# Patient Record
Sex: Male | Born: 1955 | Race: White | Hispanic: No | Marital: Married | Smoking: Never smoker
Health system: Southern US, Community
[De-identification: ages and names within clinical notes are randomized; demographics above are authoritative.]

---

## 2021-10-15 ENCOUNTER — Emergency Department (HOSPITAL_COMMUNITY): Payer: Medicare Other

## 2021-10-15 ENCOUNTER — Inpatient Hospital Stay (HOSPITAL_COMMUNITY)
Admission: EM | Admit: 2021-10-15 | Discharge: 2021-10-27 | DRG: 025 | Disposition: A | Discharge status: Rehabilitation Facility | Payer: Medicare Other | Attending: Internal Medicine | Admitting: Internal Medicine

## 2021-10-15 DIAGNOSIS — Z7951 Long term (current) use of inhaled steroids: Secondary | ICD-10-CM

## 2021-10-15 DIAGNOSIS — I480 Paroxysmal atrial fibrillation: Secondary | ICD-10-CM

## 2021-10-15 DIAGNOSIS — Z9861 Coronary angioplasty status: Secondary | ICD-10-CM

## 2021-10-15 DIAGNOSIS — Z6839 Body mass index (BMI) 39.0-39.9, adult: Secondary | ICD-10-CM

## 2021-10-15 DIAGNOSIS — G9389 Other specified disorders of brain: Secondary | ICD-10-CM | POA: Diagnosis not present

## 2021-10-15 DIAGNOSIS — R748 Abnormal levels of other serum enzymes: Secondary | ICD-10-CM | POA: Diagnosis present

## 2021-10-15 DIAGNOSIS — E119 Type 2 diabetes mellitus without complications: Secondary | ICD-10-CM

## 2021-10-15 DIAGNOSIS — R350 Frequency of micturition: Secondary | ICD-10-CM | POA: Diagnosis present

## 2021-10-15 DIAGNOSIS — G8929 Other chronic pain: Secondary | ICD-10-CM | POA: Diagnosis present

## 2021-10-15 DIAGNOSIS — Z7901 Long term (current) use of anticoagulants: Secondary | ICD-10-CM

## 2021-10-15 DIAGNOSIS — G40802 Other epilepsy, not intractable, without status epilepticus: Secondary | ICD-10-CM | POA: Diagnosis not present

## 2021-10-15 DIAGNOSIS — R410 Disorientation, unspecified: Secondary | ICD-10-CM

## 2021-10-15 DIAGNOSIS — E669 Obesity, unspecified: Secondary | ICD-10-CM | POA: Diagnosis present

## 2021-10-15 DIAGNOSIS — R4701 Aphasia: Secondary | ICD-10-CM | POA: Diagnosis not present

## 2021-10-15 DIAGNOSIS — I872 Venous insufficiency (chronic) (peripheral): Secondary | ICD-10-CM | POA: Diagnosis present

## 2021-10-15 DIAGNOSIS — C712 Malignant neoplasm of temporal lobe: Principal | ICD-10-CM | POA: Diagnosis present

## 2021-10-15 DIAGNOSIS — Z79899 Other long term (current) drug therapy: Secondary | ICD-10-CM

## 2021-10-15 DIAGNOSIS — I10 Essential (primary) hypertension: Secondary | ICD-10-CM

## 2021-10-15 DIAGNOSIS — G9341 Metabolic encephalopathy: Secondary | ICD-10-CM | POA: Diagnosis present

## 2021-10-15 DIAGNOSIS — Z91041 Radiographic dye allergy status: Secondary | ICD-10-CM

## 2021-10-15 DIAGNOSIS — Z7984 Long term (current) use of oral hypoglycemic drugs: Secondary | ICD-10-CM

## 2021-10-15 DIAGNOSIS — T380X5A Adverse effect of glucocorticoids and synthetic analogues, initial encounter: Secondary | ICD-10-CM | POA: Diagnosis present

## 2021-10-15 DIAGNOSIS — Z7902 Long term (current) use of antithrombotics/antiplatelets: Secondary | ICD-10-CM

## 2021-10-15 DIAGNOSIS — Z91013 Allergy to seafood: Secondary | ICD-10-CM

## 2021-10-15 DIAGNOSIS — Z91199 Patient's noncompliance with other medical treatment and regimen due to unspecified reason: Secondary | ICD-10-CM

## 2021-10-15 DIAGNOSIS — E876 Hypokalemia: Secondary | ICD-10-CM

## 2021-10-15 DIAGNOSIS — E1165 Type 2 diabetes mellitus with hyperglycemia: Secondary | ICD-10-CM | POA: Diagnosis present

## 2021-10-15 DIAGNOSIS — Z9981 Dependence on supplemental oxygen: Secondary | ICD-10-CM

## 2021-10-15 DIAGNOSIS — I2510 Atherosclerotic heart disease of native coronary artery without angina pectoris: Secondary | ICD-10-CM

## 2021-10-15 DIAGNOSIS — J449 Chronic obstructive pulmonary disease, unspecified: Secondary | ICD-10-CM

## 2021-10-15 DIAGNOSIS — J9611 Chronic respiratory failure with hypoxia: Secondary | ICD-10-CM | POA: Diagnosis present

## 2021-10-15 DIAGNOSIS — U071 COVID-19: Secondary | ICD-10-CM

## 2021-10-15 DIAGNOSIS — G40909 Epilepsy, unspecified, not intractable, without status epilepticus: Secondary | ICD-10-CM | POA: Diagnosis present

## 2021-10-15 DIAGNOSIS — Z79891 Long term (current) use of opiate analgesic: Secondary | ICD-10-CM

## 2021-10-15 DIAGNOSIS — R451 Restlessness and agitation: Secondary | ICD-10-CM

## 2021-10-15 DIAGNOSIS — R4182 Altered mental status, unspecified: Secondary | ICD-10-CM

## 2021-10-15 DIAGNOSIS — Z955 Presence of coronary angioplasty implant and graft: Secondary | ICD-10-CM

## 2021-10-15 DIAGNOSIS — R059 Cough, unspecified: Secondary | ICD-10-CM

## 2021-10-15 DIAGNOSIS — Z781 Physical restraint status: Secondary | ICD-10-CM

## 2021-10-15 DIAGNOSIS — E785 Hyperlipidemia, unspecified: Secondary | ICD-10-CM

## 2021-10-15 DIAGNOSIS — G936 Cerebral edema: Secondary | ICD-10-CM | POA: Diagnosis present

## 2021-10-15 DIAGNOSIS — R001 Bradycardia, unspecified: Secondary | ICD-10-CM | POA: Diagnosis present

## 2021-10-15 DIAGNOSIS — R600 Localized edema: Secondary | ICD-10-CM | POA: Diagnosis present

## 2021-10-15 DIAGNOSIS — Z86718 Personal history of other venous thrombosis and embolism: Secondary | ICD-10-CM

## 2021-10-15 DIAGNOSIS — G928 Other toxic encephalopathy: Secondary | ICD-10-CM | POA: Diagnosis present

## 2021-10-15 LAB — RAPID URINE DRUG SCREEN, HOSP PERFORMED
Amphetamines: NOT DETECTED
Barbiturates: NOT DETECTED
Benzodiazepines: NOT DETECTED
Cocaine: NOT DETECTED
Opiates: NOT DETECTED
Tetrahydrocannabinol: NOT DETECTED

## 2021-10-15 LAB — URINALYSIS, ROUTINE W REFLEX MICROSCOPIC
Bilirubin Urine: NEGATIVE
Glucose, UA: >=500 mg/dL — AB
Ketones, ur: NEGATIVE mg/dL
Leukocytes,Ua: NEGATIVE
Nitrite: NEGATIVE
Protein, ur: NEGATIVE mg/dL
Specific Gravity, Urine: 1.01 (ref 1.005–1.030)
pH: 6.5 (ref 5.0–8.0)

## 2021-10-15 LAB — I-STAT CHEM 8, ED
BUN: 17 mg/dL (ref 8–23)
Calcium, Ion: 1.15 mmol/L (ref 1.15–1.40)
Chloride: 99 mmol/L (ref 98–111)
Creatinine, Ser: 0.9 mg/dL (ref 0.61–1.24)
Glucose, Bld: 226 mg/dL — ABNORMAL HIGH (ref 70–99)
HCT: 41 % (ref 39.0–52.0)
Hemoglobin: 13.9 g/dL (ref 13.0–17.0)
Potassium: 4 mmol/L (ref 3.5–5.1)
Sodium: 138 mmol/L (ref 135–145)
TCO2: 31 mmol/L (ref 22–32)

## 2021-10-15 LAB — ETHANOL: Alcohol, Ethyl (B): <10 mg/dL (ref <10)

## 2021-10-15 LAB — URINALYSIS, MICROSCOPIC (REFLEX)
Bacteria, UA: NONE SEEN
Squamous Epithelial / HPF: NONE SEEN (ref 0–5)

## 2021-10-15 LAB — COMPREHENSIVE METABOLIC PANEL
ALT: 42 U/L (ref 0–44)
AST: 44 U/L — ABNORMAL HIGH (ref 15–41)
Albumin: 3.6 g/dL (ref 3.5–5.0)
Alkaline Phosphatase: 48 U/L (ref 38–126)
Anion gap: 10 (ref 5–15)
BUN: 15 mg/dL (ref 8–23)
CO2: 28 mmol/L (ref 22–32)
Calcium: 9.1 mg/dL (ref 8.9–10.3)
Chloride: 99 mmol/L (ref 98–111)
Creatinine, Ser: 0.91 mg/dL (ref 0.61–1.24)
GFR, Estimated: >60 mL/min (ref >60)
Glucose, Bld: 230 mg/dL — ABNORMAL HIGH (ref 70–99)
Potassium: 4 mmol/L (ref 3.5–5.1)
Sodium: 137 mmol/L (ref 135–145)
Total Bilirubin: 0.4 mg/dL (ref 0.3–1.2)
Total Protein: 7.2 g/dL (ref 6.5–8.1)

## 2021-10-15 LAB — APTT: aPTT: 33 seconds (ref 24–36)

## 2021-10-15 LAB — PROTIME-INR
INR: 1.2 (ref 0.8–1.2)
Prothrombin Time: 15 seconds (ref 11.4–15.2)

## 2021-10-15 MED ORDER — LEVETIRACETAM 500 MG PO TABS: 500.0000 mg | ORAL_TABLET | Freq: Two times a day (BID) | ORAL | Status: DC

## 2021-10-15 MED ORDER — SODIUM CHLORIDE 0.9 % IV SOLN
3000.0000 mg | Freq: Once | INTRAVENOUS | Status: AC
  Administered 2021-10-15: 3000 mg via INTRAVENOUS
  Filled 2021-10-15: qty 30

## 2021-10-15 MED ORDER — IOHEXOL 350 MG/ML SOLN
100.0000 mL | Freq: Once | INTRAVENOUS | Status: AC | PRN
  Administered 2021-10-15: 100 mL via INTRAVENOUS

## 2021-10-15 NOTE — ED Provider Notes (Signed)
[facility] EMERGENCY DEPARTMENT Provider Note   CSN: [id number] Arrival date & time: 10/15/21  2136     History  Chief Complaint  Patient presents with   Code Stroke    Eugune Wood. is a 66 y.o. male.  Level 5 caveat due to altered mental status.  Code stroke upon arrival.  Per EMS patient with difficulty with speech about an hour ago.  For some reason family gave the patient trazodone, narcotic, nitro but did not help.  Patient arrives sleepy and unable to participate in exam.  Wife was able to confirm same story upon arrival to the ED.  Patient on 2 L of oxygen.  Wife states that he was complaining of headaches as well.  Blood pressure was elevated with EMS.  The history is provided by the EMS personnel and a caregiver.  Neurologic Problem This is a new problem. The current episode started 1 to 2 hours ago. The problem occurs constantly. The problem has not changed since onset.Nothing aggravates the symptoms. Nothing relieves the symptoms. He has tried nothing for the symptoms. The treatment provided no relief.      Home Medications Prior to Admission medications   Medication Sig Start Date End Date Taking? Authorizing Provider  acetaminophen (TYLENOL) 500 MG tablet Take 500 mg by mouth every 6 (six) hours as needed for headache.   Yes [provider]  albuterol (VENTOLIN HFA) 108 (90 Base) MCG/ACT inhaler Inhale 1-2 puffs into the lungs every 6 (six) hours as needed for wheezing or shortness of breath.   Yes [provider]  apixaban (ELIQUIS) 5 MG TABS tablet Take 5 mg by mouth 2 (two) times daily.   Yes [provider]  atorvastatin (LIPITOR) 40 MG tablet Take 40 mg by mouth daily.   Yes [provider]  budesonide (PULMICORT) 0.5 MG/2ML nebulizer solution Take 0.5 mg by nebulization 2 (two) times daily as needed (wheezing).   Yes [provider]  Cholecalciferol (VITAMIN D3 GUMMIES PO) Take 2 tablets by  mouth every evening.   Yes [provider]  clopidogrel (PLAVIX) 75 MG tablet Take 75 mg by mouth daily.   Yes [provider]  famotidine (PEPCID) 20 MG tablet Take 20 mg by mouth daily.   Yes [provider]  fluticasone (FLONASE) 50 MCG/ACT nasal spray Place 2 sprays into both nostrils in the morning and at bedtime.   Yes [provider]  FLUTICASONE FUROATE-VILANTEROL IN Inhale 1 puff into the lungs daily.   Yes [provider]  Fluticasone-Umeclidin-Vilant (TRELEGY ELLIPTA IN) Inhale 1 puff into the lungs daily.   Yes [provider]  gabapentin (NEURONTIN) 300 MG capsule Take 300 mg by mouth 3 (three) times daily.   Yes [provider]  gemfibrozil (LOPID) 600 MG tablet Take 600 mg by mouth 2 (two) times daily before a meal.   Yes [provider]  glipiZIDE (GLUCOTROL XL) 5 MG 24 hr tablet Take 5 mg by mouth in the morning and at bedtime.   Yes [provider]  ipratropium-albuterol (DUONEB) 0.5-2.5 (3) MG/3ML SOLN Take 3 mLs by nebulization every 6 (six) hours as needed (shortness of breath).   Yes [provider]  isosorbide mononitrate (IMDUR) 30 MG 24 hr tablet Take 30 mg by mouth daily.   Yes [provider]  losartan-hydrochlorothiazide (HYZAAR) 100-12.5 MG tablet Take 1 tablet by mouth daily.   Yes [provider]  metoprolol tartrate (LOPRESSOR) 100 MG tablet  Take 100 mg by mouth 2 (two) times daily.   Yes [provider]  montelukast (SINGULAIR) 10 MG tablet Take 10 mg by mouth every evening.   Yes [provider]  Multiple Vitamins-Minerals (MENS MULTIVITAMIN PO) Take 1 tablet by mouth daily.   Yes [provider]  nitroGLYCERIN (NITROSTAT) 0.4 MG SL tablet Place 0.4 mg under the tongue every 5 (five) minutes as needed for chest pain.   Yes [provider]  oxyCODONE-acetaminophen (PERCOCET) 10-325 MG tablet Take 1 tablet by mouth 4 (four)  times daily as needed for pain.   Yes [provider]  pantoprazole (PROTONIX) 40 MG tablet Take 40 mg by mouth daily.   Yes [provider]  tiZANidine (ZANAFLEX) 4 MG tablet Take 4 mg by mouth every 8 (eight) hours as needed for muscle spasms.   Yes [provider]  torsemide (DEMADEX) 20 MG tablet Take 20 mg by mouth daily.   Yes [provider]  traZODone (DESYREL) 100 MG tablet Take 200 mg by mouth at bedtime as needed for sleep.   Yes [provider]      Allergies    Iodine and Shellfish allergy    Review of Systems   Review of Systems  Physical Exam Updated Vital Signs BP (!) 147/74    Pulse 79    Temp 97.8 F (36.6 C) (Oral)    Resp 17    Wt 123.6 kg    SpO2 97%  Physical Exam Vitals and nursing note reviewed.  Constitutional:      General: He is not in acute distress.    Appearance: He is well-developed. He is ill-appearing.  HENT:     Head: Normocephalic and atraumatic.     Nose: Nose normal.  Eyes:     Extraocular Movements: Extraocular movements intact.     Conjunctiva/sclera: Conjunctivae normal.     Pupils: Pupils are equal, round, and reactive to light.  Cardiovascular:     Rate and Rhythm: Normal rate and regular rhythm.     Pulses: Normal pulses.     Heart sounds: Normal heart sounds. No murmur heard. Pulmonary:     Effort: Pulmonary effort is normal. No respiratory distress.     Breath sounds: Normal breath sounds.  Abdominal:     Palpations: Abdomen is soft.     Tenderness: There is no abdominal tenderness.  Musculoskeletal:        General: No swelling.     Cervical back: Normal range of motion and neck supple.  Skin:    General: Skin is warm and dry.     Capillary Refill: Capillary refill takes less than 2 seconds.  Neurological:     General: No focal deficit present.     Mental Status: He is alert.     Comments: Patient appears to move all extremities to noxious stimuli, easily arousable, unable to  follow commands, pupils are equal and reactive  Psychiatric:        Mood and Affect: Mood normal.    ED Results / Procedures / Treatments   Labs (all labs ordered are listed, but only abnormal results are displayed) Labs Reviewed  DIFFERENTIAL - Abnormal; Notable for the following components:      Result Value   Abs Immature Granulocytes 0.09 (*)    All other components within normal limits  COMPREHENSIVE METABOLIC PANEL - Abnormal; Notable for the following components:   Glucose, Bld 230 (*)    AST 44 (*)  All other components within normal limits  URINALYSIS, ROUTINE W REFLEX MICROSCOPIC - Abnormal; Notable for the following components:   Color, Urine STRAW (*)    Glucose, UA >=500 (*)    Hgb urine dipstick TRACE (*)    All other components within normal limits  I-STAT CHEM 8, ED - Abnormal; Notable for the following components:   Glucose, Bld 226 (*)    All other components within normal limits  RESP PANEL BY RT-PCR (FLU A&B, COVID) ARPGX2  ETHANOL  PROTIME-INR  APTT  CBC  RAPID URINE DRUG SCREEN, HOSP PERFORMED  URINALYSIS, MICROSCOPIC (REFLEX)    EKG EKG Interpretation  Date/Time:  Thursday October 15 2021 22:33:14 EST Ventricular Rate:  84 PR Interval:  208 QRS Duration: 90 QT Interval:  384 QTC Calculation: 454 R Axis:   -64 Text Interpretation: Sinus rhythm Inferior infarct, old Consider anterior infarct Baseline wander in lead(s) V1 Confirmed by Lennice Sites ([id number]) on 10/15/2021 11:41:33 PM  Radiology CT CEREBRAL PERFUSION W CONTRAST  Result Date: 10/15/2021 CLINICAL DATA:  Neuro deficit, acute, stroke suspected. EXAM: CT ANGIOGRAPHY HEAD AND NECK CT PERFUSION BRAIN TECHNIQUE: Multidetector CT imaging of the head and neck was performed using the standard protocol during bolus administration of intravenous contrast. Multiplanar CT image reconstructions and MIPs were obtained to evaluate the vascular anatomy. Carotid stenosis measurements (when applicable)  are obtained utilizing NASCET criteria, using the distal internal carotid diameter as the denominator. Multiphase CT imaging of the brain was performed following IV bolus contrast injection. Subsequent parametric perfusion maps were calculated using RAPID software. RADIATION DOSE REDUCTION: This exam was performed according to the departmental dose-optimization program which includes automated exposure control, adjustment of the mA and/or kV according to patient size and/or use of iterative reconstruction technique. CONTRAST:  Not annotated COMPARISON:  Head CT earlier same day FINDINGS: CTA NECK FINDINGS Aortic arch: Aortic atherosclerosis, mild. Right carotid system: Common carotid artery widely patent to the bifurcation. Carotid bifurcation is normal. Left carotid system: Common carotid artery widely patent to the bifurcation. Carotid bifurcation is normal. Vertebral arteries: Vertebral artery origins are poorly seen. Beyond the origins, the vessels are patent through the cervical region to the foramen magnum. Skeleton: Ordinary cervical spondylosis. Other neck: No mass or lymphadenopathy. Upper chest: Small amount of dependent pleural fluid and dependent pulmonary atelectasis. Review of the MIP images confirms the above findings CTA HEAD FINDINGS Anterior circulation: Both internal carotid arteries widely patent through the skull base and siphon regions. The anterior and middle cerebral vessels are patent. No large vessel occlusion. Posterior circulation: Both vertebral arteries are widely patent to the basilar. No basilar stenosis. No large vessel occlusion. Venous sinuses: Patent and normal. Anatomic variants: None significant. Review of the MIP images confirms the above findings CT Brain Perfusion Findings: ASPECTS: 9 CBF (<30%) Volume: 46mL Perfusion (Tmax>6.0s) volume: 31mL Mismatch Volume: 79mL Infarction Location:None There is actual hyperemia in the left posterior temporal lobe, with a peripherally  enhancing mass lesion visible, measuring about 2.5 cm in size. IMPRESSION: No large vessel occlusion. 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 Electronically Signed   By: Nelson Chimes M.D.   On: 10/15/2021 22:35   CT HEAD CODE STROKE WO CONTRAST  Result Date: 10/15/2021 CLINICAL DATA:  Code stroke. Neuro deficit, acute, stroke suspected. EXAM: CT HEAD WITHOUT CONTRAST TECHNIQUE: Contiguous axial images were obtained from the base of the skull through the vertex without intravenous contrast. RADIATION DOSE REDUCTION: This exam  was performed according to the departmental dose-optimization program which includes automated exposure control, adjustment of the mA and/or kV according to patient size and/or use of iterative reconstruction technique. COMPARISON:  None. FINDINGS: Brain: The brainstem and cerebellum are normal. Right cerebral hemisphere is normal. There is a 3-4 cm region of abnormal low-density and mild swelling in the left mid to posterior temporal lobe. This could represent a early subacute infarction mass lesion is not excluded. There may be some internal petechial blood products but there is no frank hematoma. Elsewhere the brain appears normal. No hydrocephalus or extra-axial collection. Vascular: No abnormal vascular finding. Skull: Negative Sinuses/Orbits: Clear/normal Other: None ASPECTS (Hanson Stroke Program Early CT Score) - Ganglionic level infarction (caudate, lentiform nuclei, internal capsule, insula, M1-M3 cortex): 6 - Supraganglionic infarction (M4-M6 cortex): 3 Total score (0-10 with 10 being normal): 9 IMPRESSION: 1. Question 3-4 cm region of subacute infarction versus possibly a mass lesion in the left posterior temporal lobe. There may be some internal petechial blood products but there is no frank hematoma. 2. ASPECTS is 9 3. Case discussed by telephone with Dr. Lorrin Goodell at 2200 hours. Electronically Signed   By: Nelson Chimes M.D.   On:  10/15/2021 22:06   CT ANGIO HEAD NECK W WO CM (CODE STROKE)  Result Date: 10/15/2021 CLINICAL DATA:  Neuro deficit, acute, stroke suspected. EXAM: CT ANGIOGRAPHY HEAD AND NECK CT PERFUSION BRAIN TECHNIQUE: Multidetector CT imaging of the head and neck was performed using the standard protocol during bolus administration of intravenous contrast. Multiplanar CT image reconstructions and MIPs were obtained to evaluate the vascular anatomy. Carotid stenosis measurements (when applicable) are obtained utilizing NASCET criteria, using the distal internal carotid diameter as the denominator. Multiphase CT imaging of the brain was performed following IV bolus contrast injection. Subsequent parametric perfusion maps were calculated using RAPID software. RADIATION DOSE REDUCTION: This exam was performed according to the departmental dose-optimization program which includes automated exposure control, adjustment of the mA and/or kV according to patient size and/or use of iterative reconstruction technique. CONTRAST:  Not annotated COMPARISON:  Head CT earlier same day FINDINGS: CTA NECK FINDINGS Aortic arch: Aortic atherosclerosis, mild. Right carotid system: Common carotid artery widely patent to the bifurcation. Carotid bifurcation is normal. Left carotid system: Common carotid artery widely patent to the bifurcation. Carotid bifurcation is normal. Vertebral arteries: Vertebral artery origins are poorly seen. Beyond the origins, the vessels are patent through the cervical region to the foramen magnum. Skeleton: Ordinary cervical spondylosis. Other neck: No mass or lymphadenopathy. Upper chest: Small amount of dependent pleural fluid and dependent pulmonary atelectasis. Review of the MIP images confirms the above findings CTA HEAD FINDINGS Anterior circulation: Both internal carotid arteries widely patent through the skull base and siphon regions. The anterior and middle cerebral vessels are patent. No large vessel  occlusion. Posterior circulation: Both vertebral arteries are widely patent to the basilar. No basilar stenosis. No large vessel occlusion. Venous sinuses: Patent and normal. Anatomic variants: None significant. Review of the MIP images confirms the above findings CT Brain Perfusion Findings: ASPECTS: 9 CBF (<30%) Volume: 65mL Perfusion (Tmax>6.0s) volume: 67mL Mismatch Volume: 22mL Infarction Location:None There is actual hyperemia in the left posterior temporal lobe, with a peripherally enhancing mass lesion visible, measuring about 2.5 cm in size. IMPRESSION: No large vessel occlusion. 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 Electronically Signed   By: Nelson Chimes M.D.   On: 10/15/2021 22:35  Procedures .Critical Care Performed by: Lennice Sites, DO Authorized by: Lennice Sites, DO   Critical care provider statement:    Critical care time (minutes):  45   Critical care was necessary to treat or prevent imminent or life-threatening deterioration of the following conditions:  CNS failure or compromise   Critical care was time spent personally by me on the following activities:  Blood draw for specimens, development of treatment plan with patient or surrogate, discussions with consultants, discussions with primary provider, evaluation of patient's response to treatment, examination of patient, obtaining history from patient or surrogate, ordering and performing treatments and interventions, ordering and review of laboratory studies, ordering and review of radiographic studies, pulse oximetry, re-evaluation of patient's condition and review of old charts   Care discussed with: admitting provider      Medications Ordered in ED Medications  levETIRAcetam (KEPPRA) tablet 500 mg (has no administration in time range)  levETIRAcetam (KEPPRA) 3,000 mg in sodium chloride 0.9 % 250 mL IVPB (0 mg Intravenous Stopped 10/15/21 2336)  iohexol (OMNIPAQUE) 350 MG/ML  injection 100 mL (100 mLs Intravenous Contrast Given 10/15/21 2204)    ED Course/ Medical Decision Making/ A&P                           Medical Decision Making Risk Decision regarding hospitalization.   Artie Earmon Sherrow Sr. is a 66 year old male with history of CAD on Plavix, blood clot in his leg on Eliquis, hypertension, high cholesterol, diabetes.  Arrives as a code stroke for aphasia that started about an hour ago.  This is somewhat complicated by that patient took trazodone, narcotic pain medicine and nitroglycerin shortly after experiencing the symptoms.  Patient is somnolent but easily arousable.  He is moves all his extremities.  Pupils are equal and reactive.  Opens eyes spontaneously.  Unable to fully participate in neurological exam.  Neurology at the bedside upon arrival to the ED.  Taken directly for head CT.  Per review of CT, I do not see any head bleed however per radiology report there is questionable 3 to 4 cm region of subacute infarction versus possibly a mass lesion in the left posterior temporal lobe.  No frank hematoma.On CT perfusion study there is hyperemic mass in the left posterior temporal lobe measuring about 2.5 cm per radiology report.  Differential diagnosis now includes possible brain mass versus seizure episode.  Lab work including CBC, BMP, EKG has been ordered.  Neurology not recommending tPA given these findings.  Patient to be loaded with IV Keppra for possible seizure.  Does not appear to be having any seizure activity now but possibly that he is postictal.  We will get MRI to furtherInvestigate and admit to medicine for further care.  My review and interpretation the labs show that there are no significant findings.  Mild elevation in blood sugar but no significant anemia, electrolyte abnormality, kidney injury.  My review and interpretation of EKG shows sinus rhythm.  No ischemic changes.  Improving while on Keppra.  No seizure-like activities.  Becoming more  awake but still has some speech difficulty.  To be admitted to medicine for further care.  This chart was dictated using voice recognition software.  Despite best efforts to proofread,  errors can occur which can change the documentation meaning.         Final Clinical Impression(s) / ED Diagnoses Final diagnoses:  Brain mass  Aphasia  Altered mental status, unspecified altered  mental status type    Rx / DC Orders ED Discharge Orders     None         Lennice Sites, DO 10/16/21 3552

## 2021-10-15 NOTE — Code Documentation (Signed)
Stroke Response Nurse Documentation Code Documentation  Lavell Supple Sr. is a 66 y.o. male arriving to [facility] ED via [facility] on 2/16 with past medical hx of large clot in leg. On clopidogrel 75 mg daily and Eliquis (apixaban) daily. Code stroke was activated by EMS.   Patient from home where he was LKW at [place] and now complaining of aphasia .  Stroke team at the bedside on patient arrival. Labs drawn and patient cleared for CT by Dr. Ernst Bowler. Patient to CT with team. NIHSS 22, see documentation for details and code stroke times. Patient with decreased LOC, disoriented, not following commands, bilateral arm weakness, bilateral leg weakness, Global aphasia , and dysarthria  on exam. The following imaging was completed:  CT, CTA head and neck and CTP. Patient is not a candidate for IV Thrombolytic due to Eliquis. Patient is not a candidate for IR due to No LVO.    Bedside handoff with ED RN Hennie Duos.    Madelynn Done  Rapid Response RN

## 2021-10-15 NOTE — Consult Note (Addendum)
NEUROLOGY CONSULTATION NOTE   Date of service: October 15, 2021 Patient Name: Jeff Breach Sr. MRN:  [id number] DOB:  1956-08-26 Reason for consult: "aphasia and somnolence" Requesting Provider: Lennice Sites, DO _ _ _   _ __   _ __ _ _  __ __   _ __   __ _  History of Present Illness  Jeff Schue Sr. is a 66 y.o. 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  Patient is unable to provide any meaningful history secondary to somnolence.  Patient's wife who I spoke to over phone reports that patient was doing fine at 1900 on 10/15/2021.  Out of nowhere, he appeared to be having trouble with language.  He appeared very frustrated and was unable to speak, getting stuck on several words.  This happened around [place] on 10/15/2021.  He was also feeling very off.  She gave him nitro thinking this is coming from his heart.  Patient asked for some water but had a lot of trouble getting his words out.  While wife went to get some water, he took 2 trazodone and also maybe a Percocet.  Wife called EMS when patient was very somnolent and he was brought in as a stroke code.  On arrival, patient is somnolent, responds to with any question and requires a sternal rub to stay awake.  CT head with a subacute infarct versus possible mass in the left posterior temporal lobe.  CT angio with no large vessel occlusion and on CT perfusion, noted to have a hyperemic mass in the left posterior temporal lobe measuring about 2.5 cm in size.  LKW: 1947 on 10/15/21 mRS: 0 tNKASE: not offered, he is on Eliquis and took it this morning. Thrombectomy: not offered, no LVO. NIHSS components Score: Comment  1a Level of Conscious 0[]  1[]  2[]  3[x]      1b LOC Questions 0[]  1[]  2[x]       1c LOC Commands 0[]  1[]  2[x]       2 Best Gaze 0[x]  1[]  2[]       3 Visual 0[x]  1[]  2[]  3[]      4 Facial Palsy 0[x]  1[]  2[]  3[]      5a  Motor Arm - left 0[]  1[]  2[x]  3[]  4[]  UN[]    5b Motor Arm - Right 0[]  1[]  2[x]  3[]  4[]  UN[]    6a Motor Leg - Left 0[]  1[]  2[]  3[x]  4[]  UN[]    6b Motor Leg - Right 0[]  1[]  2[]  3[x]  4[]  UN[]    7 Limb Ataxia 0[x]  1[]  2[]  3[]  UN[]     8 Sensory 0[x]  1[]  2[]  UN[]      9 Best Language 0[]  1[]  2[]  3[x]      10 Dysarthria 0[]  1[]  2[x]  UN[]      11 Extinct. and Inattention 0[x]  1[]  2[]       TOTAL: 22     ROS   Unable to obtain detailed review of systems secondary to somnolence and encephalopathy.  Past History  No past medical history on file.  No family history on file. Social History   Socioeconomic History   Marital status: Not on file    Spouse name: Not on file   Number of children: Not on file   Years of education: Not on file   Highest education level: Not on file  Occupational History   Not on file  Tobacco Use   Smoking status: Not on file   Smokeless tobacco:  Not on file  Substance and Sexual Activity   Alcohol use: Not on file   Drug use: Not on file   Sexual activity: Not on file  Other Topics Concern   Not on file  Social History Narrative   Not on file   Social Determinants of Health   Financial Resource Strain: Not on file  Food Insecurity: Not on file  Transportation Needs: Not on file  Physical Activity: Not on file  Stress: Not on file  Social Connections: Not on file   Allergies  Allergen Reactions   Iodine Anaphylaxis   Shellfish Allergy Anaphylaxis    Medications  (Not in a hospital admission)    Vitals   Vitals:   10/15/21 2100 10/15/21 2245 10/15/21 2247  BP:  (!) 167/84 (!) 167/84  Pulse:  83 83  Resp:  13 13  Temp:   97.8 F (36.6 C)  TempSrc:   Oral  SpO2:  97% 97%  Weight: 123.6 kg       There is no height or weight on file to calculate BMI.  Physical Exam   General: Laying comfortably in bed; in no acute distress.  HENT: Normal oropharynx and mucosa. Normal external appearance of ears and nose.  Neck: Supple, no pain or  tenderness  CV: No JVD. No peripheral edema.  Pulmonary: Symmetric Chest rise. Normal respiratory effort.  Abdomen: Soft to touch, non-tender.  Ext: No cyanosis, edema, or deformity  Skin: No rash. Normal palpation of skin.   Musculoskeletal: Normal digits and nails by inspection. No clubbing.   Neurologic Examination  Mental status/Cognition: Somnolent, briefly opens eyes for a couple seconds to tactile stimulation or to sternal rub.  Does not respond to orientation questions and very poor attention. Speech/language: responds with yeah and okay to any question. Somnolence precludes detailed speech evaluation. Cranial nerves:   CN II Pupils equal and reactive to light, unable to assess for visual field deficit.   CN III,IV,VI EOM intact to dolls eyes, no gaze preference or deviation, no nystagmus   CN V Corneals intact BL   CN VII no asymmetry, no nasolabial fold flattening   CN VIII Does not turn head to speech   CN IX & X Unable to assess.,   CN XI Head is midline   CN XII midline tongue but does not protrude on command.   Motor/sensory:  Muscle bulk: normal, tone normal  Unable to do detailed strength testing secondary to somnolence.  He is spontaneously moving all arms and legs and localizes in bilateral upper extremity and withdraws to Babinski in bilateral lower extremities.  Reflexes:  Right Left Comments  Pectoralis      Biceps (C5/6) 1 1 Reflexes were limited by body habitus  Brachioradialis (C5/6) 1 1    Triceps (C6/7) 1 1    Patellar (L3/4) 1 1    Achilles (S1)      Hoffman      Plantar     Jaw jerk    Coordination/Complex Motor:  Unable to assess with no obvious ataxia noted. - Gait: Deferred for patient safety.  Labs   CBC:  Recent Labs  Lab 10/15/21 2148  HGB 13.9  HCT 82.9    Basic Metabolic Panel:  Lab Results  Component Value Date   NA 138 10/15/2021   K 4.0 10/15/2021   CO2 28 10/15/2021   GLUCOSE 226 (H) 10/15/2021   BUN 17 10/15/2021    CREATININE 0.90 10/15/2021   CALCIUM 9.1 10/15/2021  GFRNONAA >60 10/15/2021   Lipid Panel: No results found for: LDLCALC HgbA1c: No results found for: HGBA1C Urine Drug Screen: No results found for: LABOPIA, COCAINSCRNUR, LABBENZ, AMPHETMU, THCU, LABBARB  Alcohol Level     Component Value Date/Time   ETH <10 10/15/2021 2138    CT Head without contrast(Personally reviewed): Left posterior temporal lobe subacute infarct versus mass.  CT angio Head and Neck with contrast(Personally reviewed): No LVO  CT perfusion [personally reviewed]: The noted left posterior temporal lobe lesion appears hyperemic.  MRI Brain: pending  rEEG:  pending  Impression   Jeff Selsor Sr. is a 66 y.o. 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 Found to have a hyperemic left posterior temporal lobe lesion concerning for a potential mass. I suspect that he probably had epileptic aphasia vs seizure. He did take trazodone x 2 and percocet but could also be post ictal. No obvious clinical seizure on my evaluation but I will load him up with Keppra and get a MRI brain with and without contrast and routine EEG for further evaluation.  Impression: - Brain mass - Epileptic aphasia.  Recommendations  - MRI Brain with and without contrast - routine EEG - Keppra 3gram IV once(20mg /Kg load), followed by Keppra 500mg  BID - Neurology will continue to follow along.  Plan discussed with Dr. Ronnald Nian. ______________________________________________________________________  This patient is critically ill and at significant risk of neurological worsening, death and care requires constant monitoring of vital signs, hemodynamics,respiratory and cardiac monitoring, neurological assessment, discussion with family, other specialists and medical decision making of high complexity. I spent 35 minutes of  neurocritical care time  in the care of  this patient. This was time spent independent of any time provided by nurse practitioner or PA.  Donnetta Simpers [facility] Pager Number [phone] 10/15/2021  11:14 PM   Thank you for the opportunity to take part in the care of this patient. If you have any further questions, please contact the neurology consultation attending.  Signed,  [place] Pager Number [phone] _ _ _   _ __   _ __ _ _  __ __   _ __   __ _

## 2021-10-16 ENCOUNTER — Observation Stay (HOSPITAL_COMMUNITY): Payer: Medicare Other

## 2021-10-16 ENCOUNTER — Encounter (HOSPITAL_COMMUNITY): Payer: Self-pay | Admitting: Internal Medicine

## 2021-10-16 ENCOUNTER — Inpatient Hospital Stay (HOSPITAL_COMMUNITY): Payer: Medicare Other

## 2021-10-16 ENCOUNTER — Other Ambulatory Visit: Payer: Self-pay

## 2021-10-16 DIAGNOSIS — E785 Hyperlipidemia, unspecified: Secondary | ICD-10-CM | POA: Diagnosis present

## 2021-10-16 DIAGNOSIS — I10 Essential (primary) hypertension: Secondary | ICD-10-CM

## 2021-10-16 DIAGNOSIS — Z781 Physical restraint status: Secondary | ICD-10-CM | POA: Diagnosis not present

## 2021-10-16 DIAGNOSIS — E119 Type 2 diabetes mellitus without complications: Secondary | ICD-10-CM

## 2021-10-16 DIAGNOSIS — G936 Cerebral edema: Secondary | ICD-10-CM | POA: Diagnosis present

## 2021-10-16 DIAGNOSIS — I480 Paroxysmal atrial fibrillation: Secondary | ICD-10-CM | POA: Diagnosis present

## 2021-10-16 DIAGNOSIS — Z48811 Encounter for surgical aftercare following surgery on the nervous system: Secondary | ICD-10-CM | POA: Diagnosis present

## 2021-10-16 DIAGNOSIS — U071 COVID-19: Secondary | ICD-10-CM | POA: Diagnosis present

## 2021-10-16 DIAGNOSIS — R4182 Altered mental status, unspecified: Secondary | ICD-10-CM

## 2021-10-16 DIAGNOSIS — Z66 Do not resuscitate: Secondary | ICD-10-CM | POA: Diagnosis not present

## 2021-10-16 DIAGNOSIS — Z7189 Other specified counseling: Secondary | ICD-10-CM | POA: Diagnosis not present

## 2021-10-16 DIAGNOSIS — R001 Bradycardia, unspecified: Secondary | ICD-10-CM | POA: Diagnosis present

## 2021-10-16 DIAGNOSIS — G441 Vascular headache, not elsewhere classified: Secondary | ICD-10-CM | POA: Diagnosis not present

## 2021-10-16 DIAGNOSIS — Z7902 Long term (current) use of antithrombotics/antiplatelets: Secondary | ICD-10-CM | POA: Diagnosis not present

## 2021-10-16 DIAGNOSIS — R531 Weakness: Secondary | ICD-10-CM | POA: Diagnosis not present

## 2021-10-16 DIAGNOSIS — I2510 Atherosclerotic heart disease of native coronary artery without angina pectoris: Secondary | ICD-10-CM | POA: Diagnosis present

## 2021-10-16 DIAGNOSIS — C719 Malignant neoplasm of brain, unspecified: Secondary | ICD-10-CM | POA: Diagnosis present

## 2021-10-16 DIAGNOSIS — E1169 Type 2 diabetes mellitus with other specified complication: Secondary | ICD-10-CM | POA: Diagnosis not present

## 2021-10-16 DIAGNOSIS — G9341 Metabolic encephalopathy: Secondary | ICD-10-CM | POA: Diagnosis not present

## 2021-10-16 DIAGNOSIS — Z7951 Long term (current) use of inhaled steroids: Secondary | ICD-10-CM | POA: Diagnosis not present

## 2021-10-16 DIAGNOSIS — E1165 Type 2 diabetes mellitus with hyperglycemia: Secondary | ICD-10-CM | POA: Diagnosis present

## 2021-10-16 DIAGNOSIS — J9611 Chronic respiratory failure with hypoxia: Secondary | ICD-10-CM | POA: Diagnosis present

## 2021-10-16 DIAGNOSIS — Z9981 Dependence on supplemental oxygen: Secondary | ICD-10-CM | POA: Diagnosis not present

## 2021-10-16 DIAGNOSIS — I872 Venous insufficiency (chronic) (peripheral): Secondary | ICD-10-CM | POA: Diagnosis present

## 2021-10-16 DIAGNOSIS — G479 Sleep disorder, unspecified: Secondary | ICD-10-CM | POA: Diagnosis not present

## 2021-10-16 DIAGNOSIS — N390 Urinary tract infection, site not specified: Secondary | ICD-10-CM | POA: Diagnosis not present

## 2021-10-16 DIAGNOSIS — G928 Other toxic encephalopathy: Secondary | ICD-10-CM | POA: Diagnosis present

## 2021-10-16 DIAGNOSIS — Z955 Presence of coronary angioplasty implant and graft: Secondary | ICD-10-CM | POA: Diagnosis not present

## 2021-10-16 DIAGNOSIS — C712 Malignant neoplasm of temporal lobe: Secondary | ICD-10-CM | POA: Diagnosis present

## 2021-10-16 DIAGNOSIS — T380X5A Adverse effect of glucocorticoids and synthetic analogues, initial encounter: Secondary | ICD-10-CM | POA: Diagnosis present

## 2021-10-16 DIAGNOSIS — G40909 Epilepsy, unspecified, not intractable, without status epilepticus: Secondary | ICD-10-CM | POA: Diagnosis present

## 2021-10-16 DIAGNOSIS — R410 Disorientation, unspecified: Secondary | ICD-10-CM | POA: Diagnosis not present

## 2021-10-16 DIAGNOSIS — R4782 Fluency disorder in conditions classified elsewhere: Secondary | ICD-10-CM | POA: Diagnosis not present

## 2021-10-16 DIAGNOSIS — Z6839 Body mass index (BMI) 39.0-39.9, adult: Secondary | ICD-10-CM | POA: Diagnosis not present

## 2021-10-16 DIAGNOSIS — Z86718 Personal history of other venous thrombosis and embolism: Secondary | ICD-10-CM

## 2021-10-16 DIAGNOSIS — Z91013 Allergy to seafood: Secondary | ICD-10-CM | POA: Diagnosis not present

## 2021-10-16 DIAGNOSIS — J449 Chronic obstructive pulmonary disease, unspecified: Secondary | ICD-10-CM | POA: Diagnosis present

## 2021-10-16 DIAGNOSIS — G8929 Other chronic pain: Secondary | ICD-10-CM | POA: Diagnosis present

## 2021-10-16 DIAGNOSIS — R7989 Other specified abnormal findings of blood chemistry: Secondary | ICD-10-CM | POA: Diagnosis not present

## 2021-10-16 DIAGNOSIS — E669 Obesity, unspecified: Secondary | ICD-10-CM | POA: Diagnosis present

## 2021-10-16 DIAGNOSIS — Z6836 Body mass index (BMI) 36.0-36.9, adult: Secondary | ICD-10-CM | POA: Diagnosis not present

## 2021-10-16 DIAGNOSIS — Z91199 Patient's noncompliance with other medical treatment and regimen due to unspecified reason: Secondary | ICD-10-CM | POA: Diagnosis not present

## 2021-10-16 DIAGNOSIS — Z888 Allergy status to other drugs, medicaments and biological substances status: Secondary | ICD-10-CM | POA: Diagnosis not present

## 2021-10-16 DIAGNOSIS — D496 Neoplasm of unspecified behavior of brain: Secondary | ICD-10-CM | POA: Diagnosis not present

## 2021-10-16 DIAGNOSIS — F05 Delirium due to known physiological condition: Secondary | ICD-10-CM | POA: Diagnosis not present

## 2021-10-16 DIAGNOSIS — E876 Hypokalemia: Secondary | ICD-10-CM | POA: Diagnosis present

## 2021-10-16 DIAGNOSIS — R4701 Aphasia: Secondary | ICD-10-CM | POA: Diagnosis present

## 2021-10-16 DIAGNOSIS — F1120 Opioid dependence, uncomplicated: Secondary | ICD-10-CM | POA: Diagnosis present

## 2021-10-16 DIAGNOSIS — G9389 Other specified disorders of brain: Secondary | ICD-10-CM | POA: Diagnosis not present

## 2021-10-16 DIAGNOSIS — Z515 Encounter for palliative care: Secondary | ICD-10-CM | POA: Diagnosis not present

## 2021-10-16 DIAGNOSIS — R482 Apraxia: Secondary | ICD-10-CM | POA: Diagnosis present

## 2021-10-16 DIAGNOSIS — Z7901 Long term (current) use of anticoagulants: Secondary | ICD-10-CM | POA: Diagnosis not present

## 2021-10-16 LAB — CBC
HCT: 43.4 % (ref 39.0–52.0)
Hemoglobin: 14 g/dL (ref 13.0–17.0)
MCH: 32.1 pg (ref 26.0–34.0)
MCHC: 32.3 g/dL (ref 30.0–36.0)
MCV: 99.5 fL (ref 80.0–100.0)
Platelets: 166 10*3/uL (ref 150–400)
RBC: 4.36 MIL/uL (ref 4.22–5.81)
RDW: 13.6 % (ref 11.5–15.5)
WBC: 4.7 10*3/uL (ref 4.0–10.5)
nRBC: 0 % (ref 0.0–0.2)

## 2021-10-16 LAB — HEMOGLOBIN A1C
Hgb A1c MFr Bld: 9.5 % — ABNORMAL HIGH (ref 4.8–5.6)
Mean Plasma Glucose: 225.95 mg/dL

## 2021-10-16 LAB — DIFFERENTIAL
Abs Immature Granulocytes: 0.09 10*3/uL — ABNORMAL HIGH (ref 0.00–0.07)
Basophils Absolute: 0 10*3/uL (ref 0.0–0.1)
Basophils Relative: 1 %
Eosinophils Absolute: 0.2 10*3/uL (ref 0.0–0.5)
Eosinophils Relative: 4 %
Immature Granulocytes: 2 %
Lymphocytes Relative: 24 %
Lymphs Abs: 1.1 10*3/uL (ref 0.7–4.0)
Monocytes Absolute: 0.7 10*3/uL (ref 0.1–1.0)
Monocytes Relative: 14 %
Neutro Abs: 2.6 10*3/uL (ref 1.7–7.7)
Neutrophils Relative %: 55 %

## 2021-10-16 LAB — LACTATE DEHYDROGENASE: LDH: 154 U/L (ref 98–192)

## 2021-10-16 LAB — RESP PANEL BY RT-PCR (FLU A&B, COVID) ARPGX2
Influenza A by PCR: NEGATIVE
Influenza B by PCR: NEGATIVE
SARS Coronavirus 2 by RT PCR: POSITIVE — AB

## 2021-10-16 LAB — FIBRINOGEN: Fibrinogen: 419 mg/dL (ref 210–475)

## 2021-10-16 LAB — CBG MONITORING, ED
Glucose-Capillary: 265 mg/dL — ABNORMAL HIGH (ref 70–99)
Glucose-Capillary: 161 mg/dL — ABNORMAL HIGH (ref 70–99)
Glucose-Capillary: 288 mg/dL — ABNORMAL HIGH (ref 70–99)
Glucose-Capillary: 238 mg/dL — ABNORMAL HIGH (ref 70–99)

## 2021-10-16 LAB — GLUCOSE, CAPILLARY: Glucose-Capillary: 231 mg/dL — ABNORMAL HIGH (ref 70–99)

## 2021-10-16 LAB — HIV ANTIBODY (ROUTINE TESTING W REFLEX): HIV Screen 4th Generation wRfx: NONREACTIVE

## 2021-10-16 LAB — TROPONIN I (HIGH SENSITIVITY)
Troponin I (High Sensitivity): 8 ng/L (ref <18)
Troponin I (High Sensitivity): 8 ng/L (ref <18)

## 2021-10-16 LAB — FERRITIN: Ferritin: 42 ng/mL (ref 24–336)

## 2021-10-16 LAB — C-REACTIVE PROTEIN: CRP: 0.8 mg/dL (ref <1.0)

## 2021-10-16 LAB — D-DIMER, QUANTITATIVE: D-Dimer, Quant: <0.27 ug/mL-FEU (ref 0.00–0.50)

## 2021-10-16 LAB — TSH: TSH: 1.798 u[IU]/mL (ref 0.350–4.500)

## 2021-10-16 IMAGING — DX DG CHEST 1V PORT
1 series · 1 of 1 positions shown · non-contrast
Comparison: None.

CLINICAL DATA: [4J] infection.

EXAM:
PORTABLE CHEST 1 VIEW

[chest ap]
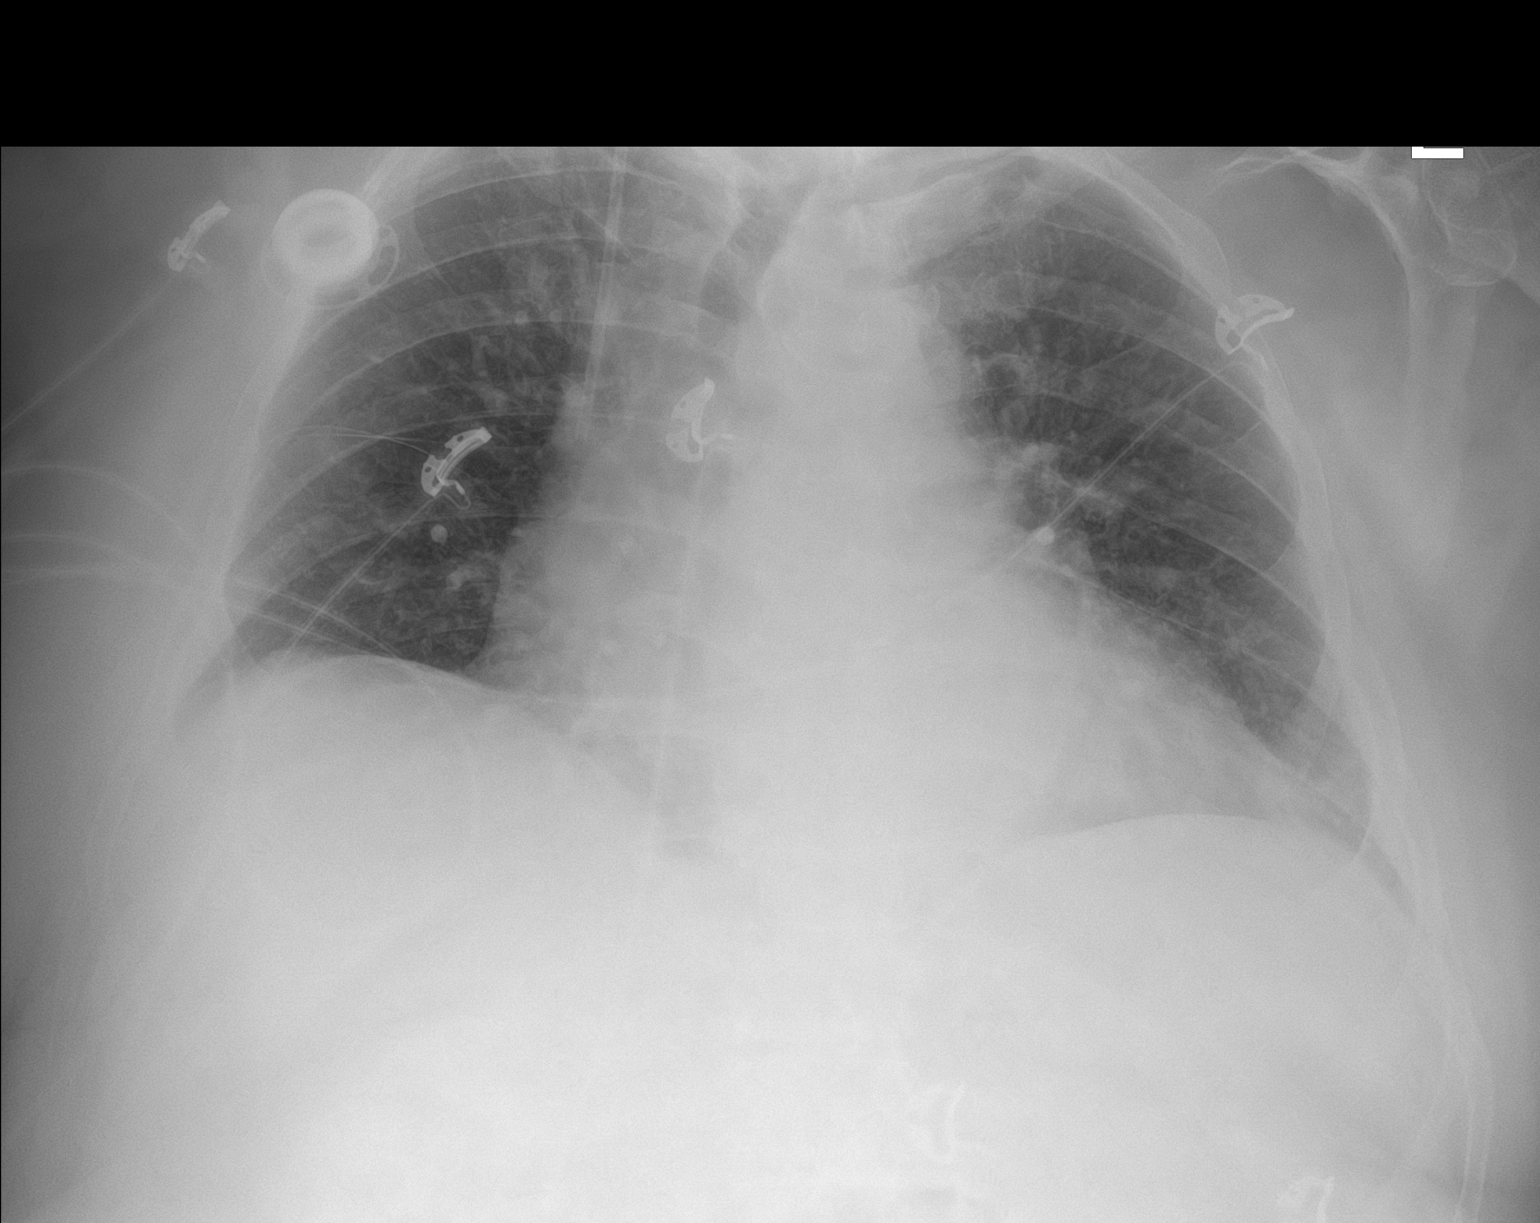

[1 of 1 positions shown; findings below may reference images not displayed]

FINDINGS: The heart is enlarged and the mediastinal contour is within normal
limits. Lung volumes are low. No consolidation, effusion, or
pneumothorax. A right chest port terminates over the superior vena
cava. No acute osseous abnormality.
IMPRESSION: 1. Low lung volumes with no acute process.
2. Cardiomegaly.

## 2021-10-16 IMAGING — MR MR HEAD W/O CM
4 series · 48 of 48 positions shown · non-contrast
Comparison: None.

CLINICAL DATA: Altered mental status

EXAM:
MRI HEAD WITHOUT CONTRAST
TECHNIQUE: Multiplanar, multiecho pulse sequences of the brain and surrounding
structures were obtained without intravenous contrast.

[Series 5: DWI · axial · 3.0mm · 0.96mm/px · z∈[-112,+58]mm · 19 of 120 slices shown (1 of 4)]
[im 1/120]
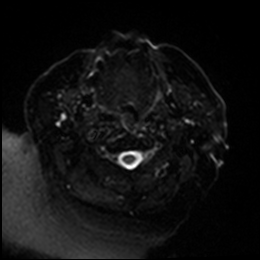
[im 7/120]
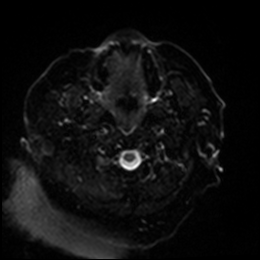
[im 14/120]
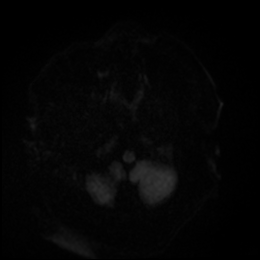
[im 20/120]
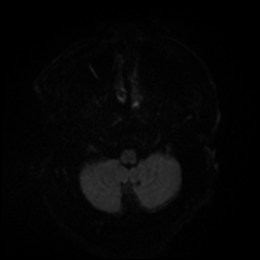
[im 27/120]
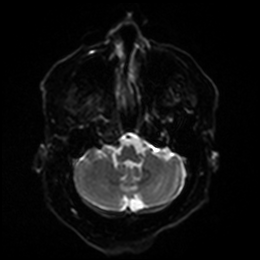
[im 34/120]
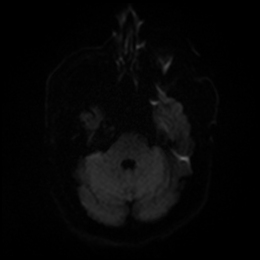
[im 40/120]
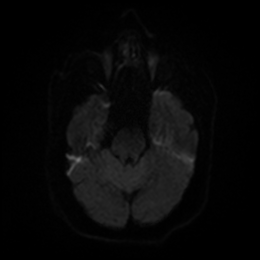
[im 47/120]
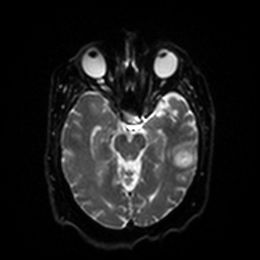
[im 53/120]
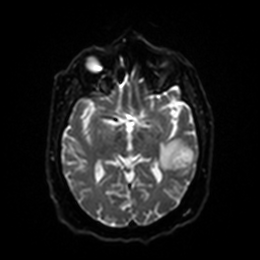
[im 60/120]
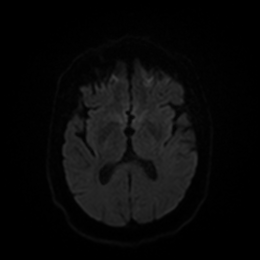
[im 67/120]
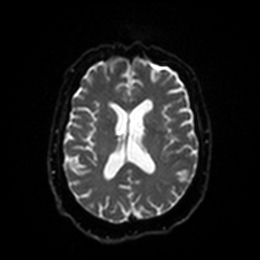
[im 73/120]
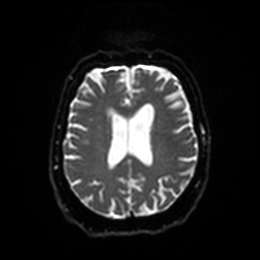
[im 80/120]
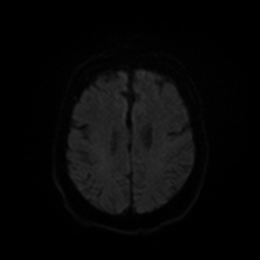
[im 86/120]
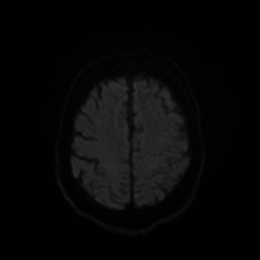
[im 93/120]
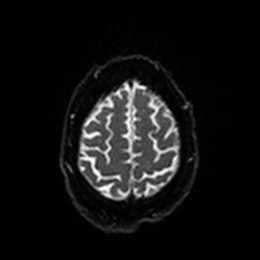
[im 100/120]
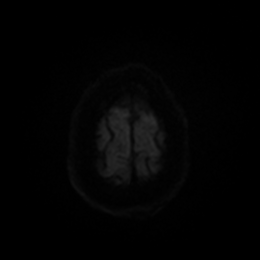
[im 106/120]
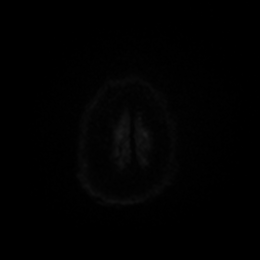
[im 113/120]
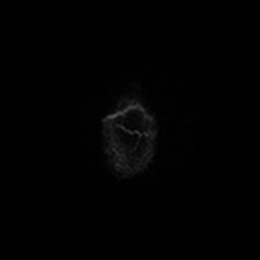
[im 120/120]
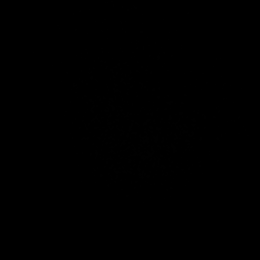

[Series 6: DWI · axial · 3.0mm · 0.96mm/px · z∈[-112,+52]mm · 9 of 58 slices shown (2 of 4)]
[im 1/58]
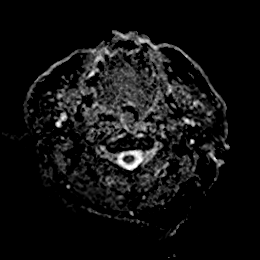
[im 8/58]
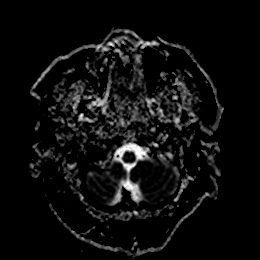
[im 15/58]
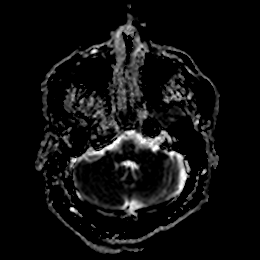
[im 22/58]
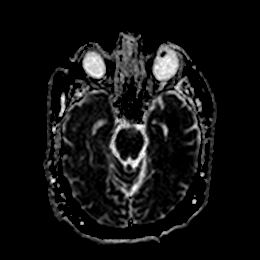
[im 29/58]
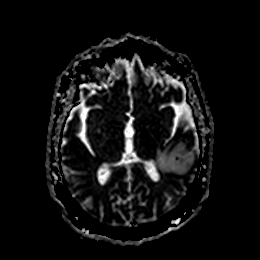
[im 36/58]
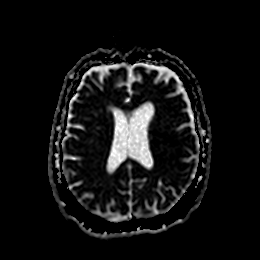
[im 43/58]
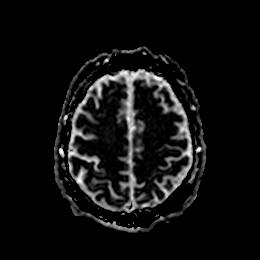
[im 50/58]
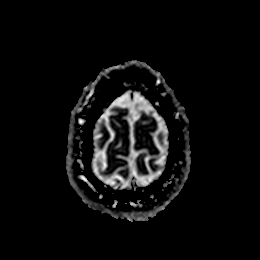
[im 58/58]
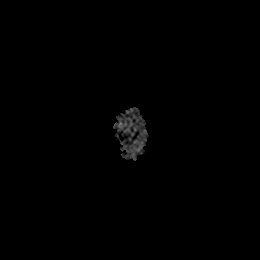

[Series 7: DWI · coronal · 4.0mm · 0.88mm/px · 13 of 82 slices shown (3 of 4)]
[im 1/82]
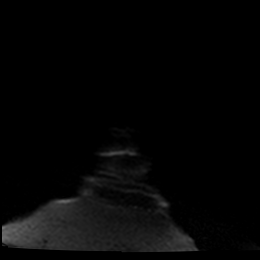
[im 7/82]
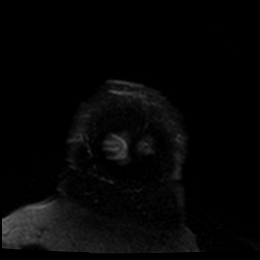
[im 14/82]
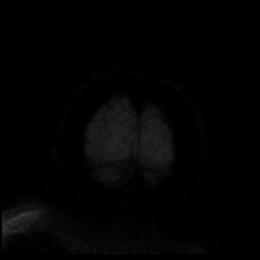
[im 21/82]
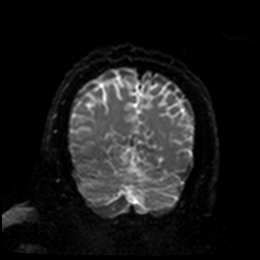
[im 28/82]
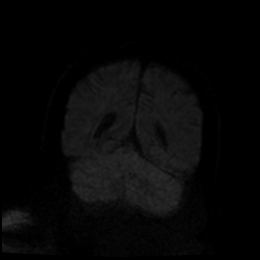
[im 34/82]
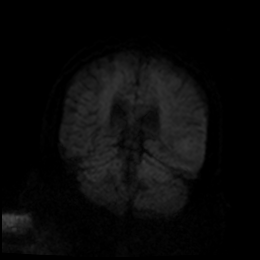
[im 41/82]
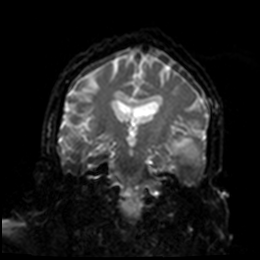
[im 48/82]
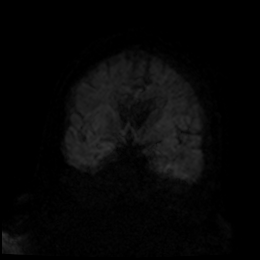
[im 55/82]
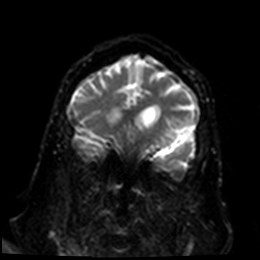
[im 61/82]
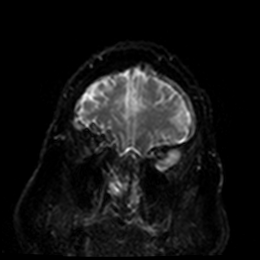
[im 68/82]
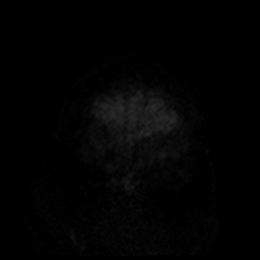
[im 75/82]
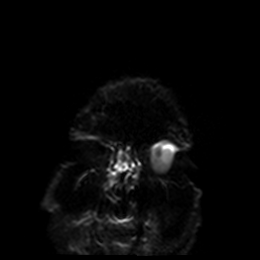
[im 82/82]
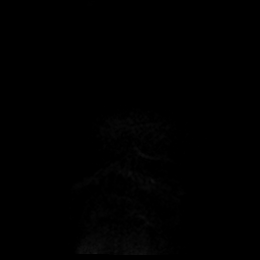

[Series 8: DWI · coronal · 4.0mm · 0.88mm/px · 7 of 41 slices shown (4 of 4)]
[im 1/41]
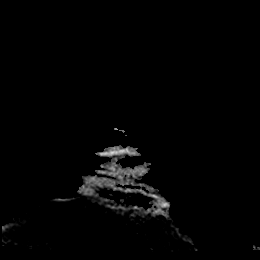
[im 7/41]
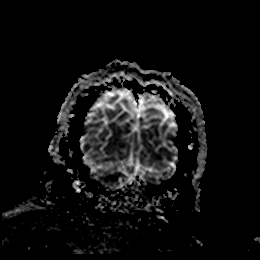
[im 14/41]
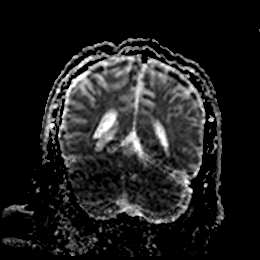
[im 21/41]
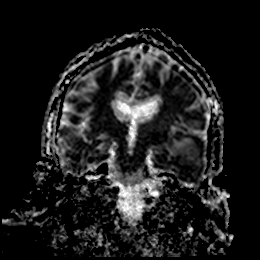
[im 27/41]
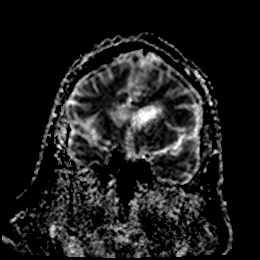
[im 34/41]
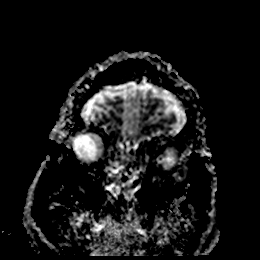
[im 41/41]
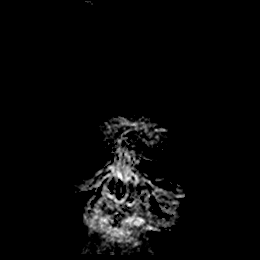

[48 of 48 positions shown; findings below may reference images not displayed]

FINDINGS: Truncated examination due to patient mental status and inability to
tolerate the full examination.

Only diffusion-weighted imaging was obtained. There is a mass lesion
of the left temporal lobe with associated edema. No midline shift.
No other mass is visible on diffusion-weighted imaging.
IMPRESSION: 1. Truncated examination due to patient mental status and inability
to tolerate the full examination.
2. Mass lesion of the left temporal lobe with associated edema. When
possible, a complete MRI with the administration of intravenous
contrast is recommended.

## 2021-10-16 MED ORDER — LEVETIRACETAM IN NACL 500 MG/100ML IV SOLN
500.0000 mg | Freq: Two times a day (BID) | INTRAVENOUS | Status: DC
  Administered 2021-10-16 – 2021-10-24 (×17): 500 mg via INTRAVENOUS
  Filled 2021-10-16 (×19): qty 100

## 2021-10-16 MED ORDER — ACETAMINOPHEN 650 MG RE SUPP: 650.0000 mg | Freq: Four times a day (QID) | RECTAL | Status: DC | PRN

## 2021-10-16 MED ORDER — ACETAMINOPHEN 325 MG PO TABS
650.0000 mg | ORAL_TABLET | Freq: Four times a day (QID) | ORAL | Status: DC | PRN
  Administered 2021-10-16 – 2021-10-24 (×6): 650 mg via ORAL
  Filled 2021-10-16 (×6): qty 2

## 2021-10-16 MED ORDER — LORAZEPAM 2 MG/ML IJ SOLN
0.5000 mg | INTRAMUSCULAR | Status: DC | PRN
  Administered 2021-10-16: 0.5 mg via INTRAVENOUS
  Filled 2021-10-16 (×2): qty 1

## 2021-10-16 MED ORDER — LORAZEPAM 2 MG/ML IJ SOLN
1.0000 mg | Freq: Once | INTRAMUSCULAR | Status: AC
  Administered 2021-10-16: 1 mg via INTRAVENOUS

## 2021-10-16 MED ORDER — INSULIN ASPART 100 UNIT/ML IJ SOLN
0.0000 [IU] | INTRAMUSCULAR | Status: DC
  Administered 2021-10-16: 5 [IU] via SUBCUTANEOUS
  Administered 2021-10-16: 3 [IU] via SUBCUTANEOUS
  Administered 2021-10-16: 5 [IU] via SUBCUTANEOUS
  Administered 2021-10-16: 3 [IU] via SUBCUTANEOUS
  Administered 2021-10-16: 2 [IU] via SUBCUTANEOUS
  Administered 2021-10-17: 7 [IU] via SUBCUTANEOUS
  Administered 2021-10-17: 2 [IU] via SUBCUTANEOUS
  Administered 2021-10-17: 5 [IU] via SUBCUTANEOUS
  Administered 2021-10-17 (×2): 2 [IU] via SUBCUTANEOUS
  Administered 2021-10-17: 7 [IU] via SUBCUTANEOUS
  Administered 2021-10-18: 5 [IU] via SUBCUTANEOUS
  Administered 2021-10-18: 9 [IU] via SUBCUTANEOUS

## 2021-10-16 MED ORDER — OXYCODONE-ACETAMINOPHEN 5-325 MG PO TABS
1.0000 | ORAL_TABLET | Freq: Four times a day (QID) | ORAL | Status: DC | PRN
Start: 2021-10-16 — End: 2021-10-17
  Administered 2021-10-16 – 2021-10-17 (×3): 1 via ORAL
  Filled 2021-10-16 (×4): qty 1

## 2021-10-16 NOTE — ED Notes (Signed)
Sat pt at the bedside to eat lunch.

## 2021-10-16 NOTE — Assessment & Plan Note (Addendum)
No anginal symptoms. -on BB -Continue holding Hyzaar. -PRN demadex

## 2021-10-16 NOTE — Assessment & Plan Note (Addendum)
Stable. -Cardiac meds as above.

## 2021-10-16 NOTE — ED Notes (Signed)
Notified MD that pt has passed his swallow screen and if a diet can be placed.

## 2021-10-16 NOTE — ED Notes (Signed)
MRI called stating pt is pulling at lines. MD approved another order for Ativan. RN heading to MRI

## 2021-10-16 NOTE — Assessment & Plan Note (Addendum)
Previous attending discussed with lab (CT value 38.1).  Per wife, patient has received initial 2 doses of COVID-vaccine and 1 booster shot.  He started having cough and congestion 2 weeks ago.  Chronically uses 2.5 L supplemental oxygen and no change in oxygen requirement from baseline.  CXR without acute finding.  Inflammatory markers within normal. -d/c precautions

## 2021-10-16 NOTE — ED Notes (Signed)
EEG at the bedside  ?

## 2021-10-16 NOTE — ED Notes (Signed)
Pt breakfast tray at the bedside. RN attempted to wake pt but he is still resting.

## 2021-10-16 NOTE — ED Notes (Signed)
This RN attempted to perform modified NIHSS on pt. After numerous attempts to determine pt's LOC and have pt answer orientation status questions, pt would not state his name. Pt only repeatedly asked "When are you going to let me eat" and stated "E-A-T" instead of when asked his name and birth date. When asked the year and month, pt repeatedly stated 0430. Pt was able to squeeze this RN's fingers with his own, but did not keep his extremities in the air while testing motor function. RN paged Dr. Marlowe Sax.

## 2021-10-16 NOTE — ED Notes (Signed)
This RN at bedside to assess pt. Upon arrival to room, pt laying on side. Family stated to this RN that "pt has not been acting right since his MRI", stating that "He isn't talking". Pt noted to 'squeeze eyes tight' when this RN attempted to perform verbal assessment on pt, however, when nursing tech attempted to removed ECG electrode from chest, gave a loud "ouch", and then verbally and appropriately responded to  this RN's questions. RN assessed pt's pain, wife attempted several times to interject assessment by saying to patient "tell her where you hurt" when patient denied pain with this RN. This RN will continue to monitor patient.

## 2021-10-16 NOTE — Progress Notes (Signed)
Patient seen and examined this morning, admitted overnight, H&P reviewed and agree with the assessment and plan.  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  Apparently, he took trazodone, Percocet and his wife gave him nitroglycerin after the symptoms started.  Neurology consulted.  Imaging on admission showed concern for a mass in the left posterior temporal lobe, measuring about 2.5 cm.  Neurology was concerned that he had epileptic aphasia versus seizures, he was placed on Keppra and admitted to the hospital.  Principal problem Acute metabolic encephalopathy, underlying brain mass, epileptic aphasia versus seizures -patient admitted to the hospital with acute encephalopathy.  Complicating factor is the fact that he has been taking trazodone and Percocet shortly before coming here.  Continues to be confused this morning, will likely need an MRI with and without contrast to better determine the mass.  Defer antiepileptics as well as potentially Decadron to neurology team  Active problems Sinus bradycardia-apparently in the ED his heart rate was as low as 30s.  His home metoprolol is being held.  Monitor on telemetry  COVID-19 positive-incidental, high CT value at 38.  Has been having some URI type symptoms for the past couple of weeks, but hard to say whether a CT value represents an upcoming infection versus an improving one.  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 Cruzita Lederer, MD, PhD [facility]  Between 7 am - 7 pm you can contact me via Amion (for emergencies) or [place] (non urgent matters).  I am not available 7 pm - 7 am, please contact night coverage MD/APP via Amion

## 2021-10-16 NOTE — ED Notes (Signed)
Family at bedside inquiring next steps in pt care. RN explained to pt MRI is the next step and PRN Ativan is ordered.   Family inquiring about long wait for MRI. RN called MRI and was told at the moment know timeframe can be given and the wait is awhile.

## 2021-10-16 NOTE — ED Notes (Signed)
Pt at bedside eating dinner. Family helping with meal tray.

## 2021-10-16 NOTE — ED Notes (Signed)
Pt is able to carry a conversation within reasons. Pt states he isn't sure what the month is but he is able to verify name and dob. Pt can state he is at the hospital and hungry.

## 2021-10-16 NOTE — ED Triage Notes (Signed)
Pt arrives to ED BIB [facility] as a Code Stroke. Per EMS pt LKW was 1930 and around [address] pt's wife noticed that pt was "stumbling on words" Per EMS pt's wife gave him 2 trazadone pills and a nitro.

## 2021-10-16 NOTE — ED Notes (Addendum)
Pt's wife approached nurses' station stating to RN "I know you guys have emergencies, but who is going to look in on him", also inquiring about status of patient's bed. This RN explained to patient's wife that patient is being looked after, that his bed is assigned and this RN is following up on room status. Wife states "he cannot tell anyone when he needs to use the restroom", this RN followed up with how pt has been toileting throughout the day, wife endorses holding urinal to help patient, stating "he let me know when he had to use it". Wife also stated that patient was able to communicate toileting needs with offgoing RN. This RN reassured wife that we will continue to care for patient.  Both family members have left bedside at this time.

## 2021-10-16 NOTE — ED Notes (Signed)
Pt noted to removed gown, nasal cannula from face and monitoring equipment. This RN reapplied each item, educated pt on importance of leaving items in place. Pt able to move self up in bed with this RN's assistance. Pt moves all limbs freely.

## 2021-10-16 NOTE — ED Notes (Signed)
Placed pt on BSC. Changed pt linens and pericare

## 2021-10-16 NOTE — Assessment & Plan Note (Addendum)
-  resume elquis

## 2021-10-16 NOTE — Progress Notes (Incomplete)
Neurology Progress Note  Brief HPI: Glen Kesinger Sr. is a 66 y.o. 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 Patient had taken 2 tablets of trazodone 200 mg and a percocet prior to EMS arrival. CT head concerning for subacute infarct vs mass lesion in L posteror temporal lobe. Patient was also found to be COVID-19 positive. Patient was loaded with IV Keppra 3 g and started on Keppra 500 mg bid.   Subjective: ***  Exam: Vitals:   10/16/21 0945 10/16/21 1000  BP: 114/72 134/71  Pulse: 73 72  Resp: 20 18  Temp:    SpO2: 93% 90%   Gen: In bed, NAD Resp: non-labored breathing, no acute distress Abd: soft, nt  Neuro: Mental Status: *** Cranial Nerves:*** Motor: *** Sensory:*** DTR: *** Gait: Deferred  Pertinent Labs: ***  Imaging Reviewed:   Assessment: ***  Impression: ***  Recommendations: 1)***  Anibal Henderson, AGACNP-BC [facility] [phone]

## 2021-10-16 NOTE — ED Notes (Signed)
Patient transported to MRI 

## 2021-10-16 NOTE — ED Notes (Signed)
Pt noted to be out of bed, at sink with linens from cabinet and water running. Pt re-oriented and placed back in bed and monitoring equipment re-applied. Transported to 4N10 by this RN.

## 2021-10-16 NOTE — ED Notes (Addendum)
MRI called and stated pt was unable to complete scan. RN notified MD

## 2021-10-16 NOTE — Procedures (Signed)
History: 66 yo M with AMS with left temporal mass   Sedation: none  Technique: This EEG was acquired with electrodes placed according to the International 10-20 electrode system (including Fp1, Fp2, F3, F4, C3, C4, P3, P4, O1, O2, T3, T4, T5, T6, A1, A2, Fz, Cz, Pz). The following electrodes were missing or displaced: none.   Background: There is a posterior dominant rhythm of 9 Hz. There is focal irregular delta activity maximal in the left temporal region. In addition, there are occasioanl left temporal sharp waves, T7, P7 > F7   Photic stimulation: Physiologic driving is not performed.   EEG Abnormalities: 1) Left temporal sharp waves 2) Left temporal slow activity  Clinical Interpretation: This EEG is consistent with an area of potential epileptogenicity in the left temporal region.   There was no seizure recorded on this study. Please note that lack of epileptiform activity on EEG does not preclude the possibility of epilepsy.   Roland Rack, MD [facility] [phone]  If 7pm- 7am, please page neurology on call as listed in [place].

## 2021-10-16 NOTE — ED Notes (Signed)
Provided pt with some tissues

## 2021-10-16 NOTE — Assessment & Plan Note (Addendum)
Likely due to beta-blocker.  He was on metoprolol 100 mg twice daily at home PO BB -Continue telemetry monitoring

## 2021-10-16 NOTE — ED Notes (Signed)
Marlowe Sax, MD at bedside.

## 2021-10-16 NOTE — ED Notes (Signed)
Pt eating breakfast 

## 2021-10-16 NOTE — ED Notes (Signed)
Neurologist made aware of inability to obtain full modified NIHSS

## 2021-10-16 NOTE — ED Notes (Signed)
Marlowe Sax, MD and Leonette Monarch, MD made aware of bradycardia

## 2021-10-16 NOTE — ED Notes (Signed)
Pt breakfast tray ordered.

## 2021-10-16 NOTE — Plan of Care (Signed)
Patient pending MRI brain w+w/o. On AED for now. Per RN, no acute changes. Neurology will follow after imaging. Will need neurosurg and oncology as well once the brain MRI is completed.  -- Amie [place], MD Neurologist [facility] Pager: [phone]

## 2021-10-16 NOTE — ED Notes (Addendum)
Pt spouse stated husband is in pain and not coherent. Explained to spouse RN spoke with pt about his pain and pt stated no pain. Pt was able to tell me his wife name, pt name, dob, and location is hospital. He was not able to tell me current month.   Pt stated he has a headache offered pt Tylenol.   Pt spouse stated he normally takes 4 percocet daily. Notified MD.

## 2021-10-16 NOTE — ED Notes (Signed)
Family stated pt made a comment stating he wants to leave. Family was informed by another RN that pt has the right to decline care. Pt spouse states she doesn't feel he is at the capacity to make that decision.   Rn Larene [place]) explained to pt that if the pt make a request to leave the MD will be made aware. RN and MD will assess pt mental status on making decisions prior and can alert the spouse if she isn't present at the bedside.

## 2021-10-16 NOTE — Progress Notes (Signed)
Unable to complete pt's ordered MRI at this time due to pt condition. Pt medicated per nurse prior to coming down for scan. Attempted scan, pt began moving whole body removing head coil required for imaging and attempting to get out of the scanner. Contacted RN for further meds. Further meds given by RN. Reattempted, pt again removing head coil required for imaging and attempting to get out of the scanner. RN made aware and provider made aware per RN. Pt sent back to ED.

## 2021-10-16 NOTE — Progress Notes (Signed)
EEG complete - results pending 

## 2021-10-16 NOTE — ED Notes (Signed)
Pt concerned that pt has waited a long time for MRI. RN verified wait time and MRI stated at the moment there isn't a timeframe available.   Pt concerned he is resting and clam and can sit for the MRI. RN explained that pt has PRN Ativan for the scan if needed.

## 2021-10-16 NOTE — ED Notes (Signed)
Per MRI Tech Pt not staying still for exam. [place], MD has been notified. No new orders at this time.

## 2021-10-16 NOTE — Assessment & Plan Note (Deleted)
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  He took 2 tablets of trazodone 200 mg at home prior to EMS arrival.  He has been somnolent since arrival to the ED. CT head showing 3 to 4 cm region of subacute infarction versus possibly a mass lesion in the left posterior temporal lobe.  There may be some internal petechial blood products but there is no frank hematoma.  CTA head negative for LVO.  CT cerebral perfusion study showing hyperemic mass in the left posterior temporal lobe, measuring about 2.5 cm in size. Neurology felt that patient probably had epileptic aphasia versus seizure.  -Patient was given loading dose IV Keppra 3 g and started on Keppra 500 mg twice daily by neurology.  Recommendation is to obtain brain MRI with and without contrast and routine EEG for further evaluation.

## 2021-10-16 NOTE — ED Notes (Signed)
Upon entering the room spouse stated husband is in pain and moaning. Pt was laying in bed with eyes closed. RN asked pt if he is in pain. Pt moan and stated yes. RN will look at pt [facility]

## 2021-10-16 NOTE — ED Notes (Signed)
Pt is at the bedside stating he is ready to leave and see baby (dog). Pt started scooting to edge of bed saying he want to have his feet sit on dock. Wife and daughter at bedside trying to encourage pt to lay down.  RN was able to redirect pt to lay down in bed until MRI. RN notified MD that pt is stating things out the ordinary from earlier.

## 2021-10-16 NOTE — H&P (Signed)
History and Physical    Jeff Wood YQM:[id number] DOB: [birth year] DOA: 10/15/2021  PCP: Pcp, No  Patient coming from: Home  HPI: Jeff Defina Sr. is a 66 y.o. male with a past medical history of CAD on Plavix, DVT on Eliquis, hypertension, hyperlipidemia, type II diabetes presented to the ED as code stroke for evaluation of acute onset aphasia which started about an hour prior to arrival.  Patient had taken trazodone, Percocet, and nitro at home shortly after his symptoms started.  Patient arrived somnolent.  Labs showing glucose 230 and no other significant abnormalities.  Blood ethanol level undetectable.  UDS negative.  UA without signs of infection.  SARS-CoV-2 PCR test positive.  CT head showing 3 to 4 cm region of subacute infarction versus possibly a mass lesion in the left posterior temporal lobe.  There may be some internal petechial blood products but there is no frank hematoma.  CTA head negative for LVO.  CT cerebral perfusion study showing hyperemic mass in the left posterior temporal lobe, measuring about 2.5 cm in size. Neurology felt that patient probably had epileptic aphasia versus seizure.  He was given loading dose IV Keppra 3 g and started on Keppra 500 mg twice daily.  Recommendation was to obtain brain MRI with and without contrast and routine EEG for further evaluation.  Patient is very somnolent and not able to give any history.  Jeff Wood at bedside states this evening around dinnertime (8 PM) patient appeared confused and started having difficulty with his speech/word finding difficulty.  He then took 2 trazodone tablets for sleep and Jeff Wood gave him a nitroglycerin because she thought his problem was related to his heart.  Jeff Wood is not sure if patient took any other medications at that time.  She is concerned that he has been very sleepy since after he took trazodone.  States they are originally from [place] and moved to [place] to be closer to their daughter.  Per  Jeff Wood, patient has a history of diabetes, chronic bronchitis, chronic bilateral lower extremity edema due to venous insufficiency, CAD with stent placed 2-1/2 years ago on Plavix, and extensive right lower extremity DVT 2 years ago on Eliquis.  Jeff Wood states patient has received initial 2 COVID vaccines and 1 booster shot.  About 2 weeks ago he started endorsing cough and congestion.  She started giving him doxycycline 100 mg twice daily about 4 days ago.  Jeff Wood states patient has been endorsing headaches for the past few weeks and is "always nauseous" and uses her nausea medications.  Chronically uses 2.5 L supplemental oxygen.  Review of Systems:  All systems reviewed and apart from history of presenting illness, are negative.  Past medical history: See HPI.   has no history on file for tobacco use, alcohol use, and drug use.  Allergies  Allergen Reactions   Iodine Anaphylaxis   Shellfish Allergy Anaphylaxis    Family history: Unable to obtain at this time given patient's altered mental status/somnolence.  Prior to Admission medications   Medication Sig Start Date End Date Taking? Authorizing Provider  acetaminophen (TYLENOL) 500 MG tablet Take 500 mg by mouth every 6 (six) hours as needed for headache.   Yes [provider]  albuterol (VENTOLIN HFA) 108 (90 Base) MCG/ACT inhaler Inhale 1-2 puffs into the lungs every 6 (six) hours as needed for wheezing or shortness of breath.   Yes [provider]  apixaban (ELIQUIS) 5 MG TABS tablet Take 5 mg by mouth 2 (  two) times daily.   Yes [provider]  atorvastatin (LIPITOR) 40 MG tablet Take 40 mg by mouth daily.   Yes [provider]  budesonide (PULMICORT) 0.5 MG/2ML nebulizer solution Take 0.5 mg by nebulization 2 (two) times daily as needed (wheezing).   Yes [provider]  Cholecalciferol (VITAMIN D3 GUMMIES PO) Take 2 tablets by mouth every evening.   Yes [provider]  clopidogrel  (PLAVIX) 75 MG tablet Take 75 mg by mouth daily.   Yes [provider]  famotidine (PEPCID) 20 MG tablet Take 20 mg by mouth daily.   Yes [provider]  fluticasone (FLONASE) 50 MCG/ACT nasal spray Place 2 sprays into both nostrils in the morning and at bedtime.   Yes [provider]  FLUTICASONE FUROATE-VILANTEROL IN Inhale 1 puff into the lungs daily.   Yes [provider]  Fluticasone-Umeclidin-Vilant (TRELEGY ELLIPTA IN) Inhale 1 puff into the lungs daily.   Yes [provider]  gabapentin (NEURONTIN) 300 MG capsule Take 300 mg by mouth 3 (three) times daily.   Yes [provider]  gemfibrozil (LOPID) 600 MG tablet Take 600 mg by mouth 2 (two) times daily before a meal.   Yes [provider]  glipiZIDE (GLUCOTROL XL) 5 MG 24 hr tablet Take 5 mg by mouth in the morning and at bedtime.   Yes [provider]  ipratropium-albuterol (DUONEB) 0.5-2.5 (3) MG/3ML SOLN Take 3 mLs by nebulization every 6 (six) hours as needed (shortness of breath).   Yes [provider]  isosorbide mononitrate (IMDUR) 30 MG 24 hr tablet Take 30 mg by mouth daily.   Yes [provider]  losartan-hydrochlorothiazide (HYZAAR) 100-12.5 MG tablet Take 1 tablet by mouth daily.   Yes [provider]  metoprolol tartrate (LOPRESSOR) 100 MG tablet Take 100 mg by mouth 2 (two) times daily.   Yes [provider]  montelukast (SINGULAIR) 10 MG tablet Take 10 mg by mouth every evening.   Yes [provider]  Multiple Vitamins-Minerals (MENS MULTIVITAMIN PO) Take 1 tablet by mouth daily.   Yes [provider]  nitroGLYCERIN (NITROSTAT) 0.4 MG SL tablet Place 0.4 mg under the tongue every 5 (five) minutes as needed for chest pain.   Yes [provider]  oxyCODONE-acetaminophen (PERCOCET) 10-325 MG tablet Take 1 tablet by mouth 4 (four) times daily as needed for pain.   Yes [provider]   pantoprazole (PROTONIX) 40 MG tablet Take 40 mg by mouth daily.   Yes [provider]  tiZANidine (ZANAFLEX) 4 MG tablet Take 4 mg by mouth every 8 (eight) hours as needed for muscle spasms.   Yes [provider]  torsemide (DEMADEX) 20 MG tablet Take 20 mg by mouth daily.   Yes [provider]  traZODone (DESYREL) 100 MG tablet Take 200 mg by mouth at bedtime as needed for sleep.   Yes [provider]    Physical Exam: Vitals:   10/16/21 0225 10/16/21 0226 10/16/21 0230 10/16/21 0254  BP:   123/62   Pulse: 64 65 64   Resp: 17 20 19    Temp:      TempSrc:      SpO2: 99% 99% 98%   Weight:    123.6 kg  Height:    5\' 10"  (1.778 m)    Physical Exam Constitutional:      General: He is not in acute distress.    Appearance: He is not diaphoretic.  HENT:  Head: Normocephalic and atraumatic.  Cardiovascular:     Rate and Rhythm: Normal rate and regular rhythm.     Pulses: Normal pulses.  Pulmonary:     Effort: Pulmonary effort is normal. No respiratory distress.     Breath sounds: Normal breath sounds. No wheezing or rales.  Abdominal:     General: Bowel sounds are normal. There is no distension.     Palpations: Abdomen is soft.     Tenderness: There is no abdominal tenderness.  Musculoskeletal:        General: No swelling or tenderness.     Cervical back: Normal range of motion and neck supple.  Skin:    General: Skin is warm and dry.  Neurological:     Comments: Very somnolent but arousable Confused and disoriented Moving all extremities on command, no focal weakness     Labs on Admission: I have personally reviewed following labs and imaging studies  CBC: Recent Labs  Lab 10/15/21 2138 10/15/21 2148  WBC 4.7  --   NEUTROABS 2.6  --   HGB 14.0 13.9  HCT 43.4 41.0  MCV 99.5  --   PLT 166  --    Basic Metabolic Panel: Recent Labs  Lab 10/15/21 2138 10/15/21 2148  NA 137 138  K 4.0 4.0  CL 99 99  CO2 28  --   GLUCOSE  230* 226*  BUN 15 17  CREATININE 0.91 0.90  CALCIUM 9.1  --    GFR: Estimated Creatinine Clearance: 105 mL/min (by C-G formula based on SCr of 0.9 mg/dL). Liver Function Tests: Recent Labs  Lab 10/15/21 2138  AST 44*  ALT 42  ALKPHOS 48  BILITOT 0.4  PROT 7.2  ALBUMIN 3.6   No results for input(s): LIPASE, AMYLASE in the last 168 hours. No results for input(s): AMMONIA in the last 168 hours. Coagulation Profile: Recent Labs  Lab 10/15/21 2138  INR 1.2   Cardiac Enzymes: No results for input(s): CKTOTAL, CKMB, CKMBINDEX, TROPONINI in the last 168 hours. BNP (last 3 results) No results for input(s): PROBNP in the last 8760 hours. HbA1C: No results for input(s): HGBA1C in the last 72 hours. CBG: No results for input(s): GLUCAP in the last 168 hours. Lipid Profile: No results for input(s): CHOL, HDL, LDLCALC, TRIG, CHOLHDL, LDLDIRECT in the last 72 hours. Thyroid Function Tests: No results for input(s): TSH, T4TOTAL, FREET4, T3FREE, THYROIDAB in the last 72 hours. Anemia Panel: No results for input(s): VITAMINB12, FOLATE, FERRITIN, TIBC, IRON, RETICCTPCT in the last 72 hours. Urine analysis:    Component Value Date/Time   COLORURINE STRAW (A) 10/15/2021 2311   APPEARANCEUR CLEAR 10/15/2021 2311   LABSPEC 1.010 10/15/2021 2311   PHURINE 6.5 10/15/2021 2311   GLUCOSEU >=500 (A) 10/15/2021 2311   HGBUR TRACE (A) 10/15/2021 2311   BILIRUBINUR NEGATIVE 10/15/2021 2311   KETONESUR NEGATIVE 10/15/2021 2311   PROTEINUR NEGATIVE 10/15/2021 2311   NITRITE NEGATIVE 10/15/2021 2311   LEUKOCYTESUR NEGATIVE 10/15/2021 2311    Radiological Exams on Admission: I have personally reviewed images MR BRAIN WO CONTRAST  Result Date: 10/16/2021 CLINICAL DATA:  Altered mental status EXAM: MRI HEAD WITHOUT CONTRAST TECHNIQUE: Multiplanar, multiecho pulse sequences of the brain and surrounding structures were obtained without intravenous contrast. COMPARISON:  None. FINDINGS: Truncated  examination due to patient mental status and inability to tolerate the full examination. Only diffusion-weighted imaging was obtained. There is a mass lesion of the left temporal lobe with associated edema. No midline shift. No  other mass is visible on diffusion-weighted imaging. IMPRESSION: 1. Truncated examination due to patient mental status and inability to tolerate the full examination. 2. Mass lesion of the left temporal lobe with associated edema. When possible, a complete MRI with the administration of intravenous contrast is recommended. Electronically Signed   By: Ulyses Jarred M.D.   On: 10/16/2021 03:56   CT CEREBRAL PERFUSION W CONTRAST  Result Date: 10/15/2021 CLINICAL DATA:  Neuro deficit, acute, stroke suspected. EXAM: CT ANGIOGRAPHY HEAD AND NECK CT PERFUSION BRAIN TECHNIQUE: Multidetector CT imaging of the head and neck was performed using the standard protocol during bolus administration of intravenous contrast. Multiplanar CT image reconstructions and MIPs were obtained to evaluate the vascular anatomy. Carotid stenosis measurements (when applicable) are obtained utilizing NASCET criteria, using the distal internal carotid diameter as the denominator. Multiphase CT imaging of the brain was performed following IV bolus contrast injection. Subsequent parametric perfusion maps were calculated using RAPID software. RADIATION DOSE REDUCTION: This exam was performed according to the departmental dose-optimization program which includes automated exposure control, adjustment of the mA and/or kV according to patient size and/or use of iterative reconstruction technique. CONTRAST:  Not annotated COMPARISON:  Head CT earlier same day FINDINGS: CTA NECK FINDINGS Aortic arch: Aortic atherosclerosis, mild. Right carotid system: Common carotid artery widely patent to the bifurcation. Carotid bifurcation is normal. Left carotid system: Common carotid artery widely patent to the bifurcation. Carotid  bifurcation is normal. Vertebral arteries: Vertebral artery origins are poorly seen. Beyond the origins, the vessels are patent through the cervical region to the foramen magnum. Skeleton: Ordinary cervical spondylosis. Other neck: No mass or lymphadenopathy. Upper chest: Small amount of dependent pleural fluid and dependent pulmonary atelectasis. Review of the MIP images confirms the above findings CTA HEAD FINDINGS Anterior circulation: Both internal carotid arteries widely patent through the skull base and siphon regions. The anterior and middle cerebral vessels are patent. No large vessel occlusion. Posterior circulation: Both vertebral arteries are widely patent to the basilar. No basilar stenosis. No large vessel occlusion. Venous sinuses: Patent and normal. Anatomic variants: None significant. Review of the MIP images confirms the above findings CT Brain Perfusion Findings: ASPECTS: 9 CBF (<30%) Volume: 41mL Perfusion (Tmax>6.0s) volume: 27mL Mismatch Volume: 24mL Infarction Location:None There is actual hyperemia in the left posterior temporal lobe, with a peripherally enhancing mass lesion visible, measuring about 2.5 cm in size. IMPRESSION: No large vessel occlusion. 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 Electronically Signed   By: Nelson Chimes M.D.   On: 10/15/2021 22:35   CT HEAD CODE STROKE WO CONTRAST  Result Date: 10/15/2021 CLINICAL DATA:  Code stroke. Neuro deficit, acute, stroke suspected. EXAM: CT HEAD WITHOUT CONTRAST TECHNIQUE: Contiguous axial images were obtained from the base of the skull through the vertex without intravenous contrast. RADIATION DOSE REDUCTION: This exam was performed according to the departmental dose-optimization program which includes automated exposure control, adjustment of the mA and/or kV according to patient size and/or use of iterative reconstruction technique. COMPARISON:  None. FINDINGS: Brain: The brainstem and  cerebellum are normal. Right cerebral hemisphere is normal. There is a 3-4 cm region of abnormal low-density and mild swelling in the left mid to posterior temporal lobe. This could represent a early subacute infarction mass lesion is not excluded. There may be some internal petechial blood products but there is no frank hematoma. Elsewhere the brain appears normal. No hydrocephalus or extra-axial collection. Vascular: No abnormal vascular  finding. Skull: Negative Sinuses/Orbits: Clear/normal Other: None ASPECTS ([place] Stroke Program Early CT Score) - Ganglionic level infarction (caudate, lentiform nuclei, internal capsule, insula, M1-M3 cortex): 6 - Supraganglionic infarction (M4-M6 cortex): 3 Total score (0-10 with 10 being normal): 9 IMPRESSION: 1. Question 3-4 cm region of subacute infarction versus possibly a mass lesion in the left posterior temporal lobe. There may be some internal petechial blood products but there is no frank hematoma. 2. ASPECTS is 9 3. Case discussed by telephone with Dr. Lorrin Goodell at 2200 hours. Electronically Signed   By: Nelson Chimes M.D.   On: 10/15/2021 22:06   CT ANGIO HEAD NECK W WO CM (CODE STROKE)  Result Date: 10/15/2021 CLINICAL DATA:  Neuro deficit, acute, stroke suspected. EXAM: CT ANGIOGRAPHY HEAD AND NECK CT PERFUSION BRAIN TECHNIQUE: Multidetector CT imaging of the head and neck was performed using the standard protocol during bolus administration of intravenous contrast. Multiplanar CT image reconstructions and MIPs were obtained to evaluate the vascular anatomy. Carotid stenosis measurements (when applicable) are obtained utilizing NASCET criteria, using the distal internal carotid diameter as the denominator. Multiphase CT imaging of the brain was performed following IV bolus contrast injection. Subsequent parametric perfusion maps were calculated using RAPID software. RADIATION DOSE REDUCTION: This exam was performed according to the departmental  dose-optimization program which includes automated exposure control, adjustment of the mA and/or kV according to patient size and/or use of iterative reconstruction technique. CONTRAST:  Not annotated COMPARISON:  Head CT earlier same day FINDINGS: CTA NECK FINDINGS Aortic arch: Aortic atherosclerosis, mild. Right carotid system: Common carotid artery widely patent to the bifurcation. Carotid bifurcation is normal. Left carotid system: Common carotid artery widely patent to the bifurcation. Carotid bifurcation is normal. Vertebral arteries: Vertebral artery origins are poorly seen. Beyond the origins, the vessels are patent through the cervical region to the foramen magnum. Skeleton: Ordinary cervical spondylosis. Other neck: No mass or lymphadenopathy. Upper chest: Small amount of dependent pleural fluid and dependent pulmonary atelectasis. Review of the MIP images confirms the above findings CTA HEAD FINDINGS Anterior circulation: Both internal carotid arteries widely patent through the skull base and siphon regions. The anterior and middle cerebral vessels are patent. No large vessel occlusion. Posterior circulation: Both vertebral arteries are widely patent to the basilar. No basilar stenosis. No large vessel occlusion. Venous sinuses: Patent and normal. Anatomic variants: None significant. Review of the MIP images confirms the above findings CT Brain Perfusion Findings: ASPECTS: 9 CBF (<30%) Volume: 18mL Perfusion (Tmax>6.0s) volume: 16mL Mismatch Volume: 67mL Infarction Location:None There is actual hyperemia in the left posterior temporal lobe, with a peripherally enhancing mass lesion visible, measuring about 2.5 cm in size. IMPRESSION: No large vessel occlusion. 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 Electronically Signed   By: Nelson Chimes M.D.   On: 10/15/2021 22:35    EKG: I have personally reviewed EKG: Sinus rhythm with first-degree AV block.  Baseline wander  in V1.  No prior tracing for comparison.  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  He took 2 tablets of trazodone 200 mg at home prior to EMS arrival.  He has been somnolent since arrival to the ED. CT head showing 3 to 4 cm region of subacute infarction versus possibly a mass lesion in the left posterior temporal lobe.  There may be some internal petechial  blood products but there is no frank hematoma.  CTA head negative for LVO.  CT cerebral perfusion study showing hyperemic mass in the left posterior temporal lobe, measuring about 2.5 cm in size. Neurology felt that patient probably had epileptic aphasia versus seizure.  -Patient was given loading dose IV Keppra 3 g and started on Keppra 500 mg twice daily by neurology.  Recommendation is to obtain brain MRI with and without contrast and routine EEG for further evaluation.  Sinus bradycardia- (present on admission) Patient became bradycardic in the ED with heart rate as low as 30s but blood pressure remained stable.  He takes metoprolol at home.  EKG repeated and showing sinus rhythm (heart rate 64), borderline PR prolongation, and borderline T wave abnormality in inferior and lateral leads.  Heart rate currently in the 60s. -Cardiac monitoring.  Hold metoprolol at this time.  Check TSH and high-sensitivity troponin x2.  Lab test positive for detection of COVID-19 virus- (present on admission) SARS-CoV-2 PCR test positive, discussed with lab (CT value 38.1).  Per Jeff Wood, patient has received initial 2 doses of COVID-vaccine and 1 booster shot.  He started having cough and congestion 2 weeks ago.  Chronically uses 2.5 L supplemental oxygen and no change in oxygen requirement from baseline. -Continue isolation precautions at this time.  Chest x-ray ordered.  Check inflammatory markers.  Diabetes  mellitus type 2, noninsulin dependent ([place]) Blood glucose slightly elevated. -Check A1c.  Hold glipizide.  Order sliding scale insulin sensitive every 4 hours.  Essential hypertension Stable. -Resume home oral antihypertensives once patient is more awake  History of DVT (deep vein thrombosis) -Hold Plavix at this time, brain MRI pending  CAD S/P percutaneous coronary angioplasty -Hold Plavix at this time, brain MRI pending   DVT prophylaxis:  Hold Eliquis at this time, brain MRI pending. Code Status: Full Code (discussed with the patient's Jeff Wood) Family Communication: Jeff Wood at bedside. Consults called: Neurology Level of care: Telemetry bed Admission status: It is my clinical opinion that referral for OBSERVATION is reasonable and necessary in this patient based on the above information provided. The aforementioned taken together are felt to place the patient at high risk for further clinical deterioration. However, it is anticipated that the patient may be medically stable for discharge from the hospital within 24 to 48 hours.   Shela Leff, MD [facility] 10/16/2021, 3:58 AM

## 2021-10-16 NOTE — Assessment & Plan Note (Addendum)
Uncontrolled NIDDM-2 with hyperglycemia and other complications: Z1Y 8.1%.  Seems he is only on glipizide at home.  Hyperglycemia likely due to steroid. -adjust medications as able

## 2021-10-17 DIAGNOSIS — E669 Obesity, unspecified: Secondary | ICD-10-CM

## 2021-10-17 DIAGNOSIS — G9341 Metabolic encephalopathy: Secondary | ICD-10-CM

## 2021-10-17 DIAGNOSIS — E785 Hyperlipidemia, unspecified: Secondary | ICD-10-CM

## 2021-10-17 DIAGNOSIS — R4701 Aphasia: Secondary | ICD-10-CM

## 2021-10-17 HISTORY — DX: Morbid (severe) obesity due to excess calories: E66.01

## 2021-10-17 LAB — CBC
HCT: 39.4 % (ref 39.0–52.0)
Hemoglobin: 13.4 g/dL (ref 13.0–17.0)
MCH: 32.5 pg (ref 26.0–34.0)
MCHC: 34 g/dL (ref 30.0–36.0)
MCV: 95.6 fL (ref 80.0–100.0)
Platelets: 154 10*3/uL (ref 150–400)
RBC: 4.12 MIL/uL — ABNORMAL LOW (ref 4.22–5.81)
RDW: 13.4 % (ref 11.5–15.5)
WBC: 5.1 10*3/uL (ref 4.0–10.5)
nRBC: 0 % (ref 0.0–0.2)

## 2021-10-17 LAB — COMPREHENSIVE METABOLIC PANEL
ALT: 58 U/L — ABNORMAL HIGH (ref 0–44)
AST: 112 U/L — ABNORMAL HIGH (ref 15–41)
Albumin: 3.5 g/dL (ref 3.5–5.0)
Alkaline Phosphatase: 48 U/L (ref 38–126)
Anion gap: 12 (ref 5–15)
BUN: 12 mg/dL (ref 8–23)
CO2: 24 mmol/L (ref 22–32)
Calcium: 9 mg/dL (ref 8.9–10.3)
Chloride: 100 mmol/L (ref 98–111)
Creatinine, Ser: 0.85 mg/dL (ref 0.61–1.24)
GFR, Estimated: >60 mL/min (ref >60)
Glucose, Bld: 186 mg/dL — ABNORMAL HIGH (ref 70–99)
Potassium: 3.7 mmol/L (ref 3.5–5.1)
Sodium: 136 mmol/L (ref 135–145)
Total Bilirubin: 0.6 mg/dL (ref 0.3–1.2)
Total Protein: 6.7 g/dL (ref 6.5–8.1)

## 2021-10-17 LAB — GLUCOSE, CAPILLARY
Glucose-Capillary: 167 mg/dL — ABNORMAL HIGH (ref 70–99)
Glucose-Capillary: 193 mg/dL — ABNORMAL HIGH (ref 70–99)
Glucose-Capillary: 184 mg/dL — ABNORMAL HIGH (ref 70–99)
Glucose-Capillary: 343 mg/dL — ABNORMAL HIGH (ref 70–99)
Glucose-Capillary: 314 mg/dL — ABNORMAL HIGH (ref 70–99)
Glucose-Capillary: 296 mg/dL — ABNORMAL HIGH (ref 70–99)

## 2021-10-17 MED ORDER — PANTOPRAZOLE SODIUM 40 MG PO TBEC
40.0000 mg | DELAYED_RELEASE_TABLET | Freq: Every day | ORAL | Status: DC
Start: 2021-10-17 — End: 2021-10-22
  Administered 2021-10-17 – 2021-10-20 (×4): 40 mg via ORAL
  Filled 2021-10-17 (×4): qty 1

## 2021-10-17 MED ORDER — NITROGLYCERIN 0.4 MG SL SUBL
0.4000 mg | SUBLINGUAL_TABLET | SUBLINGUAL | Status: DC | PRN
  Administered 2021-10-17: 0.4 mg via SUBLINGUAL
  Filled 2021-10-17: qty 1

## 2021-10-17 MED ORDER — TRAZODONE HCL 50 MG PO TABS
200.0000 mg | ORAL_TABLET | Freq: Every evening | ORAL | Status: DC | PRN
  Administered 2021-10-17 – 2021-10-19 (×3): 200 mg via ORAL
  Filled 2021-10-17 (×3): qty 2

## 2021-10-17 MED ORDER — ISOSORBIDE MONONITRATE ER 30 MG PO TB24
30.0000 mg | ORAL_TABLET | Freq: Every day | ORAL | Status: DC
Start: 2021-10-17 — End: 2021-10-22
  Administered 2021-10-17 – 2021-10-20 (×4): 30 mg via ORAL
  Filled 2021-10-17 (×4): qty 1

## 2021-10-17 MED ORDER — OXYCODONE-ACETAMINOPHEN 5-325 MG PO TABS
2.0000 | ORAL_TABLET | Freq: Four times a day (QID) | ORAL | Status: DC | PRN
  Administered 2021-10-17 – 2021-10-27 (×17): 2 via ORAL
  Filled 2021-10-17 (×17): qty 2

## 2021-10-17 MED ORDER — GABAPENTIN 300 MG PO CAPS
300.0000 mg | ORAL_CAPSULE | Freq: Three times a day (TID) | ORAL | Status: DC
Start: 2021-10-17 — End: 2021-10-18
  Administered 2021-10-17 – 2021-10-18 (×4): 300 mg via ORAL
  Filled 2021-10-17 (×4): qty 1

## 2021-10-17 MED ORDER — GEMFIBROZIL 600 MG PO TABS
600.0000 mg | ORAL_TABLET | Freq: Two times a day (BID) | ORAL | Status: DC
  Administered 2021-10-17 – 2021-10-18 (×2): 600 mg via ORAL
  Filled 2021-10-17 (×2): qty 1

## 2021-10-17 MED ORDER — TORSEMIDE 20 MG PO TABS
20.0000 mg | ORAL_TABLET | Freq: Every day | ORAL | Status: DC
Start: 2021-10-17 — End: 2021-10-18
  Administered 2021-10-17: 20 mg via ORAL
  Filled 2021-10-17 (×2): qty 1

## 2021-10-17 NOTE — Progress Notes (Signed)
PROGRESS NOTE  Jeff Carruthers Sr. 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  Apparently, he took trazodone, Percocet and his wife gave him nitroglycerin after the symptoms started.  Neurology consulted.  Imaging on admission showed concern for a mass in the left posterior temporal lobe, measuring about 2.5 cm.  Neurology was concerned that he had epileptic aphasia versus seizures, he was placed on Keppra and admitted to the hospital.  Subjective / 24h Interval events: Remains confused this morning.  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  Complicating factor is the fact that he has been taking trazodone and Percocet shortly before coming here.  Continues to be confused this morning, needs an MRI with and without contrast to better determine the mass.  Continue Keppra.  Per neurology yesterday, no need for steroids -Unable to obtain MRI, he is too agitated and confused.  Discussed with neurology, will obtain with general anesthesia -Complains of severe headaches, start  home Percocet   Active problems Sinus bradycardia-apparently in the ED his heart rate was as low as 30s.  His home metoprolol is being held.  Monitor on telemetry, currently rates in the 70s  COVID-19 positive-incidental, high CT value at 38.  Has been having some URI type symptoms for the past couple of weeks, but hard to say whether a CT value represents an upcoming infection versus an improving one.  Would favor keeping on contact precautions for 5 days and if he has no further symptoms discontinue.  Remains asymptomatic   DM2, poorly controlled, with hyperglycemia-placed on sliding scale, hold home agents.  Resume gabapentin.  A1c elevated in the 9 range  CBG (last 3)  Recent Labs    10/16/21 2323 10/17/21 0301 10/17/21 0741  GLUCAP 231* 167* 193*    Essential hypertension-resume Imdur, torsemide.  ARB tomorrow if blood pressure stable   History of DVT-hold Eliquis for now until cleared by neurology/MRI with and without contrast  Coronary artery disease with history of stent-hold Plavix   Chronic hypoxic respiratory failure-on 2 L at home, not clear at this point whether his underlying COPD versus other things   Obesity, class II-BMI 39, he would benefit from weight loss  HLD - resume gemfibrozil   Scheduled Meds:  gabapentin  300 mg Oral TID   gemfibrozil  600 mg Oral BID AC   insulin aspart  0-9 Units Subcutaneous Q4H   isosorbide mononitrate  30 mg Oral Daily   pantoprazole  40 mg Oral Daily   torsemide  20 mg Oral Daily   Continuous Infusions:  levETIRAcetam 500 mg (10/17/21 0932)   PRN Meds:.acetaminophen **OR** acetaminophen, LORazepam, oxyCODONE-acetaminophen  Diet Orders (From admission, onward)     Start     Ordered   10/16/21 0802  Diet Heart Room service appropriate? Yes; Fluid consistency: Thin  Diet effective now       Question Answer Comment  Room service appropriate? Yes   Fluid consistency: Thin      10/16/21 0801            DVT  prophylaxis:    Lab Results  Component Value Date   PLT 166 10/15/2021      Code Status: Full Code  Family Communication: wife updated over the phone  Status is: Inpatient  Remains inpatient appropriate because: remains confused   Level of care: Telemetry Medical  Consultants:  Neurology  Procedures:  none  Microbiology  none  Antimicrobials: none    Objective: Vitals:   10/16/21 2148 10/16/21 2330 10/17/21 0257 10/17/21 0743  BP: 127/63 105/67 (!) 148/87 (!) 153/78  Pulse: 72 72 75 81  Resp: 16 17 20 17   Temp: 98.9 F (37.2 C) 98.6 F (37 C) 98.4 F (36.9 C) 98 F (36.7 C)  TempSrc: Axillary Temporal Axillary Oral  SpO2: 95% 97%  93%  Weight: 119.7 kg     Height:        Intake/Output Summary (Last 24 hours) at 10/17/2021 1014 Last data filed at 10/17/2021 0420 Gross per 24 hour  Intake 100 ml  Output --  Net 100 ml   Wt Readings from Last 3 Encounters:  10/16/21 119.7 kg    Examination:  Constitutional: NAD Eyes: no scleral icterus ENMT: Mucous membranes are moist.  Neck: normal, supple Respiratory: clear to auscultation bilaterally, no wheezing, no crackles. Normal respiratory effort.  Cardiovascular: Regular rate and rhythm, no murmurs / rubs / gallops. No LE edema. Good peripheral pulses Abdomen: non distended, no tenderness. Bowel sounds positive.  Musculoskeletal: no clubbing / cyanosis.  Skin: no rashes Neurologic: Remains confused.  Does not follow commands but appears nonfocal   Data Reviewed: I have independently reviewed following labs and imaging studies   CBC Recent Labs  Lab 10/15/21 2138 10/15/21 2148  WBC 4.7  --   HGB 14.0 13.9  HCT 43.4 41.0  PLT 166  --   MCV 99.5  --   MCH 32.1  --   MCHC 32.3  --   RDW 13.6  --   LYMPHSABS 1.1  --   MONOABS 0.7  --   EOSABS 0.2  --   BASOSABS 0.0  --     Recent Labs  Lab 10/15/21 2138 10/15/21 2148 10/16/21 0417  NA 137 138  --   K 4.0 4.0  --   CL 99 99  --    CO2 28  --   --   GLUCOSE 230* 226*  --   BUN 15 17  --   CREATININE 0.91 0.90  --   CALCIUM 9.1  --   --   AST 44*  --   --   ALT 42  --   --   ALKPHOS 48  --   --   BILITOT 0.4  --   --   ALBUMIN 3.6  --   --   CRP  --   --  0.8  DDIMER  --   --  <0.27  INR 1.2  --   --   TSH  --   --  1.798  HGBA1C  --   --  9.5*    ------------------------------------------------------------------------------------------------------------------ No results  for input(s): CHOL, HDL, LDLCALC, TRIG, CHOLHDL, LDLDIRECT in the last 72 hours.  Lab Results  Component Value Date   HGBA1C 9.5 (H) October 31, 2021   ------------------------------------------------------------------------------------------------------------------ Recent Labs    October 31, 2021 0417  TSH 1.798    Cardiac Enzymes No results for input(s): CKMB, TROPONINI, MYOGLOBIN in the last 168 hours.  Invalid input(s): CK ------------------------------------------------------------------------------------------------------------------ No results found for: BNP  CBG: Recent Labs  Lab 10/31/2021 1122 2021/10/31 2018 October 31, 2021 2323 10/17/21 0301 10/17/21 0741  GLUCAP 288* 238* 231* 167* 193*    Recent Results (from the past 240 hour(s))  Resp Panel by RT-PCR (Flu A&B, Covid) Nasopharyngeal Swab     Status: Abnormal   Collection Time: 10/15/21 11:52 PM   Specimen: Nasopharyngeal Swab; Nasopharyngeal(NP) swabs in vial transport medium  Result Value Ref Range Status   SARS Coronavirus 2 by RT PCR POSITIVE (A) NEGATIVE Final    Comment: (NOTE) SARS-CoV-2 target nucleic acids are DETECTED.  The SARS-CoV-2 RNA is generally detectable in upper respiratory specimens during the acute phase of infection. Positive results are indicative of the presence of the identified virus, but do not rule out bacterial infection or co-infection with other pathogens not detected by the test. Clinical correlation with patient history and other diagnostic  information is necessary to determine patient infection status. The expected result is Negative.  Fact Sheet for Patients: EntrepreneurPulse.com.au  Fact Sheet for Healthcare Providers: IncredibleEmployment.be  This test is not yet approved or cleared by the [place] FDA and  has been authorized for detection and/or diagnosis of SARS-CoV-2 by FDA under an Emergency Use Authorization (EUA).  This EUA will remain in effect (meaning this test can be used) for the duration of  the COVID-19 declaration under Section 564(b)(1) of the A ct, 21 U.S.C. section 360bbb-3(b)(1), unless the authorization is terminated or revoked sooner.     Influenza A by PCR NEGATIVE NEGATIVE Final   Influenza B by PCR NEGATIVE NEGATIVE Final    Comment: (NOTE) The Xpert Xpress SARS-CoV-2/FLU/RSV plus assay is intended as an aid in the diagnosis of influenza from Nasopharyngeal swab specimens and should not be used as a sole basis for treatment. Nasal washings and aspirates are unacceptable for Xpert Xpress SARS-CoV-2/FLU/RSV testing.  Fact Sheet for Patients: EntrepreneurPulse.com.au  Fact Sheet for Healthcare Providers: IncredibleEmployment.be  This test is not yet approved or cleared by the [place] FDA and has been authorized for detection and/or diagnosis of SARS-CoV-2 by FDA under an Emergency Use Authorization (EUA). This EUA will remain in effect (meaning this test can be used) for the duration of the COVID-19 declaration under Section 564(b)(1) of the Act, 21 U.S.C. section 360bbb-3(b)(1), unless the authorization is terminated or revoked.  Performed at [facility], [place] [address] [place], [place] [address]      Radiology Studies: EEG adult  Result Date: 10-31-21 Greta Doom, MD     October 31, 2021  1:46 PM History: 66 yo M with AMS with left temporal mass Sedation: none Technique: This EEG was  acquired with electrodes placed according to the International 10-20 electrode system (including Fp1, Fp2, F3, F4, C3, C4, P3, P4, O1, O2, T3, T4, T5, T6, A1, A2, Fz, Cz, Pz). The following electrodes were missing or displaced: none. Background: There is a posterior dominant rhythm of 9 Hz. There is focal irregular delta activity maximal in the left temporal region. In addition, there are occasioanl left temporal sharp waves, T7, P7 > F7 Photic stimulation: Physiologic driving is not performed.  EEG Abnormalities: 1) Left temporal sharp waves 2) Left temporal slow activity Clinical Interpretation: This EEG is consistent with an area of potential epileptogenicity in the left temporal region. There was no seizure recorded on this study. Please note that lack of epileptiform activity on EEG does not preclude the possibility of epilepsy. Roland Rack, MD [facility] [phone] If 7pm- 7am, please page neurology on call as listed in [place].     Marzetta Board, MD, PhD [facility]  Between 7 am - 7 pm I am available, please contact me via Amion (for emergencies) or Securechat (non urgent messages)  Between 7 pm - 7 am I am not available, please contact night coverage MD/APP via Amion

## 2021-10-17 NOTE — Progress Notes (Signed)
This RN noted pt wife attempting to get pt out of bed without staff assistance. Educated pt wife on importance of using call bell to ask for assistance to get patient out of bed to prevent a fall. She verbalized understanding. Reoriented pt wife to call bell.

## 2021-10-17 NOTE — Progress Notes (Signed)
°   10/17/21 1652  Assess: MEWS Score  BP (!) 143/71  Pulse Rate (!) 111  ECG Heart Rate (!) 111  Resp (!) 24  SpO2 95 %  Assess: MEWS Score  MEWS Temp 0  MEWS Systolic 0  MEWS Pulse 2  MEWS RR 1  MEWS LOC 0  MEWS Score 3  MEWS Score Color Yellow  Assess: if the MEWS score is Yellow or Red  Were vital signs taken at a resting state? Yes  Focused Assessment No change from prior assessment  Early Detection of Sepsis Score *See Row Information* Low  MEWS guidelines implemented *See Row Information* No, previously yellow, continue vital signs every 4 hours  Treat  MEWS Interventions Administered prn meds/treatments;Escalated (See documentation below)  Pain Scale CPOT  Pain Score 4  Take Vital Signs  Increase Vital Sign Frequency  Yellow: Q 2hr X 2 then Q 4hr X 2, if remains yellow, continue Q 4hrs  Escalate  MEWS: Escalate Yellow: discuss with charge nurse/RN and consider discussing with provider and RRT  Notify: Charge Nurse/RN  Name of Charge Nurse/RN Notified Mika RN  Date Charge Nurse/RN Notified 10/17/21  Time Charge Nurse/RN Notified 1700  Notify: Provider  Provider Name/Title Dr. Cruzita Lederer  Date Provider Notified 10/17/21  Time Provider Notified 1656  Notification Type Page  Notification Reason Change in status  Provider response See new orders  Date of Provider Response 10/17/21  Time of Provider Response [id number]  Document  Patient Outcome Stabilized after interventions  Progress note created (see row info) Yes

## 2021-10-17 NOTE — Progress Notes (Signed)
Pt wife states pt c/o chest pain upon assessment pt unable to verbalize chest pain. No changes to assessment noted. Pt wife states pt takes nitro 2-3 times a week. Pt HR noted to be elevated at 111-116, BP 143/71, RR 24, O2 sat 95% on RA. MD notified. See new orders.   1748- VS reassessed see flow sheet.

## 2021-10-17 NOTE — Progress Notes (Signed)
S: Patient remains confused. Was unable to tolerate MRI last night x2 2/2 inability to follow directions or lay still.  Interim data: Routine EEG showed L temporal sharp waves, no seizures  O:  Vitals:   10/17/21 1748 10/17/21 1905  BP: 126/71 110/62  Pulse: (!) 112 (!) 111  Resp: 15 20  Temp:  98.8 F (37.1 C)  SpO2: 94% 95%   General: Laying comfortably in bed; in no acute distress.  HENT: Normal oropharynx and mucosa. Normal external appearance of ears and nose.  Neck: Supple, no pain or tenderness  CV: No JVD. No peripheral edema.  Pulmonary: Symmetric Chest rise. Normal respiratory effort.  Abdomen: Soft to touch, non-tender.  Ext: No cyanosis, edema, or deformity  Skin: No rash. Normal palpation of skin.   Musculoskeletal: Normal digits and nails by inspection. No clubbing.    Neurologic Examination   Mental status/Cognition: More alert today, unable to answer orientation questions (just says yeah/ok or "I want my sandwich"). Follows some simple commands but inconsistently. Speech/language: responds with yeah and okay to any question.  Cranial nerves:   CN II Pupils equal and reactive to light, blinks to threat bilat   CN III,IV,VI EOM intact to dolls eyes, no gaze preference or deviation, no nystagmus   CN V Corneals intact BL   CN VII no asymmetry, no nasolabial fold flattening   CN VIII Does not turn head to speech   CN IX & X Unable to assess.,   CN XI Head is midline   CN XII midline tongue but does not protrude on command.    Motor/sensory:  Muscle bulk: normal, tone normal   Unable to do detailed strength testing secondary to inability to consistently follow commands.  He is spontaneously moving all arms and legs and localizes in bilateral upper extremity and withdraws to Babinski in bilateral lower extremities.   Reflexes:   Right Left Comments  Pectoralis         Biceps (C5/6) 1 1 Reflexes were limited by body habitus  Brachioradialis (C5/6) 1 1      Triceps (C6/7) 1 1     Patellar (L3/4) 1 1     Achilles (S1)         Hoffman         Plantar        Jaw jerk       Coordination/Complex Motor:  Unable to assess with no obvious ataxia noted. - Gait: Deferred for patient safety.  A/P: Jeff Payment Sr. is a 66 y.o. 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 Found to have a hyperemic left posterior temporal lobe lesion concerning for a potential mass. I suspect that he probably had epileptic aphasia vs seizure. He is more alert today but still extremely altered. rEEG showed L temporal sharps but no seizures.   Impression: - Brain mass - Epileptic aphasia.  Plan: - Continue keppra 500mg  bid - No discrete seizure activity since admission, hold off on further EEG for now but will consider LTM after MRI brain completed if patient has fluctuating mental status - MRI brain wwo contrast to be performed under general anesthesia; d/w Dr. Renne Crigler  I spent 25 minutes at bedside with patient, wife, and daughter discussing findings, plan, and showing family imaging.  Jeff Monks, MD [facility] [phone]  If 7pm- 7am, please page neurology on call as listed in [place].

## 2021-10-17 NOTE — Progress Notes (Signed)
°  Transition of Care [facility]) Screening Note   Patient Details  Name: Jeff Gleaves Sr. Date of Birth: 02/19/1956   Transition of Care [facility]) CM/SW Contact:    Alfredia Ferguson, LCSW Phone Number: 10/17/2021, 8:28 AM    Transition of Care Department [facility]) has reviewed patient and noted no immediate TOC needs pending continued medical work-up. TOC team will continue to monitor patient advancement through interdisciplinary progression rounds to support any identified discharge supports as needed. If new patient transition needs arise, please place a TOC consult or reach out to [facility] team.

## 2021-10-18 ENCOUNTER — Inpatient Hospital Stay (HOSPITAL_COMMUNITY): Payer: Medicare Other | Admitting: Anesthesiology

## 2021-10-18 ENCOUNTER — Encounter (HOSPITAL_COMMUNITY)
Admission: EM | Disposition: A | Discharge status: Rehabilitation Facility | Payer: Self-pay | Source: Home / Self Care | Attending: Internal Medicine

## 2021-10-18 ENCOUNTER — Encounter (HOSPITAL_COMMUNITY): Payer: Self-pay | Admitting: Internal Medicine

## 2021-10-18 ENCOUNTER — Inpatient Hospital Stay (HOSPITAL_COMMUNITY): Payer: Medicare Other

## 2021-10-18 DIAGNOSIS — E785 Hyperlipidemia, unspecified: Secondary | ICD-10-CM

## 2021-10-18 DIAGNOSIS — E119 Type 2 diabetes mellitus without complications: Secondary | ICD-10-CM

## 2021-10-18 DIAGNOSIS — D496 Neoplasm of unspecified behavior of brain: Secondary | ICD-10-CM

## 2021-10-18 DIAGNOSIS — Z86718 Personal history of other venous thrombosis and embolism: Secondary | ICD-10-CM

## 2021-10-18 DIAGNOSIS — I2510 Atherosclerotic heart disease of native coronary artery without angina pectoris: Secondary | ICD-10-CM

## 2021-10-18 DIAGNOSIS — I10 Essential (primary) hypertension: Secondary | ICD-10-CM

## 2021-10-18 DIAGNOSIS — R001 Bradycardia, unspecified: Secondary | ICD-10-CM

## 2021-10-18 DIAGNOSIS — E876 Hypokalemia: Secondary | ICD-10-CM

## 2021-10-18 DIAGNOSIS — R410 Disorientation, unspecified: Secondary | ICD-10-CM

## 2021-10-18 DIAGNOSIS — J449 Chronic obstructive pulmonary disease, unspecified: Secondary | ICD-10-CM

## 2021-10-18 DIAGNOSIS — U071 COVID-19: Secondary | ICD-10-CM

## 2021-10-18 DIAGNOSIS — Z9861 Coronary angioplasty status: Secondary | ICD-10-CM

## 2021-10-18 HISTORY — PX: RADIOLOGY WITH ANESTHESIA: SHX6223

## 2021-10-18 LAB — BASIC METABOLIC PANEL
Anion gap: 13 (ref 5–15)
BUN: 17 mg/dL (ref 8–23)
CO2: 26 mmol/L (ref 22–32)
Calcium: 8.8 mg/dL — ABNORMAL LOW (ref 8.9–10.3)
Chloride: 99 mmol/L (ref 98–111)
Creatinine, Ser: 1.06 mg/dL (ref 0.61–1.24)
GFR, Estimated: >60 mL/min (ref >60)
Glucose, Bld: 266 mg/dL — ABNORMAL HIGH (ref 70–99)
Potassium: 3.3 mmol/L — ABNORMAL LOW (ref 3.5–5.1)
Sodium: 138 mmol/L (ref 135–145)

## 2021-10-18 LAB — CBC
HCT: 37.7 % — ABNORMAL LOW (ref 39.0–52.0)
Hemoglobin: 13 g/dL (ref 13.0–17.0)
MCH: 32.7 pg (ref 26.0–34.0)
MCHC: 34.5 g/dL (ref 30.0–36.0)
MCV: 95 fL (ref 80.0–100.0)
Platelets: 160 10*3/uL (ref 150–400)
RBC: 3.97 MIL/uL — ABNORMAL LOW (ref 4.22–5.81)
RDW: 13.6 % (ref 11.5–15.5)
WBC: 4.9 10*3/uL (ref 4.0–10.5)
nRBC: 0 % (ref 0.0–0.2)

## 2021-10-18 LAB — GLUCOSE, CAPILLARY
Glucose-Capillary: 291 mg/dL — ABNORMAL HIGH (ref 70–99)
Glucose-Capillary: 383 mg/dL — ABNORMAL HIGH (ref 70–99)
Glucose-Capillary: 255 mg/dL — ABNORMAL HIGH (ref 70–99)
Glucose-Capillary: 246 mg/dL — ABNORMAL HIGH (ref 70–99)
Glucose-Capillary: 213 mg/dL — ABNORMAL HIGH (ref 70–99)
Glucose-Capillary: 235 mg/dL — ABNORMAL HIGH (ref 70–99)

## 2021-10-18 LAB — MAGNESIUM: Magnesium: 1.9 mg/dL (ref 1.7–2.4)

## 2021-10-18 IMAGING — MR MR HEAD WO/W CM
4 of 13 series · 9 of 48 positions shown · IV contrast (yes GAD)
Comparison: Prior CTs from [DATE].

CLINICAL DATA: Initial evaluation for brain/CNS neoplasm, staging.

EXAM:
MRI HEAD WITHOUT AND WITH CONTRAST
TECHNIQUE: Multiplanar, multiecho pulse sequences of the brain and surrounding
structures were obtained without and with intravenous contrast.
CONTRAST:  10mL GADAVIST GADOBUTROL 1 MMOL/ML IV SOLN

[Series 2: DWI · axial · 3.0mm · 0.94mm/px · z∈[-9,+45]mm · 2 of 108 slices shown]
[im 18/108]
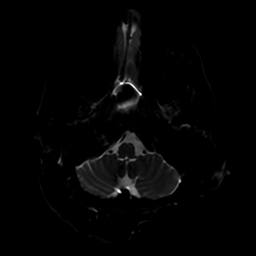
[im 54/108]
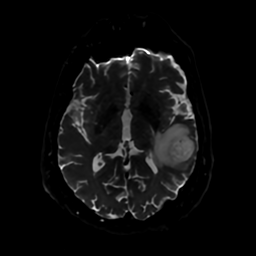

[Series 4: FLAIR · sagittal · 5.0mm · 0.23mm/px · 2 of 25 slices shown (1 of 2)]
[im 1/25]
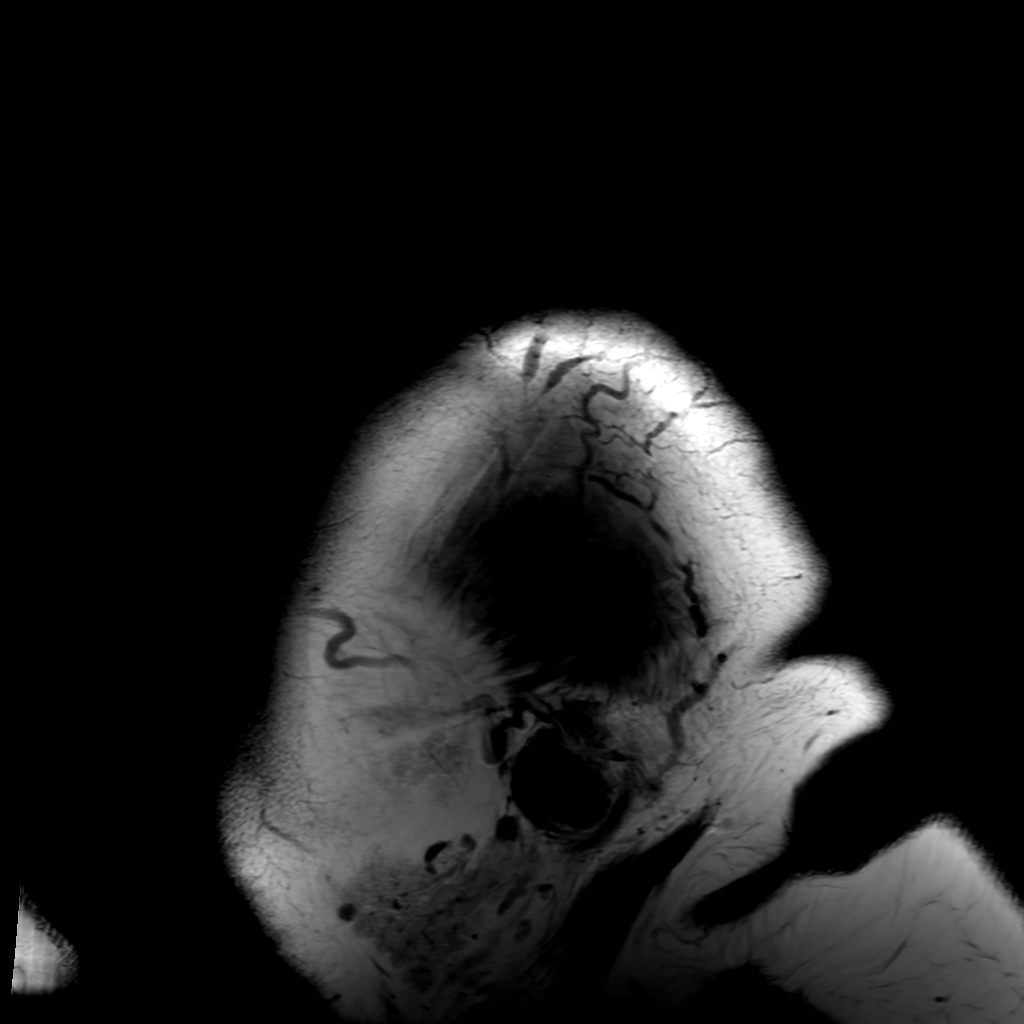
[im 25/25]
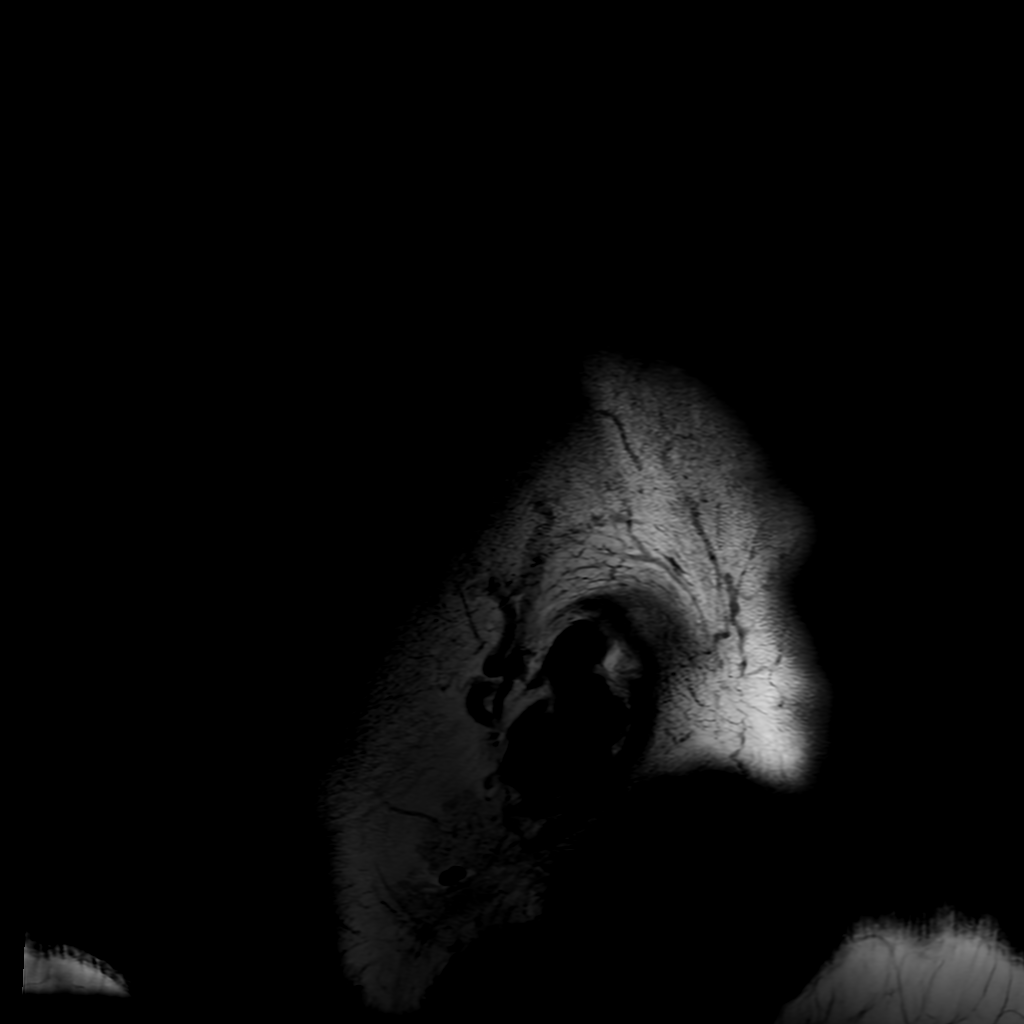

[Series 6: FLAIR · axial · 4.0mm · 0.45mm/px · z∈[-33,+123]mm · 3 of 37 slices shown (2 of 2)]
[im 1/37]
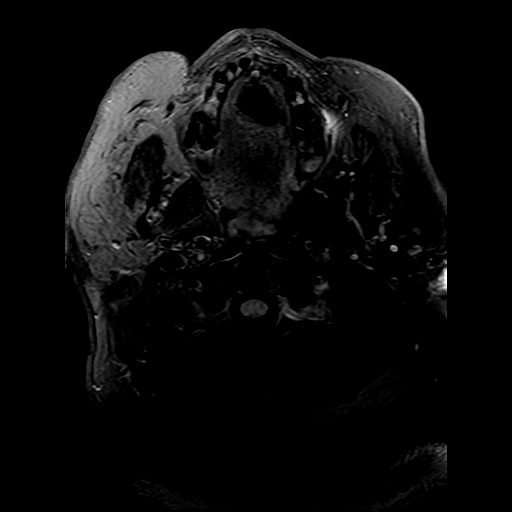
[im 19/37]
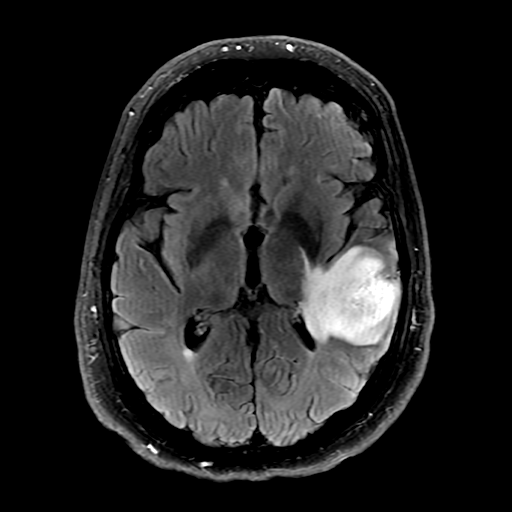
[im 37/37]
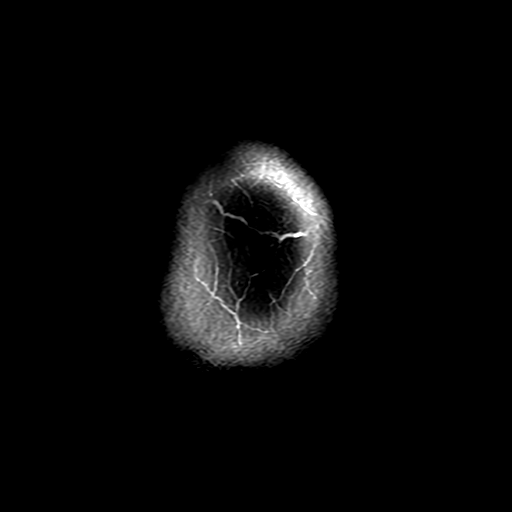

[Series 12: FLAIR post-contrast · sagittal · 5.0mm · 0.23mm/px · 2 of 25 slices shown]
[im 1/25]
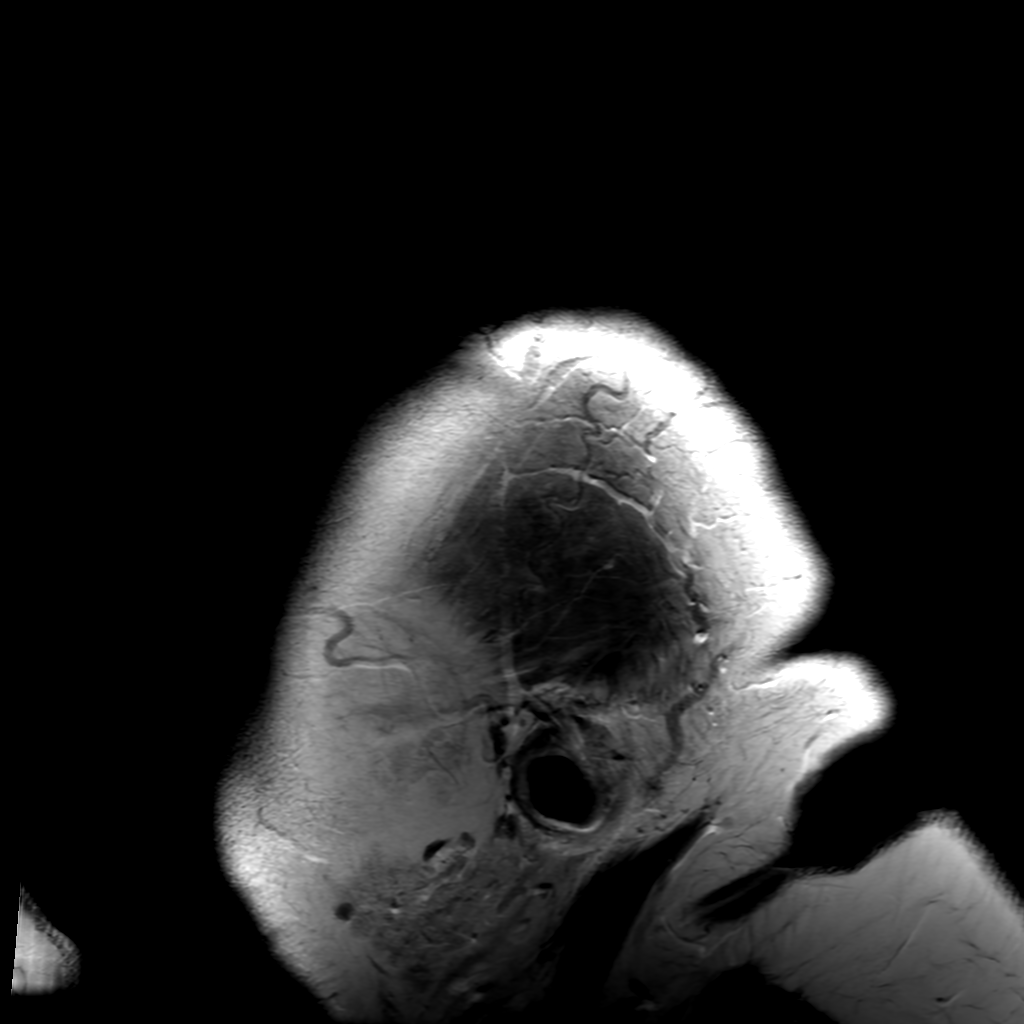
[im 25/25]
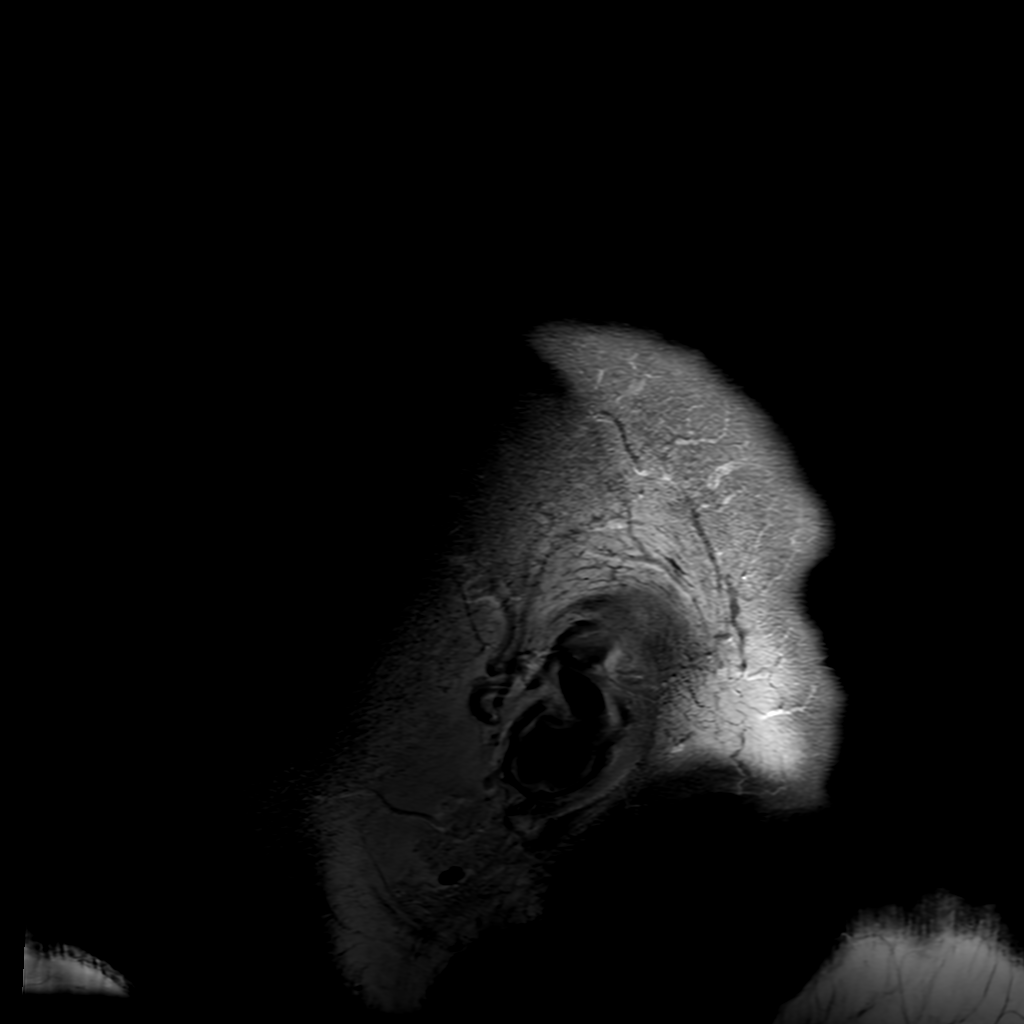

[9 of 48 positions shown; findings below may reference images not displayed]

FINDINGS: Brain: Cerebral volume within normal limits. Mild scattered patchy
T2/FLAIR hyperintensity noted involving the periventricular and deep
white matter both cerebral hemispheres, nonspecific, but most like
related chronic microvascular ischemic disease, overall mild in
nature in felt to be within normal limits for age.

No evidence for acute or subacute infarct. No areas of chronic
cortical infarction. No acute intracranial hemorrhage.

Rounded mass positioned at the left temporal lobe measures 2.4 x
x 2.8 cm (AP by transverse by craniocaudad). Lesion demonstrates
hyperintense T2 signal intensity with avid heterogeneous and
predominantly peripheral postcontrast enhancement. Speckled areas of
internal susceptibility artifact consistent with necrosis and/or
blood products. Surrounding T2/FLAIR signal intensity consistent
with vasogenic edema and/or infiltrating tumor. Mild mass effect on
the atrium of the left lateral ventricle without significant midline
shift. Finding concerning for a high-grade primary CNS neoplasm.

No other mass lesion or abnormal enhancement. No hydrocephalus or
ventricular trapping. No extra-axial fluid collection. Pituitary
gland suprasellar region normal. Midline structures normal.

Vascular: Major intracranial vascular flow voids are maintained.

Skull and upper cervical spine: Craniocervical junction within
normal limits. Bone marrow signal intensity within normal limits. No
focal marrow replacing lesion. No scalp soft tissue abnormality.

Sinuses/Orbits: Globes and orbital soft tissues demonstrate no acute
finding. Scattered mucosal thickening noted within the ethmoidal air
cells. Fluid seen layering within the nasopharynx. Small bilateral
mastoid effusions. Patient is intubated.

Other: None.
IMPRESSION: 1. 2.4 x 2.7 x 2.8 cm solitary and avidly enhancing mass involving
the posterior left temporal lobe, concerning for a high-grade
primary CNS neoplasm. Surrounding vasogenic edema and/or
infiltrating tumor without significant midline shift.
2. No other acute intracranial abnormality.

## 2021-10-18 SURGERY — MRI WITH ANESTHESIA
Anesthesia: General

## 2021-10-18 MED ORDER — INSULIN ASPART 100 UNIT/ML IJ SOLN
0.0000 [IU] | Freq: Three times a day (TID) | INTRAMUSCULAR | Status: DC
  Administered 2021-10-18: 7 [IU] via SUBCUTANEOUS
  Administered 2021-10-19: 4 [IU] via SUBCUTANEOUS
  Administered 2021-10-19: 11 [IU] via SUBCUTANEOUS

## 2021-10-18 MED ORDER — ATORVASTATIN CALCIUM 40 MG PO TABS
40.0000 mg | ORAL_TABLET | Freq: Every day | ORAL | Status: DC
  Administered 2021-10-19 – 2021-10-20 (×2): 40 mg via ORAL
  Filled 2021-10-18 (×3): qty 1

## 2021-10-18 MED ORDER — METOPROLOL TARTRATE 5 MG/5ML IV SOLN
5.0000 mg | INTRAVENOUS | Status: DC | PRN
  Administered 2021-10-18: 5 mg via INTRAVENOUS
  Filled 2021-10-18: qty 5

## 2021-10-18 MED ORDER — LIDOCAINE 2% (20 MG/ML) 5 ML SYRINGE
INTRAMUSCULAR | Status: DC | PRN
  Administered 2021-10-18: 30 mg via INTRAVENOUS

## 2021-10-18 MED ORDER — ROCURONIUM BROMIDE 10 MG/ML (PF) SYRINGE
PREFILLED_SYRINGE | INTRAVENOUS | Status: DC | PRN
  Administered 2021-10-18: 50 mg via INTRAVENOUS

## 2021-10-18 MED ORDER — FLUTICASONE-UMECLIDIN-VILANT 200-62.5-25 MCG/ACT IN AEPB: INHALATION_SPRAY | Freq: Every day | RESPIRATORY_TRACT | Status: DC

## 2021-10-18 MED ORDER — PROPOFOL 10 MG/ML IV BOLUS
INTRAVENOUS | Status: AC
  Filled 2021-10-18: qty 20

## 2021-10-18 MED ORDER — METOPROLOL TARTRATE 5 MG/5ML IV SOLN
5.0000 mg | Freq: Four times a day (QID) | INTRAVENOUS | Status: DC
  Administered 2021-10-19 – 2021-10-23 (×14): 5 mg via INTRAVENOUS
  Filled 2021-10-18 (×15): qty 5

## 2021-10-18 MED ORDER — METOPROLOL TARTRATE 5 MG/5ML IV SOLN
5.0000 mg | Freq: Three times a day (TID) | INTRAVENOUS | Status: DC
  Administered 2021-10-18: 5 mg via INTRAVENOUS
  Filled 2021-10-18: qty 5

## 2021-10-18 MED ORDER — POTASSIUM CHLORIDE 20 MEQ PO PACK
40.0000 meq | PACK | ORAL | Status: AC
  Administered 2021-10-18: 40 meq via ORAL
  Filled 2021-10-18: qty 2

## 2021-10-18 MED ORDER — SUCCINYLCHOLINE CHLORIDE 200 MG/10ML IV SOSY
PREFILLED_SYRINGE | INTRAVENOUS | Status: DC | PRN
  Administered 2021-10-18: 140 mg via INTRAVENOUS

## 2021-10-18 MED ORDER — FENTANYL CITRATE PF 50 MCG/ML IJ SOSY
25.0000 ug | PREFILLED_SYRINGE | INTRAMUSCULAR | Status: DC | PRN
  Administered 2021-10-18: 25 ug via INTRAVENOUS
  Filled 2021-10-18: qty 1

## 2021-10-18 MED ORDER — FENTANYL CITRATE PF 50 MCG/ML IJ SOSY
25.0000 ug | PREFILLED_SYRINGE | INTRAMUSCULAR | Status: DC | PRN
  Administered 2021-10-18: 50 ug via INTRAVENOUS
  Filled 2021-10-18: qty 1

## 2021-10-18 MED ORDER — FENTANYL CITRATE (PF) 250 MCG/5ML IJ SOLN
INTRAMUSCULAR | Status: AC
  Filled 2021-10-18: qty 5

## 2021-10-18 MED ORDER — FENTANYL CITRATE PF 50 MCG/ML IJ SOSY: 25.0000 ug | PREFILLED_SYRINGE | INTRAMUSCULAR | Status: DC | PRN

## 2021-10-18 MED ORDER — FENTANYL CITRATE (PF) 250 MCG/5ML IJ SOLN
INTRAMUSCULAR | Status: DC | PRN
  Administered 2021-10-18: 100 ug via INTRAVENOUS

## 2021-10-18 MED ORDER — UMECLIDINIUM BROMIDE 62.5 MCG/ACT IN AEPB
1.0000 | INHALATION_SPRAY | Freq: Every day | RESPIRATORY_TRACT | Status: DC
  Filled 2021-10-18 (×2): qty 7

## 2021-10-18 MED ORDER — ONDANSETRON HCL 4 MG/2ML IJ SOLN
INTRAMUSCULAR | Status: DC | PRN
  Administered 2021-10-18: 4 mg via INTRAVENOUS

## 2021-10-18 MED ORDER — INSULIN ASPART 100 UNIT/ML IJ SOLN
0.0000 [IU] | Freq: Every day | INTRAMUSCULAR | Status: DC
  Administered 2021-10-18: 2 [IU] via SUBCUTANEOUS

## 2021-10-18 MED ORDER — LACTATED RINGERS IV SOLN: INTRAVENOUS | Status: DC | PRN

## 2021-10-18 MED ORDER — PROPOFOL 10 MG/ML IV BOLUS
INTRAVENOUS | Status: DC | PRN
  Administered 2021-10-18: 170 mg via INTRAVENOUS

## 2021-10-18 MED ORDER — SUGAMMADEX SODIUM 200 MG/2ML IV SOLN
INTRAVENOUS | Status: DC | PRN
  Administered 2021-10-18: 100 mg via INTRAVENOUS
  Administered 2021-10-18: 50 mg via INTRAVENOUS
  Administered 2021-10-18: 200 mg via INTRAVENOUS

## 2021-10-18 MED ORDER — GADOBUTROL 1 MMOL/ML IV SOLN
10.0000 mL | Freq: Once | INTRAVENOUS | Status: AC | PRN
  Administered 2021-10-18: 10 mL via INTRAVENOUS

## 2021-10-18 MED ORDER — FLUTICASONE FUROATE-VILANTEROL 200-25 MCG/ACT IN AEPB
1.0000 | INHALATION_SPRAY | Freq: Every day | RESPIRATORY_TRACT | Status: DC
  Filled 2021-10-18 (×2): qty 28

## 2021-10-18 MED ORDER — IPRATROPIUM-ALBUTEROL 0.5-2.5 (3) MG/3ML IN SOLN: 3.0000 mL | Freq: Four times a day (QID) | RESPIRATORY_TRACT | Status: DC | PRN

## 2021-10-18 MED ORDER — MONTELUKAST SODIUM 10 MG PO TABS
10.0000 mg | ORAL_TABLET | Freq: Every evening | ORAL | Status: DC
Start: 2021-10-18 — End: 2021-10-22
  Administered 2021-10-19: 18:00:00 10 mg via ORAL
  Filled 2021-10-18 (×4): qty 1

## 2021-10-18 NOTE — Anesthesia Preprocedure Evaluation (Addendum)
Anesthesia Evaluation  Patient identified by MRN, date of birth, ID band Patient confused    Reviewed: Allergy & Precautions, NPO status , Patient's Chart, lab work & pertinent test results  Airway Mallampati: II  TM Distance: >3 FB Neck ROM: Full    Dental  (+) Poor Dentition   Pulmonary COPD (2L [place] ),  oxygen dependent,  COVID+   Pulmonary exam normal        Cardiovascular hypertension, Pt. on medications and Pt. on home beta blockers + CAD (on plavix), + Cardiac Stents and + DVT (on Eliquis)   Rhythm:Regular Rate:Normal     Neuro/Psych New brain mass. Unable to tolerate MRI without anesthesia 2/2 patient cooperation.  CVA, Residual Symptoms negative psych ROS   GI/Hepatic Neg liver ROS, GERD  Medicated,  Endo/Other  diabetes, Type 2, Oral Hypoglycemic Agents  Renal/GU   negative genitourinary   Musculoskeletal negative musculoskeletal ROS (+)   Abdominal Normal abdominal exam  (+)   Peds  Hematology negative hematology ROS (+)   Anesthesia Other Findings   Reproductive/Obstetrics                           Anesthesia Physical Anesthesia Plan  ASA: 4  Anesthesia Plan: General   Post-op Pain Management:    Induction: Intravenous and Rapid sequence  PONV Risk Score and Plan: 2 and Ondansetron, Dexamethasone and Treatment may vary due to age or medical condition  Airway Management Planned: Mask and Oral ETT  Additional Equipment: None  Intra-op Plan:   Post-operative Plan: Possible Post-op intubation/ventilation  Informed Consent: I have reviewed the patients History and Physical, chart, labs and discussed the procedure including the risks, benefits and alternatives for the proposed anesthesia with the patient or authorized representative who has indicated his/her understanding and acceptance.     Dental advisory given  Plan Discussed with:   Anesthesia Plan Comments: (Lab  Results      Component                Value               Date                      WBC                      4.9                 10/18/2021                HGB                      13.0                10/18/2021                HCT                      37.7 (L)            10/18/2021                MCV                      95.0                10/18/2021  PLT                      160                 10/18/2021           Lab Results      Component                Value               Date                      NA                       138                 10/18/2021                K                        3.3 (L)             10/18/2021                CO2                      26                  10/18/2021                GLUCOSE                  266 (H)             10/18/2021                BUN                      17                  10/18/2021                CREATININE               1.06                10/18/2021                CALCIUM                  8.8 (L)             10/18/2021                GFRNONAA                 >60                 10/18/2021          )      Anesthesia Quick Evaluation

## 2021-10-18 NOTE — Progress Notes (Signed)
LTM EEG hooked up and running - no initial skin breakdown - push button tested - neuro notified. Atrium monitoring.  

## 2021-10-18 NOTE — Assessment & Plan Note (Addendum)
Estimated body mass index is 37.86 kg/m as calculated from the following:   Height as of this encounter: 5\' 10"  (1.778 m).   Weight as of this encounter: 119.7 kg.

## 2021-10-18 NOTE — Progress Notes (Signed)
PROGRESS NOTE  Jeff Wood. YQM:[id number] DOB: 26-Nov-1955   PCP: Pcp, No  Patient is from: Lives with wife.  Independently ambulates at baseline.  DOA: 10/15/2021 LOS: 2  Chief complaints:  Chief Complaint  Patient presents with   Code Stroke     Brief Narrative / Interim history: 66 year old M with PMH of CAD/remote stent on Plavix, chronic RF on 2 L, COPD, chronic pain on opiate, extensive DVT on Eliquis, DM-2, HTN, HLD and morbid obesity presenting with acute onset confusion and aphasia with concern for CVA.  Reportedly took 2 of the 200 mg trazodone and he is the pain medication prior to arrival.  UDS negative.  He tested positive for COVID-19 but symptomatic for over 2 weeks.  CT head concerning for 3 to 4 cm region of subacute infarction versus mass in left posterior temporal lobe.  CT angio head and neck and CT cerebral perfusion with contrast without LVO but showed 2.5 cm hyperemic mass in left posterior temporal lobe.  MRI brain without contrast also showed mass lesion of the left temporal lobe with associated edema but truncated exam due to patient's inability to tolerate setting of mental status change.  Concern about epileptic aphasia from the brain mass.  EEG without seizure or epileptiform discharge.  Neurology recommended MRI brain under general anesthesia which is a scheduled for 5 PM on 10/18/2021. He will have continuous EEG after MRI brain.  Patient remains confused with aphasia.    Subjective: Seen and examined earlier this morning.  No major events overnight of this morning.  Patient's wife at bedside.  He remains confused with aphasia but not somnolent.  Patient's wife concerned about torsemide.  She states he usually takes furosemide due to dehydration with torsemide.  He had frequent urination overnight.  He has no respiratory distress.  Creatinine is slightly up.  Objective: Vitals:   10/17/21 1930 10/17/21 2316 10/18/21 0335 10/18/21 0928  BP: 126/80  138/62 122/75 124/70  Pulse: (!) 108 (!) 104 88 100  Resp: 20 20 18 14   Temp: 98.1 F (36.7 C) 98 F (36.7 C) 98.8 F (37.1 C) 98.9 F (37.2 C)  TempSrc: Oral Oral Oral Oral  SpO2: 93% 91%  91%  Weight:      Height:        Examination:  GENERAL: No apparent distress.  Nontoxic. HEENT: MMM.  Vision and hearing grossly intact.  NECK: Supple.  Difficult to assess JVD due to body habitus. RESP: 91% on RA.  No IWOB.  Fair aeration bilaterally. CVS:  RRR. Heart sounds normal.  ABD/GI/GU: BS+. Abd soft, NTND.  MSK/EXT:  Moves extremities. No apparent deformity.  Trace edema through compression socks. SKIN: no apparent skin lesion or wound NEURO: Awake and alert.  Only oriented to self.  He thinks his wife is his mother.  Has aphasia. Motor 4 /5 in all muscle groups of UE and LE bilaterally.  Light sensation and reflexes seems to be intact.  Further neuro exam limited due to patient's inability to follow and instructions PSYCH: Calm.  No distress or agitation.  Procedures:  None  Microbiology summarized: COVID-19 PCR positive.  Assessment and Plan: * Acute toxic metabolic encephalopathy with aphasia with new left temporal mass- (present on admission) Acute onset.  Does not seem to have other focal neuro deficit but limited exam due to confusion.  He is only oriented to himself.  He thinks his wife is his mother.  Concern about epileptic aphasia with new temporal  mass as noted on imaging.  He is also at risk for polypharmacy.  Reportedly took 2 of the 200 mg trazodone in addition to his other medications prior to presentation.  UDS, EtOH and TSH negative.  Currently not somnolent to suspect hypercapnia.  Neurology following. -Plan for MRI with anesthesia at 5 PM today followed by LTM EEG -We  keep n.p.o. for MRI -Continue Keppra per neurology. -Seizure, fall, delirium and aspiration precaution  -Minimize sedating medications -SLP/PT/OT eval  Sinus bradycardia- (present on  admission) Likely due to beta-blocker.  He was on metoprolol 100 mg twice daily at home -Holding home metoprolol while NPO. -Scheduled IV metoprolol as above -Continue telemetry monitoring  Lab test positive for detection of COVID-19 virus- (present on admission) Previous attending discussed with lab (CT value 38.1).  Per wife, patient has received initial 2 doses of COVID-vaccine and 1 booster shot.  He started having cough and congestion 2 weeks ago.  Chronically uses 2.5 L supplemental oxygen and no change in oxygen requirement from baseline.  CXR without acute finding.  Inflammatory markers within normal. -Continue airborne precaution per hospital policy  Diabetes mellitus type 2, noninsulin dependent ([place]) Uncontrolled NIDDM-2 with hyperglycemia and other complications: F7T 0.2%.  Seems she is only on glipizide at home. Recent Labs  Lab 10/17/21 1553 10/17/21 1916 10/17/21 2302 10/18/21 0345 10/18/21 0752  GLUCAP 343* 314* 296* 291* 383*  -Change SSI from sensitive to obese scale. -Hold off basal insulin while n.p.o. -Resume home atorvastatin -He would benefit from GLP-1 agonist given morbid obesity and cardiac history.   CAD S/P percutaneous coronary angioplasty No anginal symptoms. -Continue home Imdur after MRI brain.  Now NPO. -Scheduling IV metoprolol 5 mg every 8 hours now he is tachycardic. -Resume home p.o. metoprolol at low-dose and titrate after MRI. -Continue holding Hyzaar. -Stop torsemide.  Per wife, usually takes furosemide due to dehydration with torsemide. -Continue home meds  -Hold Plavix and Eliquis pending MRI  Essential hypertension Stable. -Cardiac meds as above.  History of DVT (deep vein thrombosis) -Hold Eliquis pending MRI brain.  COPD with chronic hypoxic respiratory failure On 2.5 L by  at baseline.  Stable. -Resume home Trelegy Ellipta -DuoNeb as needed  Hyperlipidemia - Atorvastatin as above.  Morbid obesity ([place]) Meets criteria  with his BMI and history of diabetes Body mass index is 37.86 kg/m. -Encourage lifestyle change to lose weight -Could consider GLP-1 agonist instead of glipizide to help with weight loss         Body mass index is 37.86 kg/m.         DVT prophylaxis:  Place and maintain sequential compression device Start: 10/17/21 1655  Code Status: Full code Family Communication: Updated patient's wife at bedside. Level of care: Telemetry Medical Status is: Inpatient Remains inpatient appropriate because: Due to acute encephalopathy, aphasia requiring further evaluation with MRI brain under general anesthesia    Final disposition: TBD     Consultants:  Neurology   Sch Meds:  Scheduled Meds:  atorvastatin  40 mg Oral Daily   Fluticasone-Umeclidin-Vilant   Inhalation Daily   insulin aspart  0-20 Units Subcutaneous TID WC   insulin aspart  0-5 Units Subcutaneous QHS   isosorbide mononitrate  30 mg Oral Daily   metoprolol tartrate  5 mg Intravenous Q8H   montelukast  10 mg Oral QPM   pantoprazole  40 mg Oral Daily   potassium chloride  40 mEq Oral Q4H   Continuous Infusions:  levETIRAcetam 500 mg (10/18/21  0924)   PRN Meds:.acetaminophen **OR** acetaminophen, ipratropium-albuterol, LORazepam, nitroGLYCERIN, oxyCODONE-acetaminophen, traZODone  Antimicrobials: Anti-infectives (From admission, onward)    None        I have personally reviewed the following labs and images: CBC: Recent Labs  Lab 10/15/21 2138 10/15/21 2148 10/17/21 1104 10/18/21 0320  WBC 4.7  --  5.1 4.9  NEUTROABS 2.6  --   --   --   HGB 14.0 13.9 13.4 13.0  HCT 43.4 41.0 39.4 37.7*  MCV 99.5  --  95.6 95.0  PLT 166  --  154 160   BMP &GFR Recent Labs  Lab 10/15/21 2138 10/15/21 2148 10/17/21 1104 10/18/21 0320  NA 137 138 136 138  K 4.0 4.0 3.7 3.3*  CL 99 99 100 99  CO2 28  --  24 26  GLUCOSE 230* 226* 186* 266*  BUN 15 17 12 17   CREATININE 0.91 0.90 0.85 1.06  CALCIUM 9.1  --   9.0 8.8*  MG  --   --   --  1.9   Estimated Creatinine Clearance: 87.7 mL/min (by C-G formula based on SCr of 1.06 mg/dL). Liver & Pancreas: Recent Labs  Lab 10/15/21 2138 10/17/21 1104  AST 44* 112*  ALT 42 58*  ALKPHOS 48 48  BILITOT 0.4 0.6  PROT 7.2 6.7  ALBUMIN 3.6 3.5   No results for input(s): LIPASE, AMYLASE in the last 168 hours. No results for input(s): AMMONIA in the last 168 hours. Diabetic: Recent Labs    10/16/21 0417  HGBA1C 9.5*   Recent Labs  Lab 10/17/21 1553 10/17/21 1916 10/17/21 2302 10/18/21 0345 10/18/21 0752  GLUCAP 343* 314* 296* 291* 383*   Cardiac Enzymes: No results for input(s): CKTOTAL, CKMB, CKMBINDEX, TROPONINI in the last 168 hours. No results for input(s): PROBNP in the last 8760 hours. Coagulation Profile: Recent Labs  Lab 10/15/21 2138  INR 1.2   Thyroid Function Tests: Recent Labs    10/16/21 0417  TSH 1.798   Lipid Profile: No results for input(s): CHOL, HDL, LDLCALC, TRIG, CHOLHDL, LDLDIRECT in the last 72 hours. Anemia Panel: Recent Labs    10/16/21 0417  FERRITIN 42   Urine analysis:    Component Value Date/Time   COLORURINE STRAW (A) 10/15/2021 2311   APPEARANCEUR CLEAR 10/15/2021 2311   LABSPEC 1.010 10/15/2021 2311   PHURINE 6.5 10/15/2021 2311   GLUCOSEU >=500 (A) 10/15/2021 2311   HGBUR TRACE (A) 10/15/2021 2311   BILIRUBINUR NEGATIVE 10/15/2021 2311   [place] 10/15/2021 2311   PROTEINUR NEGATIVE 10/15/2021 2311   NITRITE NEGATIVE 10/15/2021 2311   LEUKOCYTESUR NEGATIVE 10/15/2021 2311   Sepsis Labs: Invalid input(s): PROCALCITONIN, [place]  Microbiology: Recent Results (from the past 240 hour(s))  Resp Panel by RT-PCR (Flu A&B, Covid) Nasopharyngeal Swab     Status: Abnormal   Collection Time: 10/15/21 11:52 PM   Specimen: Nasopharyngeal Swab; Nasopharyngeal(NP) swabs in vial transport medium  Result Value Ref Range Status   SARS Coronavirus 2 by RT PCR POSITIVE (A) NEGATIVE  Final    Comment: (NOTE) SARS-CoV-2 target nucleic acids are DETECTED.  The SARS-CoV-2 RNA is generally detectable in upper respiratory specimens during the acute phase of infection. Positive results are indicative of the presence of the identified virus, but do not rule out bacterial infection or co-infection with other pathogens not detected by the test. Clinical correlation with patient history and other diagnostic information is necessary to determine patient infection status. The expected result is Negative.  Fact  Sheet for Patients: EntrepreneurPulse.com.au  Fact Sheet for Healthcare Providers: IncredibleEmployment.be  This test is not yet approved or cleared by the [place] FDA and  has been authorized for detection and/or diagnosis of SARS-CoV-2 by FDA under an Emergency Use Authorization (EUA).  This EUA will remain in effect (meaning this test can be used) for the duration of  the COVID-19 declaration under Section 564(b)(1) of the A ct, 21 U.S.C. section 360bbb-3(b)(1), unless the authorization is terminated or revoked sooner.     Influenza A by PCR NEGATIVE NEGATIVE Final   Influenza B by PCR NEGATIVE NEGATIVE Final    Comment: (NOTE) The Xpert Xpress SARS-CoV-2/FLU/RSV plus assay is intended as an aid in the diagnosis of influenza from Nasopharyngeal swab specimens and should not be used as a sole basis for treatment. Nasal washings and aspirates are unacceptable for Xpert Xpress SARS-CoV-2/FLU/RSV testing.  Fact Sheet for Patients: EntrepreneurPulse.com.au  Fact Sheet for Healthcare Providers: IncredibleEmployment.be  This test is not yet approved or cleared by the [place] FDA and has been authorized for detection and/or diagnosis of SARS-CoV-2 by FDA under an Emergency Use Authorization (EUA). This EUA will remain in effect (meaning this test can be used) for the duration of  the COVID-19 declaration under Section 564(b)(1) of the Act, 21 U.S.C. section 360bbb-3(b)(1), unless the authorization is terminated or revoked.  Performed at [facility], [place] [address] [place], [place] [address]     Radiology Studies: No results found.    Ajla Mcgeachy T. Tracy  If 7PM-7AM, please contact night-coverage [email or web address] 10/18/2021, 12:01 PM

## 2021-10-18 NOTE — Assessment & Plan Note (Addendum)
On 2.5 L by [place] at baseline at night--  -steroid neb -DuoNeb as needed

## 2021-10-18 NOTE — Hospital Course (Addendum)
66 year old M with PMH of CAD/remote stent on Plavix, chronic RF on 2 L, COPD, chronic pain on opiate, extensive DVT on Eliquis, DM-2, HTN, HLD and morbid obesity presenting with acute onset confusion and aphasia with concern for CVA.  Reportedly took 2 of the 200 mg trazodone and he is the pain medication prior to arrival.  UDS negative.  He tested positive for COVID-19 but symptomatic for over 2 weeks.   CT head concerning for 3 to 4 cm region of subacute infarction versus mass in left posterior temporal lobe.  Neurology consulted.  He was started on Keppra 500 mg twice daily. Spot and LTM EEG without seizure or epileptiform discharge.    CT angio head and neck and CT cerebral perfusion with contrast without LVO but showed 2.5 cm hyperemic mass in left posterior temporal lobe.   MRI brain without contrast also showed mass lesion of the left temporal lobe with associated edema but truncated exam due to patient's inability to tolerate setting of mental status change.  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    Neurosurgery consulted and started Decadron.  Plan for left temporal craniotomy and resection of tumor on 2/21.  Neurology, neurosurgery neurology and neurosurgery following.  Neurology and neurooncology, Dr. Mickeal Skinner recommended CT chest/abdomen/pelvis to exclude for possible primary elsewhere - appears cancelled due to allergy...will defer to Dr. Mickeal Skinner to order if needed  Mental status improved Plan for CIR when bed

## 2021-10-18 NOTE — Anesthesia Procedure Notes (Addendum)
Procedure Name: Intubation Date/Time: 10/18/2021 6:14 PM Performed by: Trinna Post., CRNA Pre-anesthesia Checklist: Patient identified, Emergency Drugs available, Suction available, Patient being monitored and Timeout performed Patient Re-evaluated:Patient Re-evaluated prior to induction Oxygen Delivery Method: Circle system utilized Preoxygenation: Pre-oxygenation with 100% oxygen Induction Type: IV induction and Rapid sequence Laryngoscope Size: Mac and 4 Grade View: Grade III Tube type: Oral Tube size: 7.5 mm Number of attempts: 1 Airway Equipment and Method: Stylet Placement Confirmation: ETT inserted through vocal cords under direct vision, positive ETCO2, CO2 detector and breath sounds checked- equal and bilateral Secured at: 22 cm Tube secured with: Tape Dental Injury: Teeth and Oropharynx as per pre-operative assessment

## 2021-10-18 NOTE — Assessment & Plan Note (Addendum)
Resolved

## 2021-10-18 NOTE — Progress Notes (Signed)
Please see progress note from yesterday for most recent findings and recommendations. Patient is undergoing MRI brain with and without contrast under GA this afternoon and neurology will f/u tomorrow with further recommendations.  Su Monks, MD [facility] [phone]  If 7pm- 7am, please page neurology on call as listed in [place].

## 2021-10-18 NOTE — Assessment & Plan Note (Addendum)
Acute onset.  Does not seem to have other focal neuro deficit but limited exam due to confusion.  -No seizure activity since admission.   -Spot and LTM EEG negative for seizure.  -UDS, EtOH and TSH negative.  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 -Continue Keppra 500 mg twice daily- will discuss with neurology in case keppra is worsening delirium/behavior -has not been getting seroquel at night so will d/c -IV Haldol schedule-- will d/c and try nightly seroquel- wean to off as recovers -Seizure, fall, delirium and aspiration precaution  -Minimize sedating medications -SLP/PT/OT eval- CIR consulted

## 2021-10-18 NOTE — Transfer of Care (Signed)
Immediate Anesthesia Transfer of Care Note  Patient: Jeff Samad Sr.  Procedure(s) Performed: MRI WITH ANESTHESIA  Patient Location: PACU  Anesthesia Type:General  Level of Consciousness: awake and confused  Airway & Oxygen Therapy: Patient Spontanous Breathing and Patient connected to face mask oxygen  Post-op Assessment: Report given to RN and Post -op Vital signs reviewed and stable  Post vital signs: Reviewed and stable  Last Vitals:  Vitals Value Taken Time  BP    Temp    Pulse 101 10/18/21 1937  Resp 19 10/18/21 1937  SpO2 93 % 10/18/21 1937  Vitals shown include unvalidated device data.  Last Pain:  Vitals:   10/18/21 1506  TempSrc: Oral  PainSc:          Complications: No notable events documented.

## 2021-10-18 NOTE — Assessment & Plan Note (Signed)
-   Atorvastatin as above.

## 2021-10-19 ENCOUNTER — Other Ambulatory Visit: Payer: Self-pay | Admitting: Neurosurgery

## 2021-10-19 ENCOUNTER — Other Ambulatory Visit: Payer: Self-pay | Admitting: Radiation Therapy

## 2021-10-19 ENCOUNTER — Encounter (HOSPITAL_COMMUNITY): Payer: Self-pay | Admitting: Radiology

## 2021-10-19 DIAGNOSIS — G9389 Other specified disorders of brain: Secondary | ICD-10-CM

## 2021-10-19 LAB — GLUCOSE, CAPILLARY
Glucose-Capillary: 204 mg/dL — ABNORMAL HIGH (ref 70–99)
Glucose-Capillary: 179 mg/dL — ABNORMAL HIGH (ref 70–99)
Glucose-Capillary: 254 mg/dL — ABNORMAL HIGH (ref 70–99)
Glucose-Capillary: 296 mg/dL — ABNORMAL HIGH (ref 70–99)
Glucose-Capillary: 295 mg/dL — ABNORMAL HIGH (ref 70–99)
Glucose-Capillary: 281 mg/dL — ABNORMAL HIGH (ref 70–99)
Glucose-Capillary: 317 mg/dL — ABNORMAL HIGH (ref 70–99)

## 2021-10-19 LAB — RENAL FUNCTION PANEL
Albumin: 3.1 g/dL — ABNORMAL LOW (ref 3.5–5.0)
Anion gap: 10 (ref 5–15)
BUN: 12 mg/dL (ref 8–23)
CO2: 26 mmol/L (ref 22–32)
Calcium: 8.8 mg/dL — ABNORMAL LOW (ref 8.9–10.3)
Chloride: 104 mmol/L (ref 98–111)
Creatinine, Ser: 0.91 mg/dL (ref 0.61–1.24)
GFR, Estimated: >60 mL/min (ref >60)
Glucose, Bld: 179 mg/dL — ABNORMAL HIGH (ref 70–99)
Phosphorus: 3.4 mg/dL (ref 2.5–4.6)
Potassium: 3.8 mmol/L (ref 3.5–5.1)
Sodium: 140 mmol/L (ref 135–145)

## 2021-10-19 LAB — AMMONIA: Ammonia: 29 umol/L (ref 9–35)

## 2021-10-19 LAB — CBC
HCT: 37.8 % — ABNORMAL LOW (ref 39.0–52.0)
Hemoglobin: 12.5 g/dL — ABNORMAL LOW (ref 13.0–17.0)
MCH: 32.1 pg (ref 26.0–34.0)
MCHC: 33.1 g/dL (ref 30.0–36.0)
MCV: 96.9 fL (ref 80.0–100.0)
Platelets: 148 10*3/uL — ABNORMAL LOW (ref 150–400)
RBC: 3.9 MIL/uL — ABNORMAL LOW (ref 4.22–5.81)
RDW: 13.6 % (ref 11.5–15.5)
WBC: 4 10*3/uL (ref 4.0–10.5)
nRBC: 0 % (ref 0.0–0.2)

## 2021-10-19 LAB — SURGICAL PCR SCREEN
MRSA, PCR: NEGATIVE
Staphylococcus aureus: NEGATIVE

## 2021-10-19 LAB — MAGNESIUM: Magnesium: 2 mg/dL (ref 1.7–2.4)

## 2021-10-19 LAB — CK: Total CK: 75 U/L (ref 49–397)

## 2021-10-19 MED ORDER — HALOPERIDOL LACTATE 5 MG/ML IJ SOLN
5.0000 mg | Freq: Four times a day (QID) | INTRAMUSCULAR | Status: DC | PRN
  Administered 2021-10-19: 5 mg via INTRAVENOUS
  Filled 2021-10-19: qty 1

## 2021-10-19 MED ORDER — LORAZEPAM 2 MG/ML IJ SOLN: 0.5000 mg | INTRAMUSCULAR | Status: DC | PRN

## 2021-10-19 MED ORDER — DEXAMETHASONE SODIUM PHOSPHATE 10 MG/ML IJ SOLN
10.0000 mg | Freq: Once | INTRAMUSCULAR | Status: AC
  Administered 2021-10-19: 10 mg via INTRAVENOUS
  Filled 2021-10-19: qty 1

## 2021-10-19 MED ORDER — LORAZEPAM 2 MG/ML IJ SOLN
0.5000 mg | INTRAMUSCULAR | Status: AC | PRN
  Administered 2021-10-19: 0.5 mg via INTRAVENOUS
  Filled 2021-10-19: qty 1

## 2021-10-19 MED ORDER — MUPIROCIN 2 % EX OINT
1.0000 "application " | TOPICAL_OINTMENT | Freq: Two times a day (BID) | CUTANEOUS | Status: AC
  Administered 2021-10-19 – 2021-10-24 (×10): 1 via NASAL
  Filled 2021-10-19 (×2): qty 22

## 2021-10-19 MED ORDER — INSULIN ASPART 100 UNIT/ML IJ SOLN
6.0000 [IU] | Freq: Three times a day (TID) | INTRAMUSCULAR | Status: DC
  Administered 2021-10-19 – 2021-10-20 (×3): 6 [IU] via SUBCUTANEOUS

## 2021-10-19 MED ORDER — DEXAMETHASONE SODIUM PHOSPHATE 4 MG/ML IJ SOLN
4.0000 mg | Freq: Four times a day (QID) | INTRAMUSCULAR | Status: DC
  Administered 2021-10-19 – 2021-10-21 (×9): 4 mg via INTRAVENOUS
  Filled 2021-10-19 (×9): qty 1

## 2021-10-19 MED ORDER — QUETIAPINE FUMARATE 25 MG PO TABS
25.0000 mg | ORAL_TABLET | Freq: Every morning | ORAL | Status: DC
  Administered 2021-10-19: 25 mg via ORAL
  Filled 2021-10-19: qty 1

## 2021-10-19 MED ORDER — INSULIN GLARGINE-YFGN 100 UNIT/ML ~~LOC~~ SOLN
15.0000 [IU] | Freq: Every day | SUBCUTANEOUS | Status: DC
  Administered 2021-10-19 – 2021-10-20 (×2): 15 [IU] via SUBCUTANEOUS
  Filled 2021-10-19 (×2): qty 0.15

## 2021-10-19 MED ORDER — HALOPERIDOL LACTATE 5 MG/ML IJ SOLN
2.0000 mg | Freq: Four times a day (QID) | INTRAMUSCULAR | Status: DC | PRN
  Administered 2021-10-19 – 2021-10-21 (×5): 2 mg via INTRAVENOUS
  Filled 2021-10-19 (×5): qty 1

## 2021-10-19 MED ORDER — INSULIN ASPART 100 UNIT/ML IJ SOLN
0.0000 [IU] | Freq: Every day | INTRAMUSCULAR | Status: DC
  Administered 2021-10-19: 22:00:00 4 [IU] via SUBCUTANEOUS
  Administered 2021-10-20: 3 [IU] via SUBCUTANEOUS

## 2021-10-19 MED ORDER — INSULIN ASPART 100 UNIT/ML IJ SOLN
0.0000 [IU] | Freq: Three times a day (TID) | INTRAMUSCULAR | Status: DC
  Administered 2021-10-19: 17:00:00 11 [IU] via SUBCUTANEOUS
  Administered 2021-10-20: 15 [IU] via SUBCUTANEOUS
  Administered 2021-10-20: 11 [IU] via SUBCUTANEOUS
  Administered 2021-10-21: 7 [IU] via SUBCUTANEOUS
  Administered 2021-10-21: 11 [IU] via SUBCUTANEOUS
  Administered 2021-10-21: 7 [IU] via SUBCUTANEOUS

## 2021-10-19 MED ORDER — CHLORHEXIDINE GLUCONATE CLOTH 2 % EX PADS
6.0000 | MEDICATED_PAD | Freq: Once | CUTANEOUS | Status: AC
Start: 2021-10-19 — End: 2021-10-20
  Administered 2021-10-19 – 2021-10-20 (×2): 6 via TOPICAL

## 2021-10-19 MED ORDER — QUETIAPINE FUMARATE 25 MG PO TABS
50.0000 mg | ORAL_TABLET | Freq: Every day | ORAL | Status: DC
  Administered 2021-10-19: 50 mg via ORAL
  Filled 2021-10-19: qty 1

## 2021-10-19 MED ORDER — CHLORHEXIDINE GLUCONATE CLOTH 2 % EX PADS
6.0000 | MEDICATED_PAD | Freq: Once | CUTANEOUS | Status: AC
Start: 2021-10-19 — End: 2021-10-19
  Administered 2021-10-19: 6 via TOPICAL

## 2021-10-19 NOTE — Progress Notes (Addendum)
Patient and family extremely agitated. Patient continuously asked to go to the bathroom with very vulgar language. Family removed EEG leads from EEG and took him to bathroom. Family asked repeatedly for EEG to be removed. This RN contacted EEG and let the family know they will be on their way once they have orders. Patient had a BM and peed while ambulated. Nurse Director and Nurse tech was present along side this Therapist, sports.  Shelbie Proctor, RN

## 2021-10-19 NOTE — Anesthesia Postprocedure Evaluation (Signed)
Anesthesia Post Note  Patient: Jeff Shostak Sr.  Procedure(s) Performed: MRI WITH ANESTHESIA     Patient location during evaluation: PACU Anesthesia Type: General Level of consciousness: awake and alert Pain management: pain level controlled Vital Signs Assessment: post-procedure vital signs reviewed and stable Respiratory status: spontaneous breathing, nonlabored ventilation, respiratory function stable and patient connected to nasal cannula oxygen Cardiovascular status: blood pressure returned to baseline and stable Postop Assessment: no apparent nausea or vomiting Anesthetic complications: no   No notable events documented.  Last Vitals:  Vitals:   10/18/21 2018 10/18/21 2306  BP: (!) 160/90 (!) 143/87  Pulse: 93 98  Resp: 18 16  Temp: 37.3 C 37.4 C  SpO2:      Last Pain:  Vitals:   10/18/21 2306  TempSrc: Axillary  PainSc:                  March Rummage Sergey Ishler

## 2021-10-19 NOTE — Progress Notes (Signed)
Inpatient Diabetes Program Recommendations  AACE/ADA: New Consensus Statement on Inpatient Glycemic Control   Target Ranges:  Prepandial:   less than 140 mg/dL      Peak postprandial:   less than 180 mg/dL (1-2 hours)      Critically ill patients:  140 - 180 mg/dL    Latest Reference Range & Units 10/18/21 07:52 10/18/21 12:16 10/18/21 15:04 10/18/21 20:33 10/18/21 23:08 10/19/21 07:37  Glucose-Capillary 70 - 99 mg/dL 383 (H) 255 (H) 246 (H) 213 (H) 235 (H) 179 (H)   Review of Glycemic Control  Diabetes history: DM2 Outpatient Diabetes medications: Glipizide XL 5 mg BID Current orders for Inpatient glycemic control: Novolog 0-20 units TID with meals, Novolog 0-5 units QHS; Decadron 4 mg Q6H  Inpatient Diabetes Program Recommendations:    Insulin: If steroids are continued, please consider ordering Semglee 10 units Q24H.  Thanks, Barnie Alderman, RN, MSN, CDE Diabetes Coordinator Inpatient Diabetes Program [phone] (Team Pager from 8am to 5pm)

## 2021-10-19 NOTE — Evaluation (Signed)
Occupational Therapy Evaluation Patient Details Name: Jeff Ravenscroft Sr. MRN: [id number] DOB: 1955/11/04 Today's Date: 10/19/2021   History of Present Illness 66 year old male with acute onset of aphasia and confusion. 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 Plan is for tumor resection. Patient's medical history includes type 2 diabetes mellitus, coronary artery disease and a remote history of DVT still being treated on Eliquis and Plavix   Clinical Impression   Eval limited due to lethargy and level of arousal. Family provided PLOF. PTA pt was independent with mobility and ADL tasks. Wife helped with medication management due to prior memory problems, however pt was driving. Pt agitated earlier and behavior not consistent with baseline. Wife/daughter removed restraints and disconnected EEG and walked pt to the bathroom, where he fell asleep on the toilet, however they were able to walk him back to the bed. Will plan to follow up after tumor resection to further assess DC needs. Family states they are able to provide support at DC, however wife also uses a RW.      Recommendations for follow up therapy are one component of a multi-disciplinary discharge planning process, led by the attending physician.  Recommendations may be updated based on patient status, additional functional criteria and insurance authorization.   Follow Up Recommendations  Other (comment) (will further assess after surgery)    Assistance Recommended at Discharge Frequent or constant Supervision/Assistance  Patient can return home with the following      Functional Status Assessment  Patient has had a recent decline in their functional status and demonstrates the ability to make significant improvements in function in a reasonable and predictable amount of time.  Equipment Recommendations  Other (comment)  (RW)    Recommendations for Other Services       Precautions / Restrictions Precautions Precautions: Fall      Mobility Bed Mobility Overal bed mobility: Needs Assistance                  Transfers Overall transfer level: Needs assistance                 General transfer comment: FAmily states he was very weak      Balance                                           ADL either performed or assessed with clinical judgement   ADL Overall ADL's : Needs assistance/impaired                                       General ADL Comments: Family assisting with ADL tasks at this time due to agitation adn confuion. Family disconnecteed pt form EEG, removed restraints adn walked him to the bathroom. Wife states he fell asleep on the toilet, which he oftne does at home. Will further assess     Vision Baseline Vision/History: 1 Wears glasses (readers) Vision Assessment?:  (wears readers) Additional Comments: will further assess     Perception     Praxis      Pertinent Vitals/Pain Pain Assessment Pain Assessment: Faces Pain Location: with taking BP Pain Descriptors / Indicators: Discomfort, Grimacing Pain Intervention(s): Limited activity within patient's tolerance  Hand Dominance Right   Extremity/Trunk Assessment Upper Extremity Assessment Upper Extremity Assessment: Generalized weakness (Hxof B frozen shoulders but could use arms functionally) ? RUE sensory motor difficulty with limited movement   Lower Extremity Assessment Lower Extremity Assessment: Defer to PT evaluation (neuropathy)       Communication Communication Communication: Expressive difficulties   Cognition Arousal/Alertness: Lethargic Behavior During Therapy: Agitated Wife states "He's not a mean person, he's not usually like this"; wife asking questions about survival rate after tumor resection                                          General Comments       Exercises     Shoulder Instructions      Home Living Family/patient expects to be discharged to:: Private residence Living Arrangements: Spouse/significant other Available Help at Discharge: Family Type of Home: House Home Access: Stairs to enter Technical brewer of Steps: 2 Entrance Stairs-Rails: None Home Layout: One level     Bathroom Shower/Tub: Teacher, early years/pre: Handicapped height Bathroom Accessibility: No   Home Equipment: Shower seat;Grab bars - toilet;Grab bars - tub/shower;Cane - single point          Prior Functioning/Environment Prior Level of Function : Independent/Modified Independent;Needs assist  Cognitive Assist : ADLs (cognitive)   ADLs (Cognitive): Intermittent cues         ADLs Comments: wife assists wtih medicaiton management; wears 2.5 L at home when sleeping/napping; has support dog down stairs; enjoys doing christain word search        OT Problem List: Decreased strength;Decreased activity tolerance;Impaired balance (sitting and/or standing);Decreased coordination;Decreased cognition;Decreased safety awareness;Decreased knowledge of use of DME or AE;Obesity;Pain      OT Treatment/Interventions: Self-care/ADL training;Therapeutic exercise;Neuromuscular education;DME and/or AE instruction;Therapeutic activities;Cognitive remediation/compensation;Visual/perceptual remediation/compensation;Patient/family education;Balance training    OT Goals(Current goals can be found in the care plan section) Acute Rehab OT Goals Patient Stated Goal: to return home OT Goal Formulation: With family Time For Goal Achievement: 11/02/21 Potential to Achieve Goals: Fair  OT Frequency: Min 2X/week    Co-evaluation PT/OT/SLP Co-Evaluation/Treatment: Yes Reason for Co-Treatment: Necessary to address cognition/behavior during functional activity;For patient/therapist safety   OT goals addressed during session:  ADL's and self-care      AM-PAC OT "6 Clicks" Daily Activity     Outcome Measure Help from another person eating meals?: A Little Help from another person taking care of personal grooming?: A Lot Help from another person toileting, which includes using toliet, bedpan, or urinal?: A Lot Help from another person bathing (including washing, rinsing, drying)?: A Lot Help from another person to put on and taking off regular upper body clothing?: A Lot Help from another person to put on and taking off regular lower body clothing?: A Lot 6 Click Score: 13   End of Session Nurse Communication: Mobility status  Activity Tolerance: Patient limited by lethargy;Patient limited by fatigue Patient left: in bed;with call bell/phone within reach;with bed alarm set;with family/visitor present  OT Visit Diagnosis: Unsteadiness on feet (R26.81);Other abnormalities of gait and mobility (R26.89);Other symptoms and signs involving cognitive function;Muscle weakness (generalized) (M62.81);Pain Pain - part of body:  (generalized)                Time: 1696-7893 OT Time Calculation (min): 26 min Charges:  OT General Charges $OT Visit: 1 Visit OT Evaluation $OT Eval Moderate  Complexity: [place], OT/L   Acute OT Clinical Specialist Acute Rehabilitation Services Pager [phone] Office [phone]   [facility] 10/19/2021, 1:43 PM

## 2021-10-19 NOTE — Progress Notes (Addendum)
PROGRESS NOTE  Jeff Luckey Sr. EHM:[id number] DOB: Feb 18, 1956   PCP: Pcp, No  Patient is from: Lives with wife.  Independently ambulates at baseline.  DOA: 10/15/2021 LOS: 3  Chief complaints:  Chief Complaint  Patient presents with   Code Stroke     Brief Narrative / Interim history: 66 year old M with PMH of CAD/remote stent on Plavix, chronic RF on 2 L, COPD, chronic pain on opiate, extensive DVT on Eliquis, DM-2, HTN, HLD and morbid obesity presenting with acute onset confusion and aphasia with concern for CVA.  Reportedly took 2 of the 200 mg trazodone and he is the pain medication prior to arrival.  UDS negative.  He tested positive for COVID-19 but symptomatic for over 2 weeks.   CT head concerning for 3 to 4 cm region of subacute infarction versus mass in left posterior temporal lobe.  Neurology consulted.  He was started on Keppra 500 mg twice daily. Spot and LTM EEG without seizure or epileptiform discharge.    CT angio head and neck and CT cerebral perfusion with contrast without LVO but showed 2.5 cm hyperemic mass in left posterior temporal lobe.   MRI brain without contrast also showed mass lesion of the left temporal lobe with associated edema but truncated exam due to patient's inability to tolerate setting of mental status change.  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    Neurosurgery consulted and started Decadron.  Plan for left temporal craniotomy and resection of tumor on 2/21.  Neurology, neurosurgery neurology and neurosurgery following.  Neurology and neurooncology, Dr. Mickeal Skinner recommended CT chest/abdomen/pelvis to exclude for possible primary elsewhere.  Patient has significant confusion and agitation requiring chemical and physical restraints after MRI brain under general  anesthesia.   Subjective: Seen and examined earlier this morning.  Patient was very agitated and combative overnight requiring chemical and physical restraints.  He continues to be agitated this morning.  Still with restraints.  Patient's wife at bedside.  Very concerned about him.  We have discussed antipsychotics such as p.o. Seroquel and IV Haldol as needed for agitation and combativeness.   Objective: Vitals:   10/19/21 0310 10/19/21 0316 10/19/21 0741 10/19/21 1453  BP:  140/75 (!) 171/91 125/89  Pulse: 98  82 80  Resp: 16  17 17   Temp:   98.5 F (36.9 C) 99.1 F (37.3 C)  TempSrc:   Oral Oral  SpO2: 96%  97% 90%  Weight:      Height:        Examination:  GENERAL: No apparent distress.  Nontoxic. HEENT: MMM.  Vision and hearing grossly intact.  NECK: Supple.  Difficult to assess JVD due to body habitus. RESP: 98% on RA.  No IWOB.  Fair aeration bilaterally. CVS:  RRR. Heart sounds normal.  ABD/GI/GU: BS+. Abd soft, NTND.  Abdominal restraints. MSK/EXT:  Moves extremities.  Bilateral wrist restraints. SKIN: no apparent skin lesion or wound NEURO: Awake and alert.  Oriented to self only.  Aphasia but seems to have improved.  No apparent focal neuro deficit but limited exam as patient does not follow commands.. PSYCH: Very agitated.  Procedures:  None  Microbiology summarized: COVID-19 PCR positive.  Assessment and Plan: * Acute toxic metabolic encephalopathy with aphasia with new left temporal mass- (present on admission) Acute onset.  Does not seem to  have other focal neuro deficit but limited exam due to confusion.  He is only oriented to himself.  No seizure activity since admission.  Spot and LTM EEG negative for seizure. UDS, EtOH and TSH negative.  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 -Continue Keppra 500 mg twice daily -Started p.o. 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 -Neurology and neurosurgery recommends CT chest/abdomen/pelvis.  However, patient very agitated requiring chemical and physical restraint at the moment  Sinus bradycardia- (present on admission) Likely due to beta-blocker.  He was on metoprolol 100 mg twice daily at home -Beta-blocker as above. -Continue telemetry monitoring  Lab test positive for detection of COVID-19 virus- (present on admission) Previous attending discussed with lab (CT value 38.1).  Per wife, patient has received initial 2 doses of COVID-vaccine and 1 booster shot.  He started having cough and congestion 2 weeks ago.  Chronically uses 2.5 L supplemental oxygen and no change in oxygen requirement from baseline.  CXR without acute finding.  Inflammatory markers within normal. -Continue airborne precaution per hospital policy  Diabetes mellitus type 2, noninsulin dependent ([place]) Uncontrolled NIDDM-2 with hyperglycemia and other complications: J8A 4.1%.  Seems she is only on glipizide at home. Recent Labs  Lab 10/18/21 2033 10/18/21 2308 10/19/21 0737 10/19/21 1150 10/19/21 1451  GLUCAP 213* 235* 179* 254* 296*  -Continue SSI-steroid scale. -Start basal insulin 15 units daily -Start NovoLog 6 units 3 times daily with meals if he eats> 50% of his meals -Continue home Lipitor -He would benefit from GLP-1 agonist given morbid obesity and cardiac history.   CAD S/P percutaneous coronary angioplasty No anginal symptoms. -Continue IV metoprolol 5 mg every 6 hours since he will be n.p.o. after  midnight -Continue holding Hyzaar. -Continue holding torsemide.  Per wife, usually takes furosemide due to dehydration with torsemide. -Hold Plavix and Eliquis pending surgical intervention for brain tumor  Essential hypertension Stable. -Cardiac meds as above.  History of DVT (deep vein thrombosis) -Hold Eliquis  Hypokalemia Resolved.  COPD with chronic hypoxic respiratory failure On 2.5 L by [place] at baseline.  Stable. -Continue home Trelegy Ellipta -DuoNeb as needed  Hyperlipidemia - Atorvastatin as above.  Morbid obesity ([place]) Meets criteria with his BMI and history of diabetes Body mass index is 37.86 kg/m. -Encourage lifestyle change to lose weight -Could consider GLP-1 agonist instead of glipizide to help with weight loss       DVT prophylaxis:  Place and maintain sequential compression device Start: 10/17/21 1655  Code Status: Full code Family Communication: Updated patient's wife at bedside. Level of care: Telemetry Medical Status is: Inpatient Remains inpatient appropriate because: Due to acute encephalopathy/agitation and further evaluation and management of brain tumor    Final disposition: TBD     Consultants:  Neurology Neurosurgery Neuro oncology over the phone   Sch Meds:  Scheduled Meds:  atorvastatin  40 mg Oral Daily   dexamethasone (DECADRON) injection  4 mg Intravenous Q6H   fluticasone furoate-vilanterol  1 puff Inhalation Daily   And   umeclidinium bromide  1 puff Inhalation Daily  insulin aspart  0-20 Units Subcutaneous TID WC   insulin aspart  0-5 Units Subcutaneous QHS   insulin aspart  6 Units Subcutaneous TID WC   insulin glargine-yfgn  15 Units Subcutaneous Daily   isosorbide mononitrate  30 mg Oral Daily   metoprolol tartrate  5 mg Intravenous Q6H   montelukast  10 mg Oral QPM   pantoprazole  40 mg Oral Daily   QUEtiapine  25 mg Oral q AM   And   QUEtiapine  50 mg Oral QHS   Continuous Infusions:  levETIRAcetam 500  mg (10/19/21 0927)   PRN Meds:.acetaminophen **OR** acetaminophen, haloperidol lactate, ipratropium-albuterol, nitroGLYCERIN, oxyCODONE-acetaminophen, traZODone  Antimicrobials: Anti-infectives (From admission, onward)    None        I have personally reviewed the following labs and images: CBC: Recent Labs  Lab 10/15/21 2138 10/15/21 2148 10/17/21 1104 10/18/21 0320 10/19/21 0730  WBC 4.7  --  5.1 4.9 4.0  NEUTROABS 2.6  --   --   --   --   HGB 14.0 13.9 13.4 13.0 12.5*  HCT 43.4 41.0 39.4 37.7* 37.8*  MCV 99.5  --  95.6 95.0 96.9  PLT 166  --  154 160 148*   BMP &GFR Recent Labs  Lab 10/15/21 2138 10/15/21 2148 10/17/21 1104 10/18/21 0320 10/19/21 0730  NA 137 138 136 138 140  K 4.0 4.0 3.7 3.3* 3.8  CL 99 99 100 99 104  CO2 28  --  24 26 26   GLUCOSE 230* 226* 186* 266* 179*  BUN 15 17 12 17 12   CREATININE 0.91 0.90 0.85 1.06 0.91  CALCIUM 9.1  --  9.0 8.8* 8.8*  MG  --   --   --  1.9 2.0  PHOS  --   --   --   --  3.4   Estimated Creatinine Clearance: 102.2 mL/min (by C-G formula based on SCr of 0.91 mg/dL). Liver & Pancreas: Recent Labs  Lab 10/15/21 2138 10/17/21 1104 10/19/21 0730  AST 44* 112*  --   ALT 42 58*  --   ALKPHOS 48 48  --   BILITOT 0.4 0.6  --   PROT 7.2 6.7  --   ALBUMIN 3.6 3.5 3.1*   No results for input(s): LIPASE, AMYLASE in the last 168 hours. Recent Labs  Lab 10/19/21 0730  AMMONIA 29   Diabetic: No results for input(s): HGBA1C in the last 72 hours.  Recent Labs  Lab 10/18/21 2033 10/18/21 2308 10/19/21 0737 10/19/21 1150 10/19/21 1451  GLUCAP 213* 235* 179* 254* 296*   Cardiac Enzymes: Recent Labs  Lab 10/19/21 0730  CKTOTAL 75   No results for input(s): PROBNP in the last 8760 hours. Coagulation Profile: Recent Labs  Lab 10/15/21 2138  INR 1.2   Thyroid Function Tests: No results for input(s): TSH, T4TOTAL, FREET4, T3FREE, THYROIDAB in the last 72 hours.  Lipid Profile: No results for input(s):  CHOL, HDL, LDLCALC, TRIG, CHOLHDL, LDLDIRECT in the last 72 hours. Anemia Panel: No results for input(s): VITAMINB12, FOLATE, FERRITIN, TIBC, IRON, RETICCTPCT in the last 72 hours.  Urine analysis:    Component Value Date/Time   COLORURINE STRAW (A) 10/15/2021 2311   APPEARANCEUR CLEAR 10/15/2021 2311   LABSPEC 1.010 10/15/2021 2311   PHURINE 6.5 10/15/2021 2311   GLUCOSEU >=500 (A) 10/15/2021 2311   HGBUR TRACE (A) 10/15/2021 2311   BILIRUBINUR NEGATIVE 10/15/2021 [place] 10/15/2021 2311   PROTEINUR NEGATIVE 10/15/2021 2311  NITRITE NEGATIVE 10/15/2021 2311   LEUKOCYTESUR NEGATIVE 10/15/2021 2311   Sepsis Labs: Invalid input(s): PROCALCITONIN, [place]  Microbiology: Recent Results (from the past 240 hour(s))  Resp Panel by RT-PCR (Flu A&B, Covid) Nasopharyngeal Swab     Status: Abnormal   Collection Time: 10/15/21 11:52 PM   Specimen: Nasopharyngeal Swab; Nasopharyngeal(NP) swabs in vial transport medium  Result Value Ref Range Status   SARS Coronavirus 2 by RT PCR POSITIVE (A) NEGATIVE Final    Comment: (NOTE) SARS-CoV-2 target nucleic acids are DETECTED.  The SARS-CoV-2 RNA is generally detectable in upper respiratory specimens during the acute phase of infection. Positive results are indicative of the presence of the identified virus, but do not rule out bacterial infection or co-infection with other pathogens not detected by the test. Clinical correlation with patient history and other diagnostic information is necessary to determine patient infection status. The expected result is Negative.  Fact Sheet for Patients: EntrepreneurPulse.com.au  Fact Sheet for Healthcare Providers: IncredibleEmployment.be  This test is not yet approved or cleared by the [place] FDA and  has been authorized for detection and/or diagnosis of SARS-CoV-2 by FDA under an Emergency Use Authorization (EUA).  This EUA  will remain in effect (meaning this test can be used) for the duration of  the COVID-19 declaration under Section 564(b)(1) of the A ct, 21 U.S.C. section 360bbb-3(b)(1), unless the authorization is terminated or revoked sooner.     Influenza A by PCR NEGATIVE NEGATIVE Final   Influenza B by PCR NEGATIVE NEGATIVE Final    Comment: (NOTE) The Xpert Xpress SARS-CoV-2/FLU/RSV plus assay is intended as an aid in the diagnosis of influenza from Nasopharyngeal swab specimens and should not be used as a sole basis for treatment. Nasal washings and aspirates are unacceptable for Xpert Xpress SARS-CoV-2/FLU/RSV testing.  Fact Sheet for Patients: EntrepreneurPulse.com.au  Fact Sheet for Healthcare Providers: IncredibleEmployment.be  This test is not yet approved or cleared by the [place] FDA and has been authorized for detection and/or diagnosis of SARS-CoV-2 by FDA under an Emergency Use Authorization (EUA). This EUA will remain in effect (meaning this test can be used) for the duration of the COVID-19 declaration under Section 564(b)(1) of the Act, 21 U.S.C. section 360bbb-3(b)(1), unless the authorization is terminated or revoked.  Performed at [facility], [place] [address] [place], [place] [address]     Radiology Studies: MR BRAIN W WO CONTRAST  Result Date: 10/18/2021 CLINICAL DATA:  Initial evaluation for brain/CNS neoplasm, staging. EXAM: MRI HEAD WITHOUT AND WITH CONTRAST TECHNIQUE: Multiplanar, multiecho pulse sequences of the brain and surrounding structures were obtained without and with intravenous contrast. CONTRAST:  37mL GADAVIST GADOBUTROL 1 MMOL/ML IV SOLN COMPARISON:  Prior CTs from 10/15/2021. FINDINGS: Brain: Cerebral volume within normal limits. Mild scattered patchy T2/FLAIR hyperintensity noted involving the periventricular and deep white matter both cerebral hemispheres, nonspecific, but most like related chronic  microvascular ischemic disease, overall mild in nature in felt to be within normal limits for age. No evidence for acute or subacute infarct. No areas of chronic cortical infarction. No acute intracranial hemorrhage. Rounded mass positioned at the left temporal lobe measures 2.4 x 2.7 x 2.8 cm (AP by transverse by craniocaudad). Lesion demonstrates hyperintense T2 signal intensity with avid heterogeneous and predominantly peripheral postcontrast enhancement. Speckled areas of internal susceptibility artifact consistent with necrosis and/or blood products. Surrounding T2/FLAIR signal intensity consistent with vasogenic edema and/or infiltrating tumor. Mild mass effect on the atrium of the left lateral ventricle  without significant midline shift. 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 No other mass lesion or abnormal enhancement. No hydrocephalus or ventricular trapping. No extra-axial fluid collection. Pituitary gland suprasellar region normal. Midline structures normal. Vascular: Major intracranial vascular flow voids are maintained. Skull and upper cervical spine: Craniocervical junction within normal limits. Bone marrow signal intensity within normal limits. No focal marrow replacing lesion. No scalp soft tissue abnormality. Sinuses/Orbits: Globes and orbital soft tissues demonstrate no acute finding. Scattered mucosal thickening noted within the ethmoidal air cells. Fluid seen layering within the nasopharynx. Small bilateral mastoid effusions. Patient is intubated. Other: None. IMPRESSION: 1. 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 Surrounding vasogenic edema and/or infiltrating tumor without significant midline shift. 2. No other acute intracranial abnormality. Electronically Signed   By: Jeannine Boga M.D.   On: 10/18/2021 19:49   Overnight EEG with video  Result Date:  10/19/2021 Lora Havens, MD     10/19/2021  3:26 PM Patient Name: Jeff Payment Sr. MRN: [id number] Epilepsy Attending: Lora Havens Referring Physician/Provider: Dr Su Monks Duration: 10/18/2021 2129 to 10/19/2021 1154 Patient history: 66 year old male with altered mental status and left temporal mass.  EEG to evaluate for seizure Level of alertness: Awake, asleep AEDs during EEG study: LEV Technical aspects: This EEG study was done with scalp electrodes positioned according to the 10-20 International system of electrode placement. Electrical activity was acquired at a sampling rate of 500Hz  and reviewed with a high frequency filter of 70Hz  and a low frequency filter of 1Hz . EEG data were recorded continuously and digitally stored. Description: The posterior dominant rhythm consists of 9 Hz activity of moderate voltage (25-35 uV) seen predominantly in posterior head regions, symmetric and reactive to eye opening and eye closing. Sleep was characterized by vertex waves, sleep spindles (12 to 14 Hz), maximal frontocentral region.  EEG showed intermittent 3 to 5 Hz theta-delta slowing in left temporal region which at times appears rhythmic without definite evolution consistent with lateralized rhythmic delta activity.  Intermittent generalized 3 to 5 Hz theta and delta slowing was also noted. Hyperventilation and photic stimulation were not performed.   ABNORMALITY -Lateralized rhythmic delta activity, left temporal region -Intermittent slow, generalized IMPRESSION: This study is suggestive of cortical dysfunction in left temporal region which is likely secondary to underlying mass.  Of note, lateralized rhythmic activity is on the ictal-interictal continuum with low potential for seizures.  Additionally there is mild to moderate diffuse encephalopathy, nonspecific etiology.  No definite seizures or epileptiform discharges were seen throughout the recording. Priyanka Barbra Sarks      Marquiz Sotelo T. [place]  If 7PM-7AM, please contact night-coverage [email or web address] 10/19/2021, 3:51 PM

## 2021-10-19 NOTE — Procedures (Addendum)
Patient Name: Jeff Najjar Sr.  MRN: [id number]  Epilepsy Attending: Lora Havens  Referring Physician/Provider: Dr Su Monks Duration: 10/18/2021 2129 to 10/19/2021 1154  Patient history: 66 year old male with altered mental status and left temporal mass.  EEG to evaluate for seizure  Level of alertness: Awake, asleep  AEDs during EEG study: LEV  Technical aspects: This EEG study was done with scalp electrodes positioned according to the 10-20 International system of electrode placement. Electrical activity was acquired at a sampling rate of 500Hz  and reviewed with a high frequency filter of 70Hz  and a low frequency filter of 1Hz . EEG data were recorded continuously and digitally stored.   Description: The posterior dominant rhythm consists of 9 Hz activity of moderate voltage (25-35 uV) seen predominantly in posterior head regions, symmetric and reactive to eye opening and eye closing. Sleep was characterized by vertex waves, sleep spindles (12 to 14 Hz), maximal frontocentral region.  EEG showed intermittent 3 to 5 Hz theta-delta slowing in left temporal region which at times appears rhythmic without definite evolution consistent with lateralized rhythmic delta activity.  Intermittent generalized 3 to 5 Hz theta and delta slowing was also noted. Hyperventilation and photic stimulation were not performed.     ABNORMALITY -Lateralized rhythmic delta activity, left temporal region -Intermittent slow, generalized  IMPRESSION: This study is suggestive of cortical dysfunction in left temporal region which is likely secondary to underlying mass.  Of note, lateralized rhythmic activity is on the ictal-interictal continuum with low potential for seizures.  Additionally there is mild to moderate diffuse encephalopathy, nonspecific etiology.  No definite seizures or epileptiform discharges were seen throughout the recording.  Lydie Stammen Barbra Sarks

## 2021-10-19 NOTE — Progress Notes (Signed)
Speech Language Pathology Treatment:    Patient Details Name: Jeff Korver Sr. MRN: [id number] DOB: October 24, 1955 Today's Date: 10/19/2021 Time:  -     Pt passed the RN swallow screen and SLP assessment is not indicated. Should pt or staff have concerns for swallow safety, please reconsult Speech Pathology.                  Houston Siren  10/19/2021, 11:39 AM

## 2021-10-19 NOTE — Consult Note (Signed)
Reason for Consult: Brain tumor Referring Physician: Medicine  Deborha Payment Sr. is an 66 y.o. male.  HPI: 66 year old male with fairly cute onset of aphasia approximately 3 days ago.  Patient's medical history includes type 2 diabetes mellitus, coronary artery disease and a remote history of DVT still being treated on Eliquis and Plavix.  Patient was admitted to the hospital for observation and evaluation.  CT scan demonstrated evidence of a left temporal lesion worrisome for infarct or mass.  An MRI scan has finally been obtained after the patient was sedated.  The patient's MRI scan demonstrates evidence of a enhancing mass in his left posterior temporal region worrisome for primary neoplasm (most likely glioblastoma).  The patient remains agitated.  He complains of some headache.  He continues to have difficulty with word finding and confusion.  Past Medical History:  Diagnosis Date   Morbid obesity ([place]) 10/17/2021    Past Surgical History:  Procedure Laterality Date   RADIOLOGY WITH ANESTHESIA N/A 10/18/2021   Procedure: MRI WITH ANESTHESIA;  Surgeon: Radiologist, Medication, MD;  Location: [facility];  Service: Radiology;  Laterality: N/A;    History reviewed. No pertinent family history.  Social History:  has no history on file for tobacco use, alcohol use, and drug use.  Allergies:  Allergies  Allergen Reactions   Iodine Anaphylaxis   Shellfish Allergy Anaphylaxis    Medications: I have reviewed the patient's current medications.  Results for orders placed or performed during the hospital encounter of 10/15/21 (from the past 48 hour(s))  CBC     Status: Abnormal   Collection Time: 10/17/21 11:04 AM  Result Value Ref Range   WBC 5.1 4.0 - 10.5 K/uL   RBC 4.12 (L) 4.22 - 5.81 MIL/uL   Hemoglobin 13.4 13.0 - 17.0 g/dL   HCT 39.4 39.0 - 52.0 %   MCV 95.6 80.0 - 100.0 fL   MCH 32.5 26.0 - 34.0 pg   MCHC 34.0 30.0 - 36.0 g/dL   RDW 13.4 11.5 - 15.5 %   Platelets 154 150 - 400  K/uL   nRBC 0.0 0.0 - 0.2 %    Comment: Performed at [facility], [place] [address] [place], [place] [address]  Comprehensive metabolic panel     Status: Abnormal   Collection Time: 10/17/21 11:04 AM  Result Value Ref Range   Sodium 136 135 - 145 mmol/L   Potassium 3.7 3.5 - 5.1 mmol/L   Chloride 100 98 - 111 mmol/L   CO2 24 22 - 32 mmol/L   Glucose, Bld 186 (H) 70 - 99 mg/dL    Comment: Glucose reference range applies only to samples taken after fasting for at least 8 hours.   BUN 12 8 - 23 mg/dL   Creatinine, Ser 0.85 0.61 - 1.24 mg/dL   Calcium 9.0 8.9 - 10.3 mg/dL   Total Protein 6.7 6.5 - 8.1 g/dL   Albumin 3.5 3.5 - 5.0 g/dL   AST 112 (H) 15 - 41 U/L   ALT 58 (H) 0 - 44 U/L   Alkaline Phosphatase 48 38 - 126 U/L   Total Bilirubin 0.6 0.3 - 1.2 mg/dL   GFR, Estimated >60 >60 mL/min    Comment: (NOTE) Calculated using the CKD-EPI Creatinine Equation (2021)    Anion gap 12 5 - 15    Comment: Performed at [facility] [address], [place], [place] [address]  Glucose, capillary     Status: Abnormal  Collection Time: 10/17/21 11:54 AM  Result Value Ref Range   Glucose-Capillary 184 (H) 70 - 99 mg/dL    Comment: Glucose reference range applies only to samples taken after fasting for at least 8 hours.  Glucose, capillary     Status: Abnormal   Collection Time: 10/17/21  3:53 PM  Result Value Ref Range   Glucose-Capillary 343 (H) 70 - 99 mg/dL    Comment: Glucose reference range applies only to samples taken after fasting for at least 8 hours.  Glucose, capillary     Status: Abnormal   Collection Time: 10/17/21  7:16 PM  Result Value Ref Range   Glucose-Capillary 314 (H) 70 - 99 mg/dL    Comment: Glucose reference range applies only to samples taken after fasting for at least 8 hours.   Comment 1 Notify RN   Glucose, capillary     Status: Abnormal   Collection Time: 10/17/21 11:02 PM  Result Value Ref Range   Glucose-Capillary 296 (H) 70 - 99 mg/dL     Comment: Glucose reference range applies only to samples taken after fasting for at least 8 hours.  Basic metabolic panel     Status: Abnormal   Collection Time: 10/18/21  3:20 AM  Result Value Ref Range   Sodium 138 135 - 145 mmol/L   Potassium 3.3 (L) 3.5 - 5.1 mmol/L   Chloride 99 98 - 111 mmol/L   CO2 26 22 - 32 mmol/L   Glucose, Bld 266 (H) 70 - 99 mg/dL    Comment: Glucose reference range applies only to samples taken after fasting for at least 8 hours.   BUN 17 8 - 23 mg/dL   Creatinine, Ser 1.06 0.61 - 1.24 mg/dL   Calcium 8.8 (L) 8.9 - 10.3 mg/dL   GFR, Estimated >60 >60 mL/min    Comment: (NOTE) Calculated using the CKD-EPI Creatinine Equation (2021)    Anion gap 13 5 - 15    Comment: Performed at [facility] [address], [place], [place] [address]  CBC     Status: Abnormal   Collection Time: 10/18/21  3:20 AM  Result Value Ref Range   WBC 4.9 4.0 - 10.5 K/uL   RBC 3.97 (L) 4.22 - 5.81 MIL/uL   Hemoglobin 13.0 13.0 - 17.0 g/dL   HCT 37.7 (L) 39.0 - 52.0 %   MCV 95.0 80.0 - 100.0 fL   MCH 32.7 26.0 - 34.0 pg   MCHC 34.5 30.0 - 36.0 g/dL   RDW 13.6 11.5 - 15.5 %   Platelets 160 150 - 400 K/uL   nRBC 0.0 0.0 - 0.2 %    Comment: Performed at [facility], [place] [address] [place], [place] [address]  Magnesium     Status: None   Collection Time: 10/18/21  3:20 AM  Result Value Ref Range   Magnesium 1.9 1.7 - 2.4 mg/dL    Comment: Performed at [facility] [address] [place], [place] [address]  Glucose, capillary     Status: Abnormal   Collection Time: 10/18/21  3:45 AM  Result Value Ref Range   Glucose-Capillary 291 (H) 70 - 99 mg/dL    Comment: Glucose reference range applies only to samples taken after fasting for at least 8 hours.  Glucose, capillary     Status: Abnormal   Collection Time: 10/18/21  7:52 AM  Result Value Ref Range   Glucose-Capillary 383 (H) 70 - 99 mg/dL  Comment: Glucose reference range applies only to samples  taken after fasting for at least 8 hours.   Comment 1 Notify RN    Comment 2 Document in Chart   Glucose, capillary     Status: Abnormal   Collection Time: 10/18/21 12:16 PM  Result Value Ref Range   Glucose-Capillary 255 (H) 70 - 99 mg/dL    Comment: Glucose reference range applies only to samples taken after fasting for at least 8 hours.  Glucose, capillary     Status: Abnormal   Collection Time: 10/18/21  3:04 PM  Result Value Ref Range   Glucose-Capillary 246 (H) 70 - 99 mg/dL    Comment: Glucose reference range applies only to samples taken after fasting for at least 8 hours.  Glucose, capillary     Status: Abnormal   Collection Time: 10/18/21  8:33 PM  Result Value Ref Range   Glucose-Capillary 213 (H) 70 - 99 mg/dL    Comment: Glucose reference range applies only to samples taken after fasting for at least 8 hours.  Glucose, capillary     Status: Abnormal   Collection Time: 10/18/21 11:08 PM  Result Value Ref Range   Glucose-Capillary 235 (H) 70 - 99 mg/dL    Comment: Glucose reference range applies only to samples taken after fasting for at least 8 hours.  Renal function panel     Status: Abnormal   Collection Time: 10/19/21  7:30 AM  Result Value Ref Range   Sodium 140 135 - 145 mmol/L   Potassium 3.8 3.5 - 5.1 mmol/L   Chloride 104 98 - 111 mmol/L   CO2 26 22 - 32 mmol/L   Glucose, Bld 179 (H) 70 - 99 mg/dL    Comment: Glucose reference range applies only to samples taken after fasting for at least 8 hours.   BUN 12 8 - 23 mg/dL   Creatinine, Ser 0.91 0.61 - 1.24 mg/dL   Calcium 8.8 (L) 8.9 - 10.3 mg/dL   Phosphorus 3.4 2.5 - 4.6 mg/dL   Albumin 3.1 (L) 3.5 - 5.0 g/dL   GFR, Estimated >60 >60 mL/min    Comment: (NOTE) Calculated using the CKD-EPI Creatinine Equation (2021)    Anion gap 10 5 - 15    Comment: Performed at [facility] [address], [place], [place] [address]  Magnesium     Status: None   Collection Time: 10/19/21  7:30 AM  Result Value  Ref Range   Magnesium 2.0 1.7 - 2.4 mg/dL    Comment: Performed at [facility] [address], [place], [place] [address]  CBC     Status: Abnormal   Collection Time: 10/19/21  7:30 AM  Result Value Ref Range   WBC 4.0 4.0 - 10.5 K/uL   RBC 3.90 (L) 4.22 - 5.81 MIL/uL   Hemoglobin 12.5 (L) 13.0 - 17.0 g/dL   HCT 37.8 (L) 39.0 - 52.0 %   MCV 96.9 80.0 - 100.0 fL   MCH 32.1 26.0 - 34.0 pg   MCHC 33.1 30.0 - 36.0 g/dL   RDW 13.6 11.5 - 15.5 %   Platelets 148 (L) 150 - 400 K/uL   nRBC 0.0 0.0 - 0.2 %    Comment: Performed at [facility], [place] [address], [place], [place] [address]  CK     Status: None   Collection Time: 10/19/21  7:30 AM  Result Value Ref Range   Total CK 75 49 - 397  U/L    Comment: Performed at [facility], [place] [address], [place], [place] [address]  Ammonia     Status: None   Collection Time: 10/19/21  7:30 AM  Result Value Ref Range   Ammonia 29 9 - 35 umol/L    Comment: Performed at [facility], [place] [address], [place], [place] [address]  Glucose, capillary     Status: Abnormal   Collection Time: 10/19/21  7:37 AM  Result Value Ref Range   Glucose-Capillary 179 (H) 70 - 99 mg/dL    Comment: Glucose reference range applies only to samples taken after fasting for at least 8 hours.    MR BRAIN W WO CONTRAST  Result Date: 10/18/2021 CLINICAL DATA:  Initial evaluation for brain/CNS neoplasm, staging. EXAM: MRI HEAD WITHOUT AND WITH CONTRAST TECHNIQUE: Multiplanar, multiecho pulse sequences of the brain and surrounding structures were obtained without and with intravenous contrast. CONTRAST:  91mL GADAVIST GADOBUTROL 1 MMOL/ML IV SOLN COMPARISON:  Prior CTs from 10/15/2021. FINDINGS: Brain: Cerebral volume within normal limits. Mild scattered patchy T2/FLAIR hyperintensity noted involving the periventricular and deep white matter both cerebral hemispheres, nonspecific, but most like related chronic microvascular ischemic disease, overall mild  in nature in felt to be within normal limits for age. No evidence for acute or subacute infarct. No areas of chronic cortical infarction. No acute intracranial hemorrhage. Rounded mass positioned at the left temporal lobe measures 2.4 x 2.7 x 2.8 cm (AP by transverse by craniocaudad). Lesion demonstrates hyperintense T2 signal intensity with avid heterogeneous and predominantly peripheral postcontrast enhancement. Speckled areas of internal susceptibility artifact consistent with necrosis and/or blood products. Surrounding T2/FLAIR signal intensity consistent with vasogenic edema and/or infiltrating tumor. Mild mass effect on the atrium of the left lateral ventricle without significant midline shift. 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 No other mass lesion or abnormal enhancement. No hydrocephalus or ventricular trapping. No extra-axial fluid collection. Pituitary gland suprasellar region normal. Midline structures normal. Vascular: Major intracranial vascular flow voids are maintained. Skull and upper cervical spine: Craniocervical junction within normal limits. Bone marrow signal intensity within normal limits. No focal marrow replacing lesion. No scalp soft tissue abnormality. Sinuses/Orbits: Globes and orbital soft tissues demonstrate no acute finding. Scattered mucosal thickening noted within the ethmoidal air cells. Fluid seen layering within the nasopharynx. Small bilateral mastoid effusions. Patient is intubated. Other: None. IMPRESSION: 1. 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 Surrounding vasogenic edema and/or infiltrating tumor without significant midline shift. 2. No other acute intracranial abnormality. Electronically Signed   By: Jeannine Boga M.D.   On: 10/18/2021 19:49    Review of systems not obtained due to patient factors. Blood pressure (!) 171/91, pulse  82, temperature 98.5 F (36.9 C), temperature source Oral, resp. rate 17, height 5\' 10"  (1.778 m), weight 119.7 kg, SpO2 97 %. Patient is awake and aware.  He is agitated.  He is oriented to name only.  His speech is reasonably fluent and easy but his content is very poor.  He has significant word finding and naming difficulties.  His cranial nerve function otherwise appears normal.  Motor examination 5/5 without evidence of pronator drift.  Sensory examination nonfocal.  Reflexes not tested.  Examination head ears eyes nose and throat is unremarked.  Chest and abdomen are obese but otherwise unremarkable and benign.  Extremities are free from injury deformity.  Assessment/Plan: Patient with a newly discovered 2.5 cm left temporal mass with vasogenic edema and some local mass effect causing compression of the temporal lobe and thalamus.  I would recommend beginning steroid treatment immediately for treatment of his surrounding edema and hopefully helping with some of his mass effect.  I discussed situation with patient's wife.  I recommended moving forward with left temporal craniotomy and resection of tumor.  Unfortunately this tumor is in an eloquent area of the brain and there is significant risk of worsening aphasia in addition to the usual risks of bleeding, infection, stroke, seizure, death and tumor recurrence.  The patient's wife appears to understand.  She wishes to move forward with surgery tomorrow.  We will work toward getting this scheduled.  Mallie Mussel A Dyson Sevey 10/19/2021, 9:05 AM

## 2021-10-19 NOTE — Assessment & Plan Note (Addendum)
Multiple imaging showed 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 -CT chest/abdomen/pelvis not done---Neuro oncology, Dr. Mickeal Skinner consulted as well -path pending

## 2021-10-19 NOTE — Evaluation (Signed)
Physical Therapy Evaluation Patient Details Name: Jeff Cassar Sr. MRN: [id number] DOB: 08-28-1956 Today's Date: 10/19/2021  History of Present Illness  66 year old male with acute onset of aphasia and confusion. 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 Plan is for tumor resection. Patient's medical history includes type 2 diabetes mellitus, coronary artery disease and a remote history of DVT still being treated on Eliquis and Plavix   Clinical Impression  Pt in bed upon arrival of PT, asleep upon arrival. Prior to admission the pt was mobilizing independently in the home, family reports only single fall in last 6 months, pt not needing assist for ADLs or IADLs. The pt was agitated earlier in the day resulting in family assisting the pt to the bathroom which they reported required more assist and use of RW as the pt was unsteady. Upon our arrival, the pt was difficult to arouse and unable to maintain attention when he did open his eyes. Family suspects it is due to poor sleep since arrival. Therefore we were unable to progress to OOB mobility assessment at this time, will plan to assess further after resection planned for tomorrow.       Recommendations for follow up therapy are one component of a multi-disciplinary discharge planning process, led by the attending physician.  Recommendations may be updated based on patient status, additional functional criteria and insurance authorization.  Follow Up Recommendations Other (comment) (plan to assess after surgery)    Assistance Recommended at Discharge Frequent or constant Supervision/Assistance  Patient can return home with the following  Two people to help with walking and/or transfers;Two people to help with bathing/dressing/bathroom;Assistance with cooking/housework;Direct supervision/assist for medications management;Help with  stairs or ramp for entrance    Equipment Recommendations  (assess when pt able to complete OOB mobility)  Recommendations for Other Services   (plan to assess after surgery)    Functional Status Assessment Patient has had a recent decline in their functional status and demonstrates the ability to make significant improvements in function in a reasonable and predictable amount of time.     Precautions / Restrictions Precautions Precautions: Fall Restrictions Weight Bearing Restrictions: No      Mobility  Bed Mobility Overal bed mobility: Needs Assistance                  Transfers Overall transfer level: Needs assistance                 General transfer comment: Family states he was very weak, unable to arouse pt at time of eval and family asking [place] to let him sleep.          Pertinent Vitals/Pain Pain Assessment Pain Assessment: Faces Faces Pain Scale: Hurts even more Pain Location: with taking BP Pain Descriptors / Indicators: Discomfort, Grimacing Pain Intervention(s): Limited activity within patient's tolerance, Monitored during session, Repositioned    Home Living Family/patient expects to be discharged to:: Private residence Living Arrangements: Spouse/significant other Available Help at Discharge: Family Type of Home: House Home Access: Stairs to enter Entrance Stairs-Rails: None Entrance Stairs-Number of Steps: 2   Home Layout: One level Home Equipment: Shower seat;Grab bars - toilet;Grab bars - tub/shower;Cane - single point      Prior Function Prior Level of Function : Independent/Modified Independent;Needs assist  Cognitive Assist : ADLs (cognitive)   ADLs (Cognitive): Intermittent cues         ADLs  Comments: wife assists wtih medicaiton management; wears 2.5 L at home when sleeping/napping; has support dog down stairs; enjoys doing christain word search     Hand Dominance   Dominant Hand: Right    Extremity/Trunk Assessment    Upper Extremity Assessment Upper Extremity Assessment: Defer to OT evaluation    Lower Extremity Assessment Lower Extremity Assessment: Difficult to assess due to impaired cognition (unable to arouse pt at time of eval, but noted some spontaneous movement in BLE while pt attempting to reposition due to pain from BP. hx of neuropathy bilaterally. daughter reports unsteady and legs like "jelly" when family mobilized the pt to bathroom)    Cervical / Trunk Assessment Cervical / Trunk Assessment: Other exceptions Cervical / Trunk Exceptions: large body habitus  Communication   Communication: Expressive difficulties  Cognition Arousal/Alertness: Lethargic Behavior During Therapy: Agitated                                            General Comments General comments (skin integrity, edema, etc.): HR 100-124bpm while pt at rest, SpO2 87-92% on RA, donned 2.5 L O2 (what pt uses to sleep at night at baseline)    Exercises     Assessment/Plan    PT Assessment Patient needs continued PT services  PT Problem List Decreased strength;Decreased range of motion;Decreased activity tolerance;Decreased mobility;Decreased balance       PT Treatment Interventions DME instruction;Gait training;Stair training;Functional mobility training;Therapeutic activities;Therapeutic exercise;Balance training;Patient/family education;Cognitive remediation    PT Goals (Current goals can be found in the Care Plan section)  Acute Rehab PT Goals Patient Stated Goal: none stated, wife hopeful for d/c home after surgery PT Goal Formulation: With patient/family Time For Goal Achievement: 11/02/21 Potential to Achieve Goals: Fair    Frequency Min 4X/week     Co-evaluation PT/OT/SLP Co-Evaluation/Treatment: Yes Reason for Co-Treatment: Necessary to address cognition/behavior during functional activity;For patient/therapist safety;To address functional/ADL transfers PT goals addressed during  session: Mobility/safety with mobility;Strengthening/ROM OT goals addressed during session: ADL's and self-care       AM-PAC PT "6 Clicks" Mobility  Outcome Measure Help needed turning from your back to your side while in a flat bed without using bedrails?: A Little Help needed moving from lying on your back to sitting on the side of a flat bed without using bedrails?: A Little Help needed moving to and from a bed to a chair (including a wheelchair)?: A Lot Help needed standing up from a chair using your arms (e.g., wheelchair or bedside chair)?: A Lot Help needed to walk in hospital room?: A Lot Help needed climbing 3-5 steps with a railing? : A Lot 6 Click Score: 14    End of Session Equipment Utilized During Treatment: Oxygen Activity Tolerance: Patient limited by fatigue;Patient limited by lethargy Patient left: in bed;with call bell/phone within reach;with family/visitor present Nurse Communication: Mobility status (elevated BP) PT Visit Diagnosis: Unsteadiness on feet (R26.81);Other abnormalities of gait and mobility (R26.89)    Time: 3846-6599 PT Time Calculation (min) (ACUTE ONLY): 26 min   Charges:   PT Evaluation $PT Eval Moderate Complexity: 1 Mod          West Carbo, PT, DPT   Acute Rehabilitation Department Pager #: [phone]  Sandra Cockayne 10/19/2021, 1:58 PM

## 2021-10-19 NOTE — Progress Notes (Signed)
Pt returned from MRI this shift , with increase confusion, combative ,non compliant with care, kicking and screaming. Md notified order given for Restraints.Pain medication and anti anxiety medication given with little to no effect. Bladder scan done approximately 700 removed. Pt remain restless using profanities at spouse and staff. EEG monitor in progress.Spouse remain at bedside.

## 2021-10-19 NOTE — Progress Notes (Signed)
Subjective: Overnight, patient with increased confusion, combative, non-compliant with care, kicking and screaming. Order was given for restraints. Pain medication and anti anxiety medication given with little to no effect. Bladder scan done approximately 700 removed.  Objective: Current vital signs: BP 140/75    Pulse 98    Temp 98.1 F (36.7 C) (Oral)    Resp 16    Ht 5\' 10"  (1.778 m)    Wt 119.7 kg    SpO2 96%    BMI 37.86 kg/m  Vital signs in last 24 hours: Temp:  [97.8 F (36.6 C)-99.4 F (37.4 C)] 98.1 F (36.7 C) (02/20 0304) Pulse Rate:  [92-106] 98 (02/20 0310) Resp:  [12-18] 16 (02/20 0310) BP: (124-160)/(70-96) 140/75 (02/20 0316) SpO2:  [90 %-97 %] 96 % (02/20 0310)  Intake/Output from previous day: 02/19 0701 - 02/20 0700 In: 500 [I.V.:500] Out: 700 [Urine:700] Intake/Output this shift: No intake/output data recorded. Nutritional status:  Diet Order             Diet NPO time specified  Diet effective midnight                  HEENT: [place]/AT Lungs: Respirations unlabored Ext: Warm and well perfused  Neurologic Exam: Ment: Awake and alert. Agitated with frequent yelling out stating that he wants to get OOB and urinate. Perseverates at times. Speech is fluent and non-dysarthric. Follows all simple commands but often yells and expresses reluctance to cooperate with exam. Able to count five fingers. Not oriented to the day or the year.  CN: EOMI. Counts fingers visually. Face is symmetric. Phonation intact without hoarseness or hypophonia.  Motor: Moves all extremities equally to command without asymmetry. Not compliant with detailed motor testing Sensory: Intact to tactile stimuli x 4 Reflexes: 3+ bilateral patellae. Toes downgoing bilaterally Cerebellar: No gross ataxia Gait: Unable to assess.   Lab Results: Results for orders placed or performed during the hospital encounter of 10/15/21 (from the past 48 hour(s))  Glucose, capillary     Status: Abnormal    Collection Time: 10/17/21  7:41 AM  Result Value Ref Range   Glucose-Capillary 193 (H) 70 - 99 mg/dL    Comment: Glucose reference range applies only to samples taken after fasting for at least 8 hours.  CBC     Status: Abnormal   Collection Time: 10/17/21 11:04 AM  Result Value Ref Range   WBC 5.1 4.0 - 10.5 K/uL   RBC 4.12 (L) 4.22 - 5.81 MIL/uL   Hemoglobin 13.4 13.0 - 17.0 g/dL   HCT 39.4 39.0 - 52.0 %   MCV 95.6 80.0 - 100.0 fL   MCH 32.5 26.0 - 34.0 pg   MCHC 34.0 30.0 - 36.0 g/dL   RDW 13.4 11.5 - 15.5 %   Platelets 154 150 - 400 K/uL   nRBC 0.0 0.0 - 0.2 %    Comment: Performed at [facility], [place] [address], [place], [place] [address]  Comprehensive metabolic panel     Status: Abnormal   Collection Time: 10/17/21 11:04 AM  Result Value Ref Range   Sodium 136 135 - 145 mmol/L   Potassium 3.7 3.5 - 5.1 mmol/L   Chloride 100 98 - 111 mmol/L   CO2 24 22 - 32 mmol/L   Glucose, Bld 186 (H) 70 - 99 mg/dL    Comment: Glucose reference range applies only to samples taken after fasting for at least 8 hours.   BUN 12 8 - 23  mg/dL   Creatinine, Ser 0.85 0.61 - 1.24 mg/dL   Calcium 9.0 8.9 - 10.3 mg/dL   Total Protein 6.7 6.5 - 8.1 g/dL   Albumin 3.5 3.5 - 5.0 g/dL   AST 112 (H) 15 - 41 U/L   ALT 58 (H) 0 - 44 U/L   Alkaline Phosphatase 48 38 - 126 U/L   Total Bilirubin 0.6 0.3 - 1.2 mg/dL   GFR, Estimated >60 >60 mL/min    Comment: (NOTE) Calculated using the CKD-EPI Creatinine Equation (2021)    Anion gap 12 5 - 15    Comment: Performed at [facility] [address], [place], [place] [address]  Glucose, capillary     Status: Abnormal   Collection Time: 10/17/21 11:54 AM  Result Value Ref Range   Glucose-Capillary 184 (H) 70 - 99 mg/dL    Comment: Glucose reference range applies only to samples taken after fasting for at least 8 hours.  Glucose, capillary     Status: Abnormal   Collection Time: 10/17/21  3:53 PM  Result Value Ref Range   Glucose-Capillary  343 (H) 70 - 99 mg/dL    Comment: Glucose reference range applies only to samples taken after fasting for at least 8 hours.  Glucose, capillary     Status: Abnormal   Collection Time: 10/17/21  7:16 PM  Result Value Ref Range   Glucose-Capillary 314 (H) 70 - 99 mg/dL    Comment: Glucose reference range applies only to samples taken after fasting for at least 8 hours.   Comment 1 Notify RN   Glucose, capillary     Status: Abnormal   Collection Time: 10/17/21 11:02 PM  Result Value Ref Range   Glucose-Capillary 296 (H) 70 - 99 mg/dL    Comment: Glucose reference range applies only to samples taken after fasting for at least 8 hours.  Basic metabolic panel     Status: Abnormal   Collection Time: 10/18/21  3:20 AM  Result Value Ref Range   Sodium 138 135 - 145 mmol/L   Potassium 3.3 (L) 3.5 - 5.1 mmol/L   Chloride 99 98 - 111 mmol/L   CO2 26 22 - 32 mmol/L   Glucose, Bld 266 (H) 70 - 99 mg/dL    Comment: Glucose reference range applies only to samples taken after fasting for at least 8 hours.   BUN 17 8 - 23 mg/dL   Creatinine, Ser 1.06 0.61 - 1.24 mg/dL   Calcium 8.8 (L) 8.9 - 10.3 mg/dL   GFR, Estimated >60 >60 mL/min    Comment: (NOTE) Calculated using the CKD-EPI Creatinine Equation (2021)    Anion gap 13 5 - 15    Comment: Performed at [facility] [address] [place], [place] [address]  CBC     Status: Abnormal   Collection Time: 10/18/21  3:20 AM  Result Value Ref Range   WBC 4.9 4.0 - 10.5 K/uL   RBC 3.97 (L) 4.22 - 5.81 MIL/uL   Hemoglobin 13.0 13.0 - 17.0 g/dL   HCT 37.7 (L) 39.0 - 52.0 %   MCV 95.0 80.0 - 100.0 fL   MCH 32.7 26.0 - 34.0 pg   MCHC 34.5 30.0 - 36.0 g/dL   RDW 13.6 11.5 - 15.5 %   Platelets 160 150 - 400 K/uL   nRBC 0.0 0.0 - 0.2 %    Comment: Performed at [facility], [place] [address] [place], [place] [address]  Magnesium  Status: None   Collection Time: 10/18/21  3:20 AM  Result Value Ref Range   Magnesium 1.9 1.7 - 2.4 mg/dL     Comment: Performed at [facility], [place] [address], [place], [place] [address]  Glucose, capillary     Status: Abnormal   Collection Time: 10/18/21  3:45 AM  Result Value Ref Range   Glucose-Capillary 291 (H) 70 - 99 mg/dL    Comment: Glucose reference range applies only to samples taken after fasting for at least 8 hours.  Glucose, capillary     Status: Abnormal   Collection Time: 10/18/21  7:52 AM  Result Value Ref Range   Glucose-Capillary 383 (H) 70 - 99 mg/dL    Comment: Glucose reference range applies only to samples taken after fasting for at least 8 hours.   Comment 1 Notify RN    Comment 2 Document in Chart   Glucose, capillary     Status: Abnormal   Collection Time: 10/18/21 12:16 PM  Result Value Ref Range   Glucose-Capillary 255 (H) 70 - 99 mg/dL    Comment: Glucose reference range applies only to samples taken after fasting for at least 8 hours.  Glucose, capillary     Status: Abnormal   Collection Time: 10/18/21  3:04 PM  Result Value Ref Range   Glucose-Capillary 246 (H) 70 - 99 mg/dL    Comment: Glucose reference range applies only to samples taken after fasting for at least 8 hours.  Glucose, capillary     Status: Abnormal   Collection Time: 10/18/21  8:33 PM  Result Value Ref Range   Glucose-Capillary 213 (H) 70 - 99 mg/dL    Comment: Glucose reference range applies only to samples taken after fasting for at least 8 hours.  Glucose, capillary     Status: Abnormal   Collection Time: 10/18/21 11:08 PM  Result Value Ref Range   Glucose-Capillary 235 (H) 70 - 99 mg/dL    Comment: Glucose reference range applies only to samples taken after fasting for at least 8 hours.    Recent Results (from the past 240 hour(s))  Resp Panel by RT-PCR (Flu A&B, Covid) Nasopharyngeal Swab     Status: Abnormal   Collection Time: 10/15/21 11:52 PM   Specimen: Nasopharyngeal Swab; Nasopharyngeal(NP) swabs in vial transport medium  Result Value Ref Range Status   SARS  Coronavirus 2 by RT PCR POSITIVE (A) NEGATIVE Final    Comment: (NOTE) SARS-CoV-2 target nucleic acids are DETECTED.  The SARS-CoV-2 RNA is generally detectable in upper respiratory specimens during the acute phase of infection. Positive results are indicative of the presence of the identified virus, but do not rule out bacterial infection or co-infection with other pathogens not detected by the test. Clinical correlation with patient history and other diagnostic information is necessary to determine patient infection status. The expected result is Negative.  Fact Sheet for Patients: EntrepreneurPulse.com.au  Fact Sheet for Healthcare Providers: IncredibleEmployment.be  This test is not yet approved or cleared by the [place] FDA and  has been authorized for detection and/or diagnosis of SARS-CoV-2 by FDA under an Emergency Use Authorization (EUA).  This EUA will remain in effect (meaning this test can be used) for the duration of  the COVID-19 declaration under Section 564(b)(1) of the A ct, 21 U.S.C. section 360bbb-3(b)(1), unless the authorization is terminated or revoked sooner.     Influenza A by PCR NEGATIVE NEGATIVE Final   Influenza B by PCR NEGATIVE NEGATIVE Final  Comment: (NOTE) The Xpert Xpress SARS-CoV-2/FLU/RSV plus assay is intended as an aid in the diagnosis of influenza from Nasopharyngeal swab specimens and should not be used as a sole basis for treatment. Nasal washings and aspirates are unacceptable for Xpert Xpress SARS-CoV-2/FLU/RSV testing.  Fact Sheet for Patients: EntrepreneurPulse.com.au  Fact Sheet for Healthcare Providers: IncredibleEmployment.be  This test is not yet approved or cleared by the [place] FDA and has been authorized for detection and/or diagnosis of SARS-CoV-2 by FDA under an Emergency Use Authorization (EUA). This EUA will remain in effect (meaning  this test can be used) for the duration of the COVID-19 declaration under Section 564(b)(1) of the Act, 21 U.S.C. section 360bbb-3(b)(1), unless the authorization is terminated or revoked.  Performed at [facility], [place] [address] [place], [place] [address]     Lipid Panel No results for input(s): CHOL, TRIG, HDL, CHOLHDL, VLDL, LDLCALC in the last 72 hours.  Studies/Results: MR BRAIN W WO CONTRAST  Result Date: 10/18/2021 CLINICAL DATA:  Initial evaluation for brain/CNS neoplasm, staging. EXAM: MRI HEAD WITHOUT AND WITH CONTRAST TECHNIQUE: Multiplanar, multiecho pulse sequences of the brain and surrounding structures were obtained without and with intravenous contrast. CONTRAST:  95mL GADAVIST GADOBUTROL 1 MMOL/ML IV SOLN COMPARISON:  Prior CTs from 10/15/2021. FINDINGS: Brain: Cerebral volume within normal limits. Mild scattered patchy T2/FLAIR hyperintensity noted involving the periventricular and deep white matter both cerebral hemispheres, nonspecific, but most like related chronic microvascular ischemic disease, overall mild in nature in felt to be within normal limits for age. No evidence for acute or subacute infarct. No areas of chronic cortical infarction. No acute intracranial hemorrhage. Rounded mass positioned at the left temporal lobe measures 2.4 x 2.7 x 2.8 cm (AP by transverse by craniocaudad). Lesion demonstrates hyperintense T2 signal intensity with avid heterogeneous and predominantly peripheral postcontrast enhancement. Speckled areas of internal susceptibility artifact consistent with necrosis and/or blood products. Surrounding T2/FLAIR signal intensity consistent with vasogenic edema and/or infiltrating tumor. Mild mass effect on the atrium of the left lateral ventricle without significant midline shift. 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 No other mass lesion or abnormal enhancement. No hydrocephalus or ventricular trapping. No extra-axial fluid  collection. Pituitary gland suprasellar region normal. Midline structures normal. Vascular: Major intracranial vascular flow voids are maintained. Skull and upper cervical spine: Craniocervical junction within normal limits. Bone marrow signal intensity within normal limits. No focal marrow replacing lesion. No scalp soft tissue abnormality. Sinuses/Orbits: Globes and orbital soft tissues demonstrate no acute finding. Scattered mucosal thickening noted within the ethmoidal air cells. Fluid seen layering within the nasopharynx. Small bilateral mastoid effusions. Patient is intubated. Other: None. IMPRESSION: 1. 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 Surrounding vasogenic edema and/or infiltrating tumor without significant midline shift. 2. No other acute intracranial abnormality. Electronically Signed   By: Jeannine Boga M.D.   On: 10/18/2021 19:49    Medications: Scheduled:  atorvastatin  40 mg Oral Daily   fluticasone furoate-vilanterol  1 puff Inhalation Daily   And   umeclidinium bromide  1 puff Inhalation Daily   insulin aspart  0-20 Units Subcutaneous TID WC   insulin aspart  0-5 Units Subcutaneous QHS   isosorbide mononitrate  30 mg Oral Daily   metoprolol tartrate  5 mg Intravenous Q6H   montelukast  10 mg Oral QPM   pantoprazole  40 mg Oral Daily  Continuous:  levETIRAcetam 500 mg (10/18/21 2116)    Assessment: 66 y.o. 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 Found to have a hyperemic left posterior temporal lobe lesion concerning for a potential mass which could predispose to epileptic aphasia vs seizure. He was more alert on Sunday but still extremely altered. rEEG showed L temporal sharps but no seizures. 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 There is surrounding vasogenic edema and/or infiltrating tumor without significant midline shift. - Exam today reveals an agitated patient with mild delirium and no focal motor deficits.  - EEG: Lateralized rhythmic delta activity, left temporal region. Intermittent slow, generalized. This study is suggestive of cortical dysfunction in left temporal region which is likely secondary to underlying mass.  Of note, lateralized rhythmic activity is on the ictal-interictal continuum with low potential for seizures. Additionally there is mild to moderate diffuse encephalopathy, nonspecific to etiology.  No definite seizures or epileptiform discharges were seen throughout the recording.   Recommendations: - Continue Keppra 500 mg BID - No discrete seizure activity since admission - Hematology/Oncology consult - Neurosurgery consult for possible brain biopsy.  - Although the mass on MRI may be a primary brain tumor, metastasis is also possible. CT of C/A/P with and without contrast is recommended to evaluate for possible primary   LOS: 3 days   @Electronically  signed: Dr. Kerney Elbe 10/19/2021  7:33 AM

## 2021-10-19 NOTE — Progress Notes (Signed)
LTM EEG discontinued per Neuro - no skin breakdown at [facility].

## 2021-10-20 ENCOUNTER — Inpatient Hospital Stay (HOSPITAL_COMMUNITY): Payer: Medicare Other | Admitting: Anesthesiology

## 2021-10-20 ENCOUNTER — Inpatient Hospital Stay (HOSPITAL_COMMUNITY)
Admission: EM | Disposition: A | Discharge status: Rehabilitation Facility | Payer: Self-pay | Source: Home / Self Care | Attending: Internal Medicine

## 2021-10-20 ENCOUNTER — Encounter (HOSPITAL_COMMUNITY): Payer: Self-pay | Admitting: Internal Medicine

## 2021-10-20 ENCOUNTER — Other Ambulatory Visit: Payer: Self-pay

## 2021-10-20 DIAGNOSIS — I2510 Atherosclerotic heart disease of native coronary artery without angina pectoris: Secondary | ICD-10-CM

## 2021-10-20 DIAGNOSIS — D496 Neoplasm of unspecified behavior of brain: Secondary | ICD-10-CM

## 2021-10-20 DIAGNOSIS — R451 Restlessness and agitation: Secondary | ICD-10-CM

## 2021-10-20 DIAGNOSIS — E119 Type 2 diabetes mellitus without complications: Secondary | ICD-10-CM

## 2021-10-20 DIAGNOSIS — I10 Essential (primary) hypertension: Secondary | ICD-10-CM

## 2021-10-20 HISTORY — PX: APPLICATION OF CRANIAL NAVIGATION: SHX6578

## 2021-10-20 HISTORY — PX: CRANIOTOMY: SHX93

## 2021-10-20 LAB — BASIC METABOLIC PANEL
Anion gap: 14 (ref 5–15)
BUN: 14 mg/dL (ref 8–23)
CO2: 20 mmol/L — ABNORMAL LOW (ref 22–32)
Calcium: 8.5 mg/dL — ABNORMAL LOW (ref 8.9–10.3)
Chloride: 104 mmol/L (ref 98–111)
Creatinine, Ser: 0.98 mg/dL (ref 0.61–1.24)
GFR, Estimated: >60 mL/min (ref >60)
Glucose, Bld: 241 mg/dL — ABNORMAL HIGH (ref 70–99)
Potassium: 3.5 mmol/L (ref 3.5–5.1)
Sodium: 138 mmol/L (ref 135–145)

## 2021-10-20 LAB — RENAL FUNCTION PANEL
Albumin: 3.3 g/dL — ABNORMAL LOW (ref 3.5–5.0)
Anion gap: 11 (ref 5–15)
BUN: 15 mg/dL (ref 8–23)
CO2: 25 mmol/L (ref 22–32)
Calcium: 9.2 mg/dL (ref 8.9–10.3)
Chloride: 100 mmol/L (ref 98–111)
Creatinine, Ser: 0.94 mg/dL (ref 0.61–1.24)
GFR, Estimated: >60 mL/min (ref >60)
Glucose, Bld: 295 mg/dL — ABNORMAL HIGH (ref 70–99)
Phosphorus: 3 mg/dL (ref 2.5–4.6)
Potassium: 4 mmol/L (ref 3.5–5.1)
Sodium: 136 mmol/L (ref 135–145)

## 2021-10-20 LAB — CBC
HCT: 39.1 % (ref 39.0–52.0)
HCT: 38.9 % — ABNORMAL LOW (ref 39.0–52.0)
Hemoglobin: 12.9 g/dL — ABNORMAL LOW (ref 13.0–17.0)
Hemoglobin: 12.5 g/dL — ABNORMAL LOW (ref 13.0–17.0)
MCH: 31.5 pg (ref 26.0–34.0)
MCH: 31.4 pg (ref 26.0–34.0)
MCHC: 33 g/dL (ref 30.0–36.0)
MCHC: 32.1 g/dL (ref 30.0–36.0)
MCV: 95.4 fL (ref 80.0–100.0)
MCV: 97.7 fL (ref 80.0–100.0)
Platelets: 163 10*3/uL (ref 150–400)
Platelets: 199 10*3/uL (ref 150–400)
RBC: 4.1 MIL/uL — ABNORMAL LOW (ref 4.22–5.81)
RBC: 3.98 MIL/uL — ABNORMAL LOW (ref 4.22–5.81)
RDW: 13.2 % (ref 11.5–15.5)
RDW: 13.2 % (ref 11.5–15.5)
WBC: 6.4 10*3/uL (ref 4.0–10.5)
WBC: 7.6 10*3/uL (ref 4.0–10.5)
nRBC: 0 % (ref 0.0–0.2)
nRBC: 0 % (ref 0.0–0.2)

## 2021-10-20 LAB — GLUCOSE, CAPILLARY
Glucose-Capillary: 209 mg/dL — ABNORMAL HIGH (ref 70–99)
Glucose-Capillary: 223 mg/dL — ABNORMAL HIGH (ref 70–99)
Glucose-Capillary: 243 mg/dL — ABNORMAL HIGH (ref 70–99)
Glucose-Capillary: 273 mg/dL — ABNORMAL HIGH (ref 70–99)
Glucose-Capillary: 284 mg/dL — ABNORMAL HIGH (ref 70–99)
Glucose-Capillary: 317 mg/dL — ABNORMAL HIGH (ref 70–99)

## 2021-10-20 LAB — MAGNESIUM: Magnesium: 2 mg/dL (ref 1.7–2.4)

## 2021-10-20 SURGERY — CRANIOTOMY TUMOR EXCISION
Anesthesia: General | Laterality: Left

## 2021-10-20 MED ORDER — FENTANYL CITRATE (PF) 250 MCG/5ML IJ SOLN
INTRAMUSCULAR | Status: AC
  Filled 2021-10-20: qty 5

## 2021-10-20 MED ORDER — SUCCINYLCHOLINE CHLORIDE 200 MG/10ML IV SOSY
PREFILLED_SYRINGE | INTRAVENOUS | Status: DC | PRN
  Administered 2021-10-20: 140 mg via INTRAVENOUS

## 2021-10-20 MED ORDER — THROMBIN 5000 UNITS EX SOLR
CUTANEOUS | Status: AC
  Filled 2021-10-20: qty 5000

## 2021-10-20 MED ORDER — ONDANSETRON HCL 4 MG PO TABS: 4.0000 mg | ORAL_TABLET | ORAL | Status: DC | PRN

## 2021-10-20 MED ORDER — LIDOCAINE-EPINEPHRINE 1 %-1:100000 IJ SOLN
INTRAMUSCULAR | Status: AC
  Filled 2021-10-20: qty 1

## 2021-10-20 MED ORDER — INSULIN ASPART 100 UNIT/ML IJ SOLN
INTRAMUSCULAR | Status: DC | PRN
  Administered 2021-10-20: 4 [IU] via SUBCUTANEOUS

## 2021-10-20 MED ORDER — LACTATED RINGERS IV SOLN: INTRAVENOUS | Status: DC | PRN

## 2021-10-20 MED ORDER — SUGAMMADEX SODIUM 200 MG/2ML IV SOLN
INTRAVENOUS | Status: DC | PRN
  Administered 2021-10-20: 400 mg via INTRAVENOUS

## 2021-10-20 MED ORDER — HYDROMORPHONE HCL 1 MG/ML IJ SOLN
0.5000 mg | INTRAMUSCULAR | Status: DC | PRN
  Administered 2021-10-20 – 2021-10-21 (×7): 1 mg via INTRAVENOUS
  Filled 2021-10-20 (×7): qty 1

## 2021-10-20 MED ORDER — MICROFIBRILLAR COLL HEMOSTAT EX PADS
MEDICATED_PAD | CUTANEOUS | Status: DC | PRN
  Administered 2021-10-20: 1 via TOPICAL

## 2021-10-20 MED ORDER — PANTOPRAZOLE SODIUM 40 MG IV SOLR
40.0000 mg | Freq: Every day | INTRAVENOUS | Status: DC
  Administered 2021-10-20 – 2021-10-23 (×4): 40 mg via INTRAVENOUS
  Filled 2021-10-20 (×4): qty 10

## 2021-10-20 MED ORDER — PROPOFOL 10 MG/ML IV BOLUS
INTRAVENOUS | Status: DC | PRN
  Administered 2021-10-20: 160 mg via INTRAVENOUS

## 2021-10-20 MED ORDER — LABETALOL HCL 5 MG/ML IV SOLN
5.0000 mg | Freq: Once | INTRAVENOUS | Status: AC
  Administered 2021-10-20: 5 mg via INTRAVENOUS

## 2021-10-20 MED ORDER — PROPOFOL 10 MG/ML IV BOLUS
INTRAVENOUS | Status: AC
  Filled 2021-10-20: qty 20

## 2021-10-20 MED ORDER — PROMETHAZINE HCL 12.5 MG PO TABS
12.5000 mg | ORAL_TABLET | ORAL | Status: DC | PRN
  Filled 2021-10-20: qty 2

## 2021-10-20 MED ORDER — LEVETIRACETAM IN NACL 500 MG/100ML IV SOLN
500.0000 mg | Freq: Once | INTRAVENOUS | Status: AC
  Administered 2021-10-20: 500 mg via INTRAVENOUS
  Filled 2021-10-20: qty 100

## 2021-10-20 MED ORDER — CEFAZOLIN SODIUM-DEXTROSE 2-3 GM-%(50ML) IV SOLR
INTRAVENOUS | Status: DC | PRN
  Administered 2021-10-20: 2 g via INTRAVENOUS

## 2021-10-20 MED ORDER — FENTANYL CITRATE (PF) 100 MCG/2ML IJ SOLN
INTRAMUSCULAR | Status: DC | PRN
  Administered 2021-10-20 (×2): 100 ug via INTRAVENOUS
  Administered 2021-10-20: 50 ug via INTRAVENOUS
  Administered 2021-10-20: 100 ug via INTRAVENOUS

## 2021-10-20 MED ORDER — CEFAZOLIN SODIUM 1 G IJ SOLR
INTRAMUSCULAR | Status: AC
  Filled 2021-10-20: qty 20

## 2021-10-20 MED ORDER — SODIUM CHLORIDE 0.9 % IV SOLN: INTRAVENOUS | Status: DC

## 2021-10-20 MED ORDER — LABETALOL HCL 5 MG/ML IV SOLN
10.0000 mg | INTRAVENOUS | Status: DC | PRN
  Administered 2021-10-20: 40 mg via INTRAVENOUS
  Administered 2021-10-20: 20 mg via INTRAVENOUS
  Administered 2021-10-21 (×2): 40 mg via INTRAVENOUS
  Administered 2021-10-21: 20 mg via INTRAVENOUS
  Filled 2021-10-20 (×3): qty 4
  Filled 2021-10-20 (×2): qty 8
  Filled 2021-10-20: qty 4
  Filled 2021-10-20: qty 8

## 2021-10-20 MED ORDER — ONDANSETRON HCL 4 MG/2ML IJ SOLN
INTRAMUSCULAR | Status: DC | PRN
  Administered 2021-10-20: 4 mg via INTRAVENOUS

## 2021-10-20 MED ORDER — ROCURONIUM BROMIDE 10 MG/ML (PF) SYRINGE
PREFILLED_SYRINGE | INTRAVENOUS | Status: DC | PRN
  Administered 2021-10-20: 40 mg via INTRAVENOUS
  Administered 2021-10-20: 30 mg via INTRAVENOUS

## 2021-10-20 MED ORDER — SODIUM CHLORIDE 0.9 % IR SOLN
Status: DC | PRN
  Administered 2021-10-20: 1000 mL
  Administered 2021-10-20: 3000 mL

## 2021-10-20 MED ORDER — ONDANSETRON HCL 4 MG/2ML IJ SOLN: 4.0000 mg | INTRAMUSCULAR | Status: DC | PRN

## 2021-10-20 MED ORDER — LIDOCAINE-EPINEPHRINE 1 %-1:100000 IJ SOLN
INTRAMUSCULAR | Status: DC | PRN
  Administered 2021-10-20: 20 mL

## 2021-10-20 MED ORDER — LIDOCAINE 2% (20 MG/ML) 5 ML SYRINGE
INTRAMUSCULAR | Status: AC
  Filled 2021-10-20: qty 5

## 2021-10-20 MED ORDER — BACITRACIN ZINC 500 UNIT/GM EX OINT
TOPICAL_OINTMENT | CUTANEOUS | Status: AC
  Filled 2021-10-20: qty 28.35

## 2021-10-20 MED ORDER — LABETALOL HCL 5 MG/ML IV SOLN
INTRAVENOUS | Status: AC
  Filled 2021-10-20: qty 4

## 2021-10-20 MED ORDER — LIDOCAINE 2% (20 MG/ML) 5 ML SYRINGE
INTRAMUSCULAR | Status: DC | PRN
  Administered 2021-10-20: 60 mg via INTRAVENOUS

## 2021-10-20 MED ORDER — ROCURONIUM BROMIDE 10 MG/ML (PF) SYRINGE
PREFILLED_SYRINGE | INTRAVENOUS | Status: AC
  Filled 2021-10-20: qty 10

## 2021-10-20 MED ORDER — CHLORHEXIDINE GLUCONATE CLOTH 2 % EX PADS
6.0000 | MEDICATED_PAD | Freq: Every day | CUTANEOUS | Status: DC
  Administered 2021-10-20 – 2021-10-26 (×7): 6 via TOPICAL

## 2021-10-20 MED ORDER — GELATIN ABSORBABLE MT POWD
OROMUCOSAL | Status: DC | PRN
  Administered 2021-10-20 (×3): 5 mL via TOPICAL

## 2021-10-20 MED ORDER — THROMBIN 20000 UNITS EX SOLR
CUTANEOUS | Status: DC | PRN
  Administered 2021-10-20: 20 mL via TOPICAL

## 2021-10-20 MED ORDER — CEFAZOLIN SODIUM-DEXTROSE 2-4 GM/100ML-% IV SOLN
2.0000 g | Freq: Three times a day (TID) | INTRAVENOUS | Status: AC
  Administered 2021-10-20 (×2): 2 g via INTRAVENOUS
  Filled 2021-10-20 (×2): qty 100

## 2021-10-20 MED ORDER — LABETALOL HCL 5 MG/ML IV SOLN
INTRAVENOUS | Status: DC | PRN
  Administered 2021-10-20 (×2): 10 mg via INTRAVENOUS

## 2021-10-20 MED ORDER — INSULIN GLARGINE-YFGN 100 UNIT/ML ~~LOC~~ SOLN
15.0000 [IU] | Freq: Two times a day (BID) | SUBCUTANEOUS | Status: DC
  Administered 2021-10-20 – 2021-10-23 (×6): 15 [IU] via SUBCUTANEOUS
  Filled 2021-10-20 (×9): qty 0.15

## 2021-10-20 MED ORDER — THROMBIN 20000 UNITS EX SOLR
CUTANEOUS | Status: AC
  Filled 2021-10-20: qty 20000

## 2021-10-20 MED ORDER — BACITRACIN ZINC 500 UNIT/GM EX OINT
TOPICAL_OINTMENT | CUTANEOUS | Status: DC | PRN
  Administered 2021-10-20: 1 via TOPICAL

## 2021-10-20 SURGICAL SUPPLY — 69 items
BAG COUNTER SPONGE SURGICOUNT (BAG) ×2 IMPLANT
BAG DECANTER FOR FLEXI CONT (MISCELLANEOUS) ×2 IMPLANT
BAG SPNG CNTER NS LX DISP (BAG) ×1
BAND INSRT 18 STRL LF DISP RB (MISCELLANEOUS)
BAND RUBBER #18 3X1/16 STRL (MISCELLANEOUS) IMPLANT
BLADE CLIPPER SURG (BLADE) ×2 IMPLANT
BNDG COHESIVE 4X5 TAN STRL (GAUZE/BANDAGES/DRESSINGS) ×2 IMPLANT
BUR ACORN 6.0 PRECISION (BURR) ×2 IMPLANT
BUR SPIRAL ROUTER 2.3 (BUR) IMPLANT
CANISTER SUCT 3000ML PPV (MISCELLANEOUS) ×4 IMPLANT
CARTRIDGE OIL MAESTRO DRILL (MISCELLANEOUS) ×1 IMPLANT
CLIP VESOCCLUDE MED 6/CT (CLIP) ×2 IMPLANT
CNTNR URN SCR LID CUP LEK RST (MISCELLANEOUS) ×1 IMPLANT
CONT SPEC 4OZ STRL OR WHT (MISCELLANEOUS) ×2
COVER BURR HOLE 7 (Orthopedic Implant) ×3 IMPLANT
COVER BURR HOLE UNIV 10 (Orthopedic Implant) ×1 IMPLANT
DIFFUSER DRILL AIR PNEUMATIC (MISCELLANEOUS) ×2 IMPLANT
DRAIN CHANNEL 10M FLAT 3/4 FLT (DRAIN) IMPLANT
DRAPE CAMERA VIDEO/LASER (DRAPES) IMPLANT
DRAPE MICROSCOPE LEICA (MISCELLANEOUS) ×2 IMPLANT
DRAPE NEUROLOGICAL W/INCISE (DRAPES) ×2 IMPLANT
DRAPE STERI IOBAN 125X83 (DRAPES) IMPLANT
DRAPE SURG 17X23 STRL (DRAPES) IMPLANT
DRAPE WARM FLUID 44X44 (DRAPES) ×2 IMPLANT
ELECT CAUTERY BLADE 6.4 (BLADE) ×2 IMPLANT
ELECT REM PT RETURN 9FT ADLT (ELECTROSURGICAL) ×2
ELECTRODE REM PT RTRN 9FT ADLT (ELECTROSURGICAL) ×1 IMPLANT
EVACUATOR SILICONE 100CC (DRAIN) IMPLANT
FORCEPS BIPOLAR SPETZLER 8 1.0 (NEUROSURGERY SUPPLIES) ×1 IMPLANT
GAUZE 4X4 16PLY ~~LOC~~+RFID DBL (SPONGE) IMPLANT
GAUZE SPONGE 4X4 12PLY STRL (GAUZE/BANDAGES/DRESSINGS) ×2 IMPLANT
GLOVE EXAM NITRILE XL STR (GLOVE) IMPLANT
GLOVE SURG LTX SZ9 (GLOVE) ×2 IMPLANT
GOWN STRL REUS W/ TWL LRG LVL3 (GOWN DISPOSABLE) IMPLANT
GOWN STRL REUS W/ TWL XL LVL3 (GOWN DISPOSABLE) IMPLANT
GOWN STRL REUS W/TWL 2XL LVL3 (GOWN DISPOSABLE) ×1 IMPLANT
GOWN STRL REUS W/TWL LRG LVL3 (GOWN DISPOSABLE) ×2
GOWN STRL REUS W/TWL XL LVL3 (GOWN DISPOSABLE) ×4
GRAFT DURAGEN MATRIX 2WX2L ×1 IMPLANT
HEMOSTAT POWDER SURGIFOAM 1G (HEMOSTASIS) ×3 IMPLANT
HEMOSTAT SURGICEL 2X14 (HEMOSTASIS) ×2 IMPLANT
KIT BASIN OR (CUSTOM PROCEDURE TRAY) ×2 IMPLANT
KIT TURNOVER KIT B (KITS) ×2 IMPLANT
MARKER SPHERE PSV REFLC 13MM (MARKER) ×4 IMPLANT
NDL HYPO 18GX1.5 BLUNT FILL (NEEDLE) IMPLANT
NDL HYPO 25X1 1.5 SAFETY (NEEDLE) ×1 IMPLANT
NEEDLE HYPO 18GX1.5 BLUNT FILL (NEEDLE) IMPLANT
NEEDLE HYPO 25X1 1.5 SAFETY (NEEDLE) ×2 IMPLANT
NS IRRIG 1000ML POUR BTL (IV SOLUTION) ×6 IMPLANT
OIL CARTRIDGE MAESTRO DRILL (MISCELLANEOUS) ×2
PACK CRANIOTOMY CUSTOM (CUSTOM PROCEDURE TRAY) ×2 IMPLANT
PAD ARMBOARD 7.5X6 YLW CONV (MISCELLANEOUS) ×3 IMPLANT
PATTIES SURGICAL .25X.25 (GAUZE/BANDAGES/DRESSINGS) IMPLANT
PATTIES SURGICAL .5 X.5 (GAUZE/BANDAGES/DRESSINGS) IMPLANT
PATTIES SURGICAL .5 X3 (DISPOSABLE) IMPLANT
PATTIES SURGICAL 1X1 (DISPOSABLE) IMPLANT
SCREW UNIII AXS SD 1.5X4 (Screw) ×16 IMPLANT
SPONGE NEURO XRAY DETECT 1X3 (DISPOSABLE) IMPLANT
SPONGE SURGIFOAM ABS GEL 100 (HEMOSTASIS) ×2 IMPLANT
STAPLER VISISTAT 35W (STAPLE) ×3 IMPLANT
SUT NURALON 4 0 TR CR/8 (SUTURE) ×6 IMPLANT
SUT VIC AB 2-0 CT2 18 VCP726D (SUTURE) ×4 IMPLANT
SYR CONTROL 10ML LL (SYRINGE) ×2 IMPLANT
TAPE CLOTH SURG 6X10 WHT LF (GAUZE/BANDAGES/DRESSINGS) ×1 IMPLANT
TOWEL GREEN STERILE (TOWEL DISPOSABLE) ×2 IMPLANT
TOWEL GREEN STERILE FF (TOWEL DISPOSABLE) ×2 IMPLANT
TRAY FOLEY MTR SLVR 16FR STAT (SET/KITS/TRAYS/PACK) ×2 IMPLANT
UNDERPAD 30X36 HEAVY ABSORB (UNDERPADS AND DIAPERS) ×2 IMPLANT
WATER STERILE IRR 1000ML POUR (IV SOLUTION) ×2 IMPLANT

## 2021-10-20 NOTE — Brief Op Note (Signed)
10/15/2021 - 10/20/2021  3:40 PM  PATIENT:  Jeff Payment Sr.  66 y.o. male  PRE-OPERATIVE DIAGNOSIS:  Brain tumor  POST-OPERATIVE DIAGNOSIS:  Brain tumor  PROCEDURE:  Procedure(s): Left temporal craniotomy for tumor with Brain Lab (Left) APPLICATION OF CRANIAL NAVIGATION (Left)  SURGEON:  Surgeon(s) and Role:    Earnie Larsson, MD - Primary  PHYSICIAN ASSISTANT:   ASSISTANTSMearl Latin   ANESTHESIA:   general  EBL:  100 mL   BLOOD ADMINISTERED:none  DRAINS: none   LOCAL MEDICATIONS USED:  LIDOCAINE   SPECIMEN:  Source of Specimen:  Right temporal lobe  DISPOSITION OF SPECIMEN:  PATHOLOGY  COUNTS:  YES  TOURNIQUET:  * No tourniquets in log *  DICTATION: .Dragon Dictation  PLAN OF CARE: Admit to inpatient   PATIENT DISPOSITION:  PACU - hemodynamically stable.   Delay start of Pharmacological VTE agent (>24hrs) due to surgical blood loss or risk of bleeding: yes

## 2021-10-20 NOTE — Transfer of Care (Signed)
Immediate Anesthesia Transfer of Care Note  Patient: Jeff Delis Sr.  Procedure(s) Performed: Left temporal craniotomy for tumor with Brain Lab (Left) APPLICATION OF CRANIAL NAVIGATION (Left)  Patient Location: PACU  Anesthesia Type:General  Level of Consciousness: drowsy and patient cooperative  Airway & Oxygen Therapy: Patient Spontanous Breathing and Patient connected to face mask oxygen  Post-op Assessment: Report given to RN and Post -op Vital signs reviewed and stable  Post vital signs: Reviewed and stable  Last Vitals:  Vitals Value Taken Time  BP 112/78 10/20/21 1613  Temp    Pulse 100 10/20/21 1616  Resp 18 10/20/21 1619  SpO2 92 % 10/20/21 1616  Vitals shown include unvalidated device data.  Last Pain:  Vitals:   10/20/21 1600  TempSrc:   PainSc: 0-No pain         Complications: No notable events documented.

## 2021-10-20 NOTE — Progress Notes (Signed)
Notified by RN, that patient is agitated and attempting to pull off lines and tubings after he returned from OR. He was given IV haldol 2 mg without improvement.  When I arrived, patient is confused and restless. Wants to get up and go to bathroom.  Bilateral wrist restraints and abdominal belt applied.  Wife and daughter at bedside Blood pressure markedly elevated partly due agitation.  -Advised RN to give bedtime Seroquel early. IV haldol PRN if no improvement -Patient already in ICU. Notify PCCM for Precedex if agitation doesn't improve -Continue restraints for now but will discontinue as soon as possible since this would make agitation worse   Elevated BP: unreliable since patient is agitated -Has a order for PRN labetalol by neurosurgery

## 2021-10-20 NOTE — Anesthesia Procedure Notes (Signed)
Procedure Name: Intubation Date/Time: 10/20/2021 1:41 PM Performed by: Theresa Mulligan, RN Pre-anesthesia Checklist: Patient identified, Emergency Drugs available, Suction available and Patient being monitored Patient Re-evaluated:Patient Re-evaluated prior to induction Oxygen Delivery Method: Circle System Utilized Preoxygenation: Pre-oxygenation with 100% oxygen Induction Type: IV induction Ventilation: Two handed mask ventilation required and Oral airway inserted - appropriate to patient size Laryngoscope Size: Mac and 4 Grade View: Grade I Tube type: Oral Tube size: 7.5 mm Number of attempts: 1 Airway Equipment and Method: Stylet and Oral airway Placement Confirmation: ETT inserted through vocal cords under direct vision, positive ETCO2 and breath sounds checked- equal and bilateral Secured at: 23 cm Tube secured with: Tape Dental Injury: Teeth and Oropharynx as per pre-operative assessment

## 2021-10-20 NOTE — Progress Notes (Addendum)
PROGRESS NOTE  Jeff Wood. PTW:[id number] DOB: 02-16-1956   PCP: Pcp, No  Patient is from: Lives with Jeff Wood.  Independently ambulates at baseline.  DOA: 10/15/2021 LOS: 4  Chief complaints:  Chief Complaint  Patient presents with   Code Stroke     Brief Narrative / Interim history: 66 year old M with PMH of CAD/remote stent on Plavix, chronic RF on 2 L, COPD, chronic pain on opiate, extensive DVT on Eliquis, DM-2, HTN, HLD and morbid obesity presenting with acute onset confusion and aphasia with concern for CVA.  Reportedly took 2 of the 200 mg trazodone and he is the pain medication prior to arrival.  UDS negative.  He tested positive for COVID-19 but symptomatic for over 2 weeks.   CT head concerning for 3 to 4 cm region of subacute infarction versus mass in left posterior temporal lobe.  Neurology consulted.  He was started on Keppra 500 mg twice daily. Spot and LTM EEG without seizure or epileptiform discharge.    CT angio head and neck and CT cerebral perfusion with contrast without LVO but showed 2.5 cm hyperemic mass in left posterior temporal lobe.   MRI brain without contrast also showed mass lesion of the left temporal lobe with associated edema but truncated exam due to patient's inability to tolerate setting of mental status change.  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    Neurosurgery consulted and started Decadron.  Plan for left temporal craniotomy and resection of tumor on 2/21.  Neurology, neurosurgery neurology and neurosurgery following.  Neurology and neurooncology, Dr. Mickeal Skinner recommended CT chest/abdomen/pelvis to exclude for possible primary elsewhere.  Patient remains confused but improved.   Subjective: Seen and examined earlier this morning.  No major events overnight  of this morning.  Heart to go to bathroom frequently for urination likely due to hypoglycemia.  Remains confused but not agitated.  Patient's Jeff Wood and Jeff Wood at bedside.  Objective: Vitals:   10/20/21 0319 10/20/21 0326 10/20/21 0327 10/20/21 0817  BP:  (!) 165/87 (!) 156/87 (!) 150/67  Pulse:  77 80 70  Resp:  17 18 18   Temp: 98.8 F (37.1 C)   98.6 F (37 C)  TempSrc: Oral   Oral  SpO2:  94% 94% 92%  Weight:      Height:        Examination:  GENERAL: No apparent distress.  Nontoxic. HEENT: MMM.  Vision and hearing grossly intact.  NECK: Supple.  No apparent JVD.  RESP: 92% on RA.  No IWOB.  Fair aeration bilaterally. CVS:  RRR. Heart sounds normal.  ABD/GI/GU: BS+. Abd soft, NTND.  MSK/EXT:  Moves extremities. No apparent deformity. No edema.  SKIN: no apparent skin lesion or wound NEURO: Sleepy but wakes to voice.  Completely disoriented.  Does not follow commands.  Still with aphasia.  No apparent focal neuro deficit but limited exam PSYCH: Calm.  No distress or agitation.  Procedures:  None  Microbiology summarized: COVID-19 PCR positive.  Assessment and Plan: * Acute toxic metabolic encephalopathy with aphasia with new left temporal mass- (present on admission) Acute onset.  Does not seem to have other focal neuro deficit but limited exam due to confusion.  He is only oriented to himself.  No seizure activity since admission.  Spot and LTM EEG negative for seizure. UDS, EtOH  and TSH negative.  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 -Continue Keppra 500 mg twice daily -Continue p.o. 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 -CT chest/abdomen/pelvis pending.  -Neuro oncology, Dr. Mickeal Skinner consulted as well -Left voicemail for oncology, Dr. Irene Limbo  Sinus bradycardia- (present on admission) Likely due to beta-blocker.  He was on metoprolol 100 mg twice daily at home -Beta-blocker as above. -Continue telemetry monitoring  Lab test positive for detection of COVID-19 virus- (present on admission) Previous attending discussed with lab (CT value 38.1).  Per Jeff Wood, patient has received initial 2 doses of COVID-vaccine and 1 booster shot.  He started having cough and congestion 2 weeks ago.  Chronically uses 2.5 L supplemental oxygen and no change in oxygen requirement from baseline.  CXR without acute finding.  Inflammatory markers within normal. -Continue airborne precaution per hospital policy  Diabetes mellitus type 2, noninsulin dependent ([place]) Uncontrolled NIDDM-2 with hyperglycemia and other complications: M2T 1.1%.  Seems she is only on glipizide at home.  Hyperglycemia likely due to steroid. Recent Labs  Lab 10/19/21 1703 10/19/21 1946 10/19/21 2140 10/20/21 0757 10/20/21 1156  GLUCAP 295* 281* 317* 317* 273*  -Continue SSI-steroid scale. -Increase basal insulin from 15 units daily to 15 units twice daily -NovoLog 6 units 3 times daily with meals if he eats> 50% of his meals -Continue home Lipitor -He would benefit from GLP-1 agonist given morbid obesity and cardiac history.   CAD S/P percutaneous coronary angioplasty No anginal symptoms. -Continue IV metoprolol 5 mg every 6 hours while NPO. -Continue holding Hyzaar. -Continue holding torsemide.  Per Jeff Wood, takes Lasix due to dehydration with torsemide.  -Hold Plavix and Eliquis pending surgical intervention for brain tumor  Essential hypertension Stable. -Cardiac meds as above.  History of DVT (deep vein thrombosis) -Hold Eliquis  Agitation Seen encephalopathy  above  Hypokalemia Resolved.  COPD with chronic hypoxic respiratory failure On 2.5 L by [place] at baseline.  Stable. -Continue home Trelegy Ellipta -DuoNeb as needed  Hyperlipidemia - Atorvastatin as above.  Morbid obesity ([name]) Meets criteria with his BMI and history of diabetes Body mass index is 37.86 kg/m. -Encourage lifestyle change to lose weight -Could consider GLP-1 agonist instead of glipizide to help with weight loss       DVT prophylaxis:  SCD's Start: 10/19/21 1959 Place and maintain sequential compression device Start: 10/17/21 1655  Code Status: Full code Family Communication: Updated patient's Jeff Wood and Jeff Wood at bedside. Level of care: Telemetry Medical Status is: Inpatient Remains inpatient appropriate because: Due to acute encephalopathy/agitation and further evaluation and management of brain tumor    Final disposition: TBD     Consultants:  Neurology Neurosurgery Neuro oncology over the phone Oncology   Sch Meds:  Scheduled Meds:  atorvastatin  40 mg Oral Daily   dexamethasone (DECADRON) injection  4 mg Intravenous Q6H   fluticasone furoate-vilanterol  1 puff Inhalation Daily   And   umeclidinium bromide  1 puff Inhalation Daily   insulin aspart  0-20 Units Subcutaneous TID WC   insulin aspart  0-5 Units Subcutaneous QHS   insulin aspart  6 Units Subcutaneous TID WC  insulin glargine-yfgn  15 Units Subcutaneous BID   isosorbide mononitrate  30 mg Oral Daily   metoprolol tartrate  5 mg Intravenous Q6H   montelukast  10 mg Oral QPM   mupirocin ointment  1 application Nasal BID   pantoprazole  40 mg Oral Daily   QUEtiapine  25 mg Oral q AM   And   QUEtiapine  50 mg Oral QHS   Continuous Infusions:  levETIRAcetam 500 mg (10/20/21 0918)   PRN Meds:.acetaminophen **OR** acetaminophen, haloperidol lactate, ipratropium-albuterol, nitroGLYCERIN, oxyCODONE-acetaminophen, traZODone  Antimicrobials: Anti-infectives (From admission,  onward)    None        I have personally reviewed the following labs and images: CBC: Recent Labs  Lab 10/15/21 2138 10/15/21 2148 10/17/21 1104 10/18/21 0320 10/19/21 0730 10/20/21 0753  WBC 4.7  --  5.1 4.9 4.0 6.4  NEUTROABS 2.6  --   --   --   --   --   HGB 14.0 13.9 13.4 13.0 12.5* 12.9*  HCT 43.4 41.0 39.4 37.7* 37.8* 39.1  MCV 99.5  --  95.6 95.0 96.9 95.4  PLT 166  --  154 160 148* 163   BMP &GFR Recent Labs  Lab 10/15/21 2138 10/15/21 2148 10/17/21 1104 10/18/21 0320 10/19/21 0730 10/20/21 0753  NA 137 138 136 138 140 136  K 4.0 4.0 3.7 3.3* 3.8 4.0  CL 99 99 100 99 104 100  CO2 28  --  24 26 26 25   GLUCOSE 230* 226* 186* 266* 179* 295*  BUN 15 17 12 17 12 15   CREATININE 0.91 0.90 0.85 1.06 0.91 0.94  CALCIUM 9.1  --  9.0 8.8* 8.8* 9.2  MG  --   --   --  1.9 2.0 2.0  PHOS  --   --   --   --  3.4 3.0   Estimated Creatinine Clearance: 98.9 mL/min (by C-G formula based on SCr of 0.94 mg/dL). Liver & Pancreas: Recent Labs  Lab 10/15/21 2138 10/17/21 1104 10/19/21 0730 10/20/21 0753  AST 44* 112*  --   --   ALT 42 58*  --   --   ALKPHOS 48 48  --   --   BILITOT 0.4 0.6  --   --   PROT 7.2 6.7  --   --   ALBUMIN 3.6 3.5 3.1* 3.3*   No results for input(s): LIPASE, AMYLASE in the last 168 hours. Recent Labs  Lab 10/19/21 0730  AMMONIA 29   Diabetic: No results for input(s): HGBA1C in the last 72 hours.  Recent Labs  Lab 10/19/21 1703 10/19/21 1946 10/19/21 2140 10/20/21 0757 10/20/21 1156  GLUCAP 295* 281* 317* 317* 273*   Cardiac Enzymes: Recent Labs  Lab 10/19/21 0730  CKTOTAL 75   No results for input(s): PROBNP in the last 8760 hours. Coagulation Profile: Recent Labs  Lab 10/15/21 2138  INR 1.2   Thyroid Function Tests: No results for input(s): TSH, T4TOTAL, FREET4, T3FREE, THYROIDAB in the last 72 hours.  Lipid Profile: No results for input(s): CHOL, HDL, LDLCALC, TRIG, CHOLHDL, LDLDIRECT in the last 72  hours. Anemia Panel: No results for input(s): VITAMINB12, FOLATE, FERRITIN, TIBC, IRON, RETICCTPCT in the last 72 hours.  Urine analysis:    Component Value Date/Time   COLORURINE STRAW (A) 10/15/2021 2311   APPEARANCEUR CLEAR 10/15/2021 2311   LABSPEC 1.010 10/15/2021 2311   PHURINE 6.5 10/15/2021 2311   GLUCOSEU >=500 (A) 10/15/2021 2311   HGBUR TRACE (A) 10/15/2021  [place] 10/15/2021 [place] 10/15/2021 2311   PROTEINUR NEGATIVE 10/15/2021 2311   NITRITE NEGATIVE 10/15/2021 2311   LEUKOCYTESUR NEGATIVE 10/15/2021 2311   Sepsis Labs: Invalid input(s): PROCALCITONIN, [place]  Microbiology: Recent Results (from the past 240 hour(s))  Resp Panel by RT-PCR (Flu A&B, Covid) Nasopharyngeal Swab     Status: Abnormal   Collection Time: 10/15/21 11:52 PM   Specimen: Nasopharyngeal Swab; Nasopharyngeal(NP) swabs in vial transport medium  Result Value Ref Range Status   SARS Coronavirus 2 by RT PCR POSITIVE (A) NEGATIVE Final    Comment: (NOTE) SARS-CoV-2 target nucleic acids are DETECTED.  The SARS-CoV-2 RNA is generally detectable in upper respiratory specimens during the acute phase of infection. Positive results are indicative of the presence of the identified virus, but do not rule out bacterial infection or co-infection with other pathogens not detected by the test. Clinical correlation with patient history and other diagnostic information is necessary to determine patient infection status. The expected result is Negative.  Fact Sheet for Patients: EntrepreneurPulse.com.au  Fact Sheet for Healthcare Providers: IncredibleEmployment.be  This test is not yet approved or cleared by the [place] FDA and  has been authorized for detection and/or diagnosis of SARS-CoV-2 by FDA under an Emergency Use Authorization (EUA).  This EUA will remain in effect (meaning this test can be used) for the  duration of  the COVID-19 declaration under Section 564(b)(1) of the A ct, 21 U.S.C. section 360bbb-3(b)(1), unless the authorization is terminated or revoked sooner.     Influenza A by PCR NEGATIVE NEGATIVE Final   Influenza B by PCR NEGATIVE NEGATIVE Final    Comment: (NOTE) The Xpert Xpress SARS-CoV-2/FLU/RSV plus assay is intended as an aid in the diagnosis of influenza from Nasopharyngeal swab specimens and should not be used as a sole basis for treatment. Nasal washings and aspirates are unacceptable for Xpert Xpress SARS-CoV-2/FLU/RSV testing.  Fact Sheet for Patients: EntrepreneurPulse.com.au  Fact Sheet for Healthcare Providers: IncredibleEmployment.be  This test is not yet approved or cleared by the [place] FDA and has been authorized for detection and/or diagnosis of SARS-CoV-2 by FDA under an Emergency Use Authorization (EUA). This EUA will remain in effect (meaning this test can be used) for the duration of the COVID-19 declaration under Section 564(b)(1) of the Act, 21 U.S.C. section 360bbb-3(b)(1), unless the authorization is terminated or revoked.  Performed at [facility], [place] [address], [place], [place] [address]   Surgical PCR screen     Status: None   Collection Time: 10/19/21  8:09 PM   Specimen: Nasal Mucosa; Nasal Swab  Result Value Ref Range Status   MRSA, PCR NEGATIVE NEGATIVE Final   Staphylococcus aureus NEGATIVE NEGATIVE Final    Comment: (NOTE) The Xpert SA Assay (FDA approved for NASAL specimens in patients 54 years of age and older), is one component of a comprehensive surveillance program. It is not intended to diagnose infection nor to guide or monitor treatment. Performed at [facility], [place] [address], [place], [place] [address]     Radiology Studies: No results found.    Elberta Lachapelle T. [place]  If 7PM-7AM, please contact night-coverage [email or web address] 10/20/2021,  12:39 PM

## 2021-10-20 NOTE — Anesthesia Postprocedure Evaluation (Signed)
Anesthesia Post Note  Patient: Jeff Blissett Sr.  Procedure(s) Performed: Left temporal craniotomy for tumor with Brain Lab (Left) APPLICATION OF CRANIAL NAVIGATION (Left)     Patient location during evaluation: PACU Anesthesia Type: General Level of consciousness: awake Pain management: pain level controlled Respiratory status: spontaneous breathing Cardiovascular status: stable Postop Assessment: no apparent nausea or vomiting Anesthetic complications: no   No notable events documented.  Last Vitals:  Vitals:   10/20/21 1630 10/20/21 1645  BP: 135/81   Pulse: 94   Resp: 17   Temp:  37 C  SpO2: 96%     Last Pain:  Vitals:   10/20/21 1645  TempSrc:   PainSc: 0-No pain    LLE Motor Response: Purposeful movement (10/20/21 1645)   RLE Motor Response: Purposeful movement (10/20/21 1645)        Lorenzo Arscott

## 2021-10-20 NOTE — Assessment & Plan Note (Addendum)
Seen encephalopathy above -improved with IV haldol-- will d/c and monitor-- seroquel QHS for now

## 2021-10-20 NOTE — Anesthesia Preprocedure Evaluation (Addendum)
Anesthesia Evaluation  Patient identified by MRN, date of birth, ID band Patient awake    Reviewed: Allergy & Precautions, NPO status , Patient's Chart, lab work & pertinent test results  Airway Mallampati: II  TM Distance: >3 FB     Dental   Pulmonary COPD,    breath sounds clear to auscultation       Cardiovascular hypertension, + CAD   Rhythm:Regular Rate:Normal     Neuro/Psych History noted Dr. Nyoka Cowden    GI/Hepatic Neg liver ROS,   Endo/Other  diabetes  Renal/GU      Musculoskeletal   Abdominal   Peds  Hematology   Anesthesia Other Findings   Reproductive/Obstetrics                            Anesthesia Physical Anesthesia Plan  ASA: 3  Anesthesia Plan: General   Post-op Pain Management:    Induction: Intravenous  PONV Risk Score and Plan: Treatment may vary due to age or medical condition and Ondansetron  Airway Management Planned: Oral ETT  Additional Equipment: Arterial line  Intra-op Plan:   Post-operative Plan: Extubation in OR  Informed Consent: I have reviewed the patients History and Physical, chart, labs and discussed the procedure including the risks, benefits and alternatives for the proposed anesthesia with the patient or authorized representative who has indicated his/her understanding and acceptance.     Dental advisory given  Plan Discussed with: Anesthesiologist, CRNA and Surgeon  Anesthesia Plan Comments:        Anesthesia Quick Evaluation

## 2021-10-20 NOTE — Progress Notes (Signed)
Left restless and agitated today.  Tolerating the Decadron reasonably well with some increased hyperglycemia.  He remains afebrile.  He has no evidence of respiratory distress.  His white count is normal.  Once again I discussed situation with the patient and his family.  He has a left-sided posterior temporal mass worrisome for glioblastoma.  There is significant local mass effect and the patient has significant symptoms of aphasia with confusion and aggressiveness.  I discussed the options once again.  The family is comfortable with moving forward with a left-sided craniotomy and resection of tumor.  They are aware that if this tumor is a glioblastoma that surgery itself will not be curative.

## 2021-10-20 NOTE — Anesthesia Procedure Notes (Signed)
Arterial Line Insertion Start/End2/21/2023 1:58 PM Performed by: Belinda Block, MD, CRNA  Preanesthetic checklist: patient identified, IV checked, site marked, risks and benefits discussed, surgical consent, monitors and equipment checked and pre-op evaluation Patient sedated Right, radial was placed Catheter size: 20 G Hand hygiene performed , maximum sterile barriers used  and Seldinger technique used Allen's test indicative of satisfactory collateral circulation Attempts: 1 Procedure performed without using ultrasound guided technique. Following insertion, Biopatch and dressing applied. Post procedure assessment: normal  Patient tolerated the procedure well with no immediate complications.

## 2021-10-20 NOTE — Op Note (Signed)
Date of procedure: 10/20/2021  Date of dictation: Same  Service: Neurosurgery  Preoperative diagnosis: Right posterior temporal lobe tumor  Postoperative diagnosis: Same  Procedure Name: Right craniotomy with resection of tumor utilizing intraoperative stereotactic guidance and microdissection  Surgeon:Shere Eisenhart A.Abriana Saltos, M.D.  Asst. Surgeon: Reinaldo Meeker, NP  Anesthesia: General  Indication: 66 year old male with new onset aphasia and personality change.  Work-up demonstrates evidence of an enhancing lesion in his left posterior temporal lobe worrisome for glioblastoma.  Patient presents now for surgical resection.  Operative note: After induction anesthesia, patient position supine with his head turned toward the right and fixed in place with a [place] pin headrest.  Patient's scalp was then registered into the BrainLab stereotactic computer and merged with his MRI and CT scan.  After confirmation of good reliability with the stereotactic probe the left frontotemporoparietal region was prepped and draped sterilely.  A curvilinear incision was then made overlying the ear and the flap was reflected inferiorly and held in place with clamps.  A left-sided frontotemporoparietal craniotomy was then performed using high-speed drill.  Bone flap was elevated.  Stereotactic guidance was used to confirm good placement of the craniotomy and good approach to the tumor.  The brain was obviously swollen beneath the dura.  The dura was opened and also flapped inferiorly.  Stereotaxis was used to guide a small cortical incision overlying the tumor.  This was performed inferior to a large draining vein.  After a brief cortical dissection the tumor became readily apparent.  The tumor itself was dark purple and very friable.  Specimens were taken initially for frozen section but this was abandoned as the tumor was very friable and there was some continuous bleeding and gross total resection was achieved in order to fully  address the bleeding from the tumor wall.  Specimen was passed off for final pathology analysis.  Stereotactic probe confirmed good volumetric resection.  Bleeding was obtained with Surgifoam and bipolar cautery.  As this was eloquent tissue all efforts were made to minimize retraction or further damage to this area of the brain.  The cortical incision was irrigated clear.  There is no evidence of any further hemorrhage.  The resection site was lined with Surgicel.  The dura was reapproximated with 4-0 Vicryl sutures and DuraGen was placed over the craniotomy defect.  The craniotomy flap was reapproximated using plates and screws.  Scalp was reapproximated using 2-0 Vicryl suture at the g[place]a and staples at the surface.  There were no apparent complications.  Patient tolerated the procedure well and he returns to the recovery room postop.

## 2021-10-21 ENCOUNTER — Encounter (HOSPITAL_COMMUNITY): Payer: Self-pay | Admitting: Neurosurgery

## 2021-10-21 ENCOUNTER — Inpatient Hospital Stay (HOSPITAL_COMMUNITY): Payer: Medicare Other

## 2021-10-21 LAB — RENAL FUNCTION PANEL
Albumin: 3.2 g/dL — ABNORMAL LOW (ref 3.5–5.0)
Anion gap: 13 (ref 5–15)
BUN: 11 mg/dL (ref 8–23)
CO2: 22 mmol/L (ref 22–32)
Calcium: 8.7 mg/dL — ABNORMAL LOW (ref 8.9–10.3)
Chloride: 104 mmol/L (ref 98–111)
Creatinine, Ser: 0.95 mg/dL (ref 0.61–1.24)
GFR, Estimated: >60 mL/min (ref >60)
Glucose, Bld: 245 mg/dL — ABNORMAL HIGH (ref 70–99)
Phosphorus: 3.6 mg/dL (ref 2.5–4.6)
Potassium: 3.7 mmol/L (ref 3.5–5.1)
Sodium: 139 mmol/L (ref 135–145)

## 2021-10-21 LAB — CBC
HCT: 38.5 % — ABNORMAL LOW (ref 39.0–52.0)
Hemoglobin: 12.8 g/dL — ABNORMAL LOW (ref 13.0–17.0)
MCH: 31.9 pg (ref 26.0–34.0)
MCHC: 33.2 g/dL (ref 30.0–36.0)
MCV: 96 fL (ref 80.0–100.0)
Platelets: 146 10*3/uL — ABNORMAL LOW (ref 150–400)
RBC: 4.01 MIL/uL — ABNORMAL LOW (ref 4.22–5.81)
RDW: 13.3 % (ref 11.5–15.5)
WBC: 10.2 10*3/uL (ref 4.0–10.5)
nRBC: 0 % (ref 0.0–0.2)

## 2021-10-21 LAB — GLUCOSE, CAPILLARY
Glucose-Capillary: 228 mg/dL — ABNORMAL HIGH (ref 70–99)
Glucose-Capillary: 269 mg/dL — ABNORMAL HIGH (ref 70–99)
Glucose-Capillary: 240 mg/dL — ABNORMAL HIGH (ref 70–99)
Glucose-Capillary: 250 mg/dL — ABNORMAL HIGH (ref 70–99)
Glucose-Capillary: 250 mg/dL — ABNORMAL HIGH (ref 70–99)
Glucose-Capillary: 226 mg/dL — ABNORMAL HIGH (ref 70–99)

## 2021-10-21 LAB — MAGNESIUM: Magnesium: 1.9 mg/dL (ref 1.7–2.4)

## 2021-10-21 IMAGING — CT CT HEAD W/O CM
3 of 4 series · 15 of 47 positions shown, 18 images · non-contrast
Comparison: Brain MRI [DATE]

CLINICAL DATA: Brain neoplasm status post resection



[Series 2: head 5.0 h30s · axial · 0.39mm/px · z∈[-164,-4]mm · 9 of 38 slices shown, 12 images]
[im 3/38  brain]
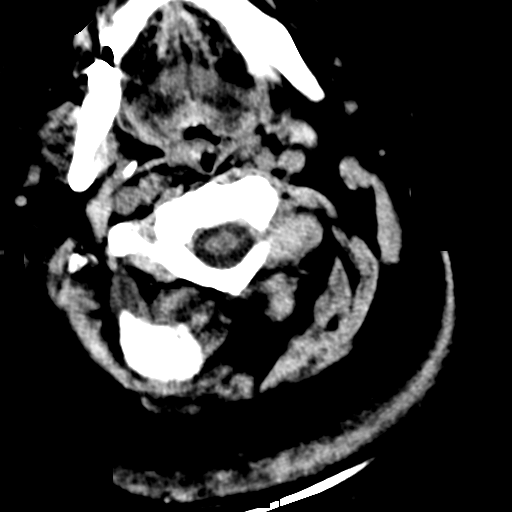
[im 3/38  bone]
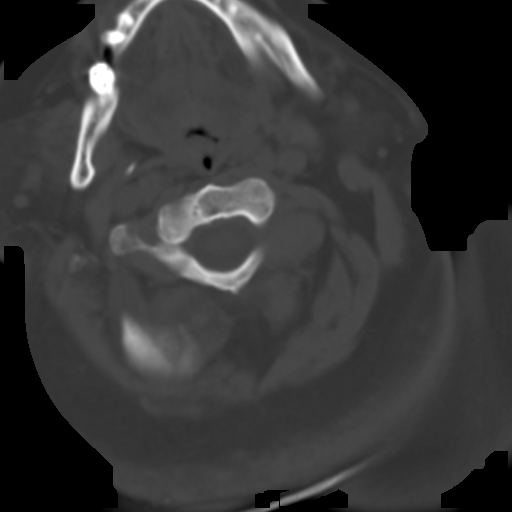
[im 8/38  brain]
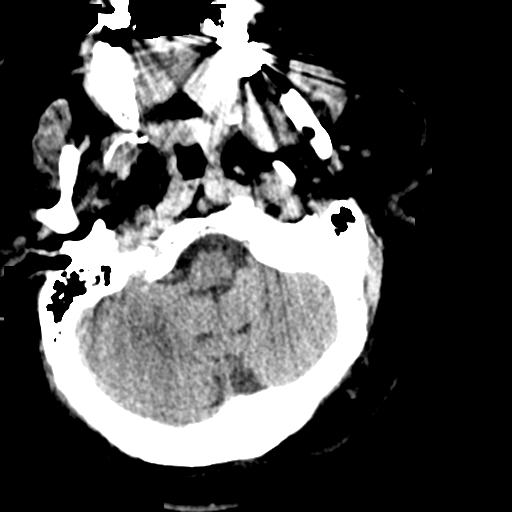
[im 11/38  brain]
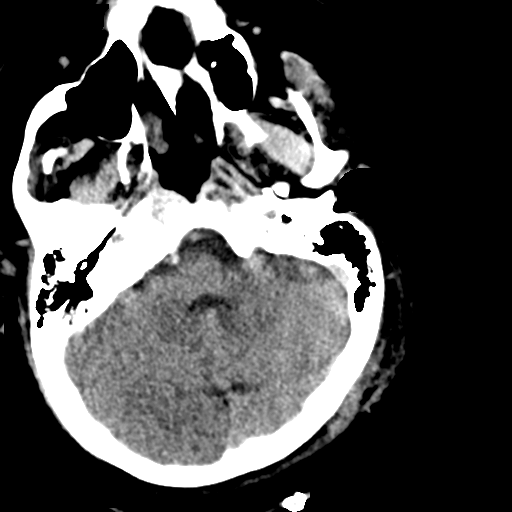
[im 16/38  brain]
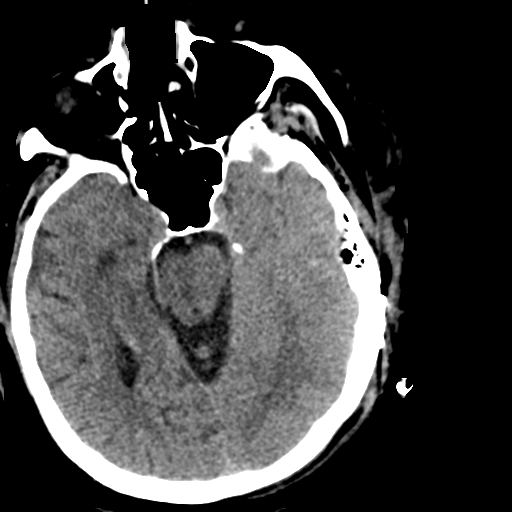
[im 19/38  brain]
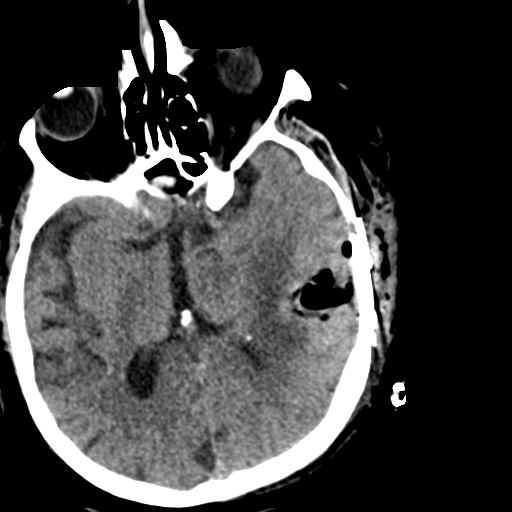
[im 19/38  bone]
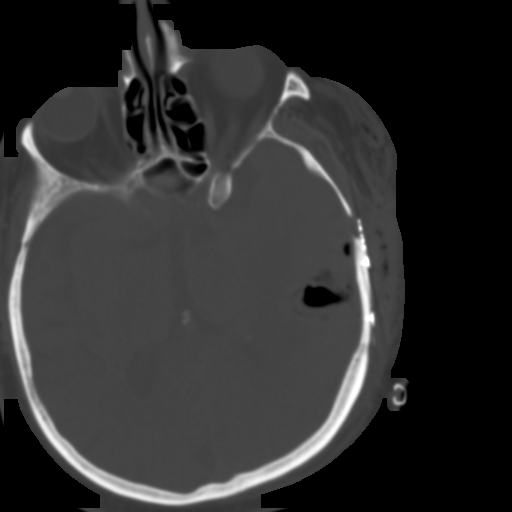
[im 22/38  brain]
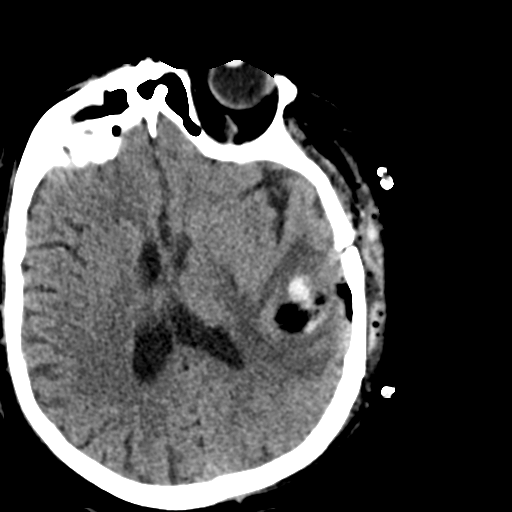
[im 27/38  brain]
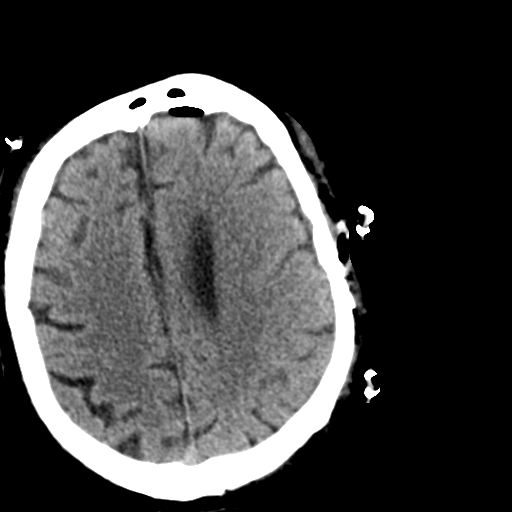
[im 30/38  brain]
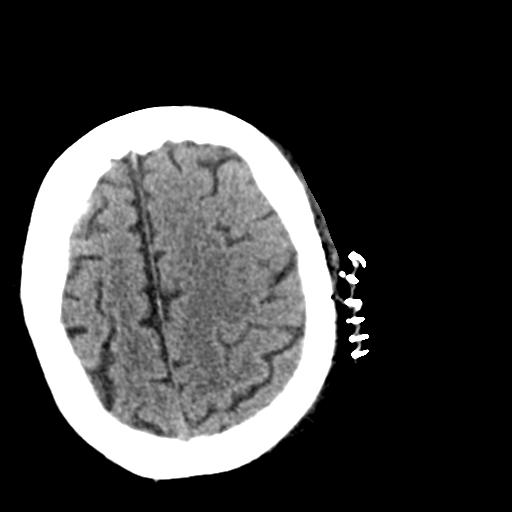
[im 35/38  brain]
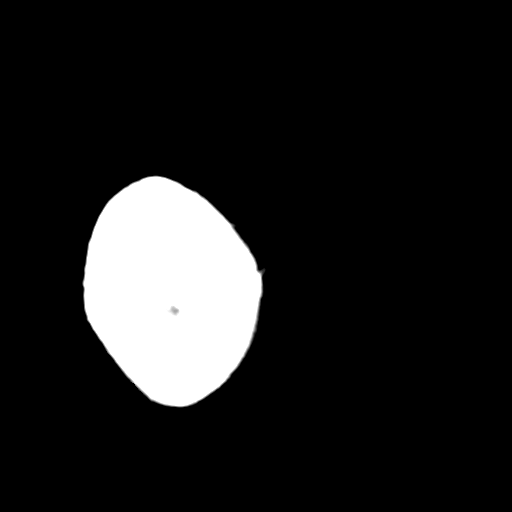
[im 35/38  bone]
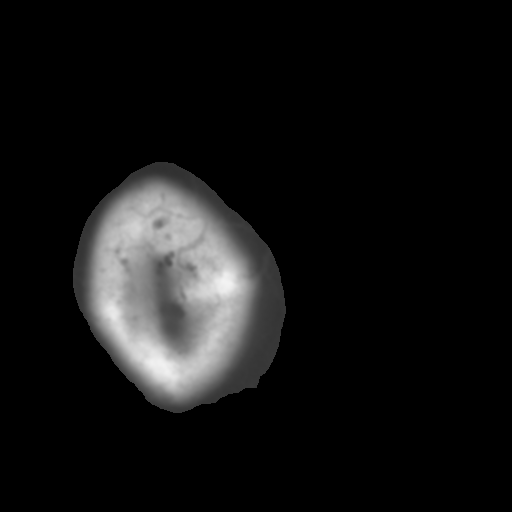

[Series 4: head 3.0 mpr cor · coronal · 0.42mm/px · 3 of 78 slices shown]
[im 26/78  brain]
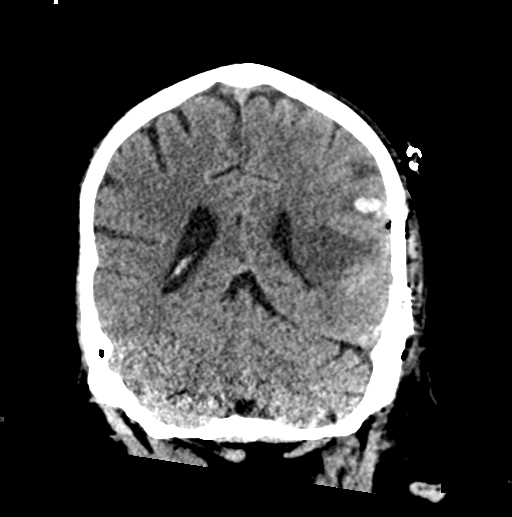
[im 35/78  brain]
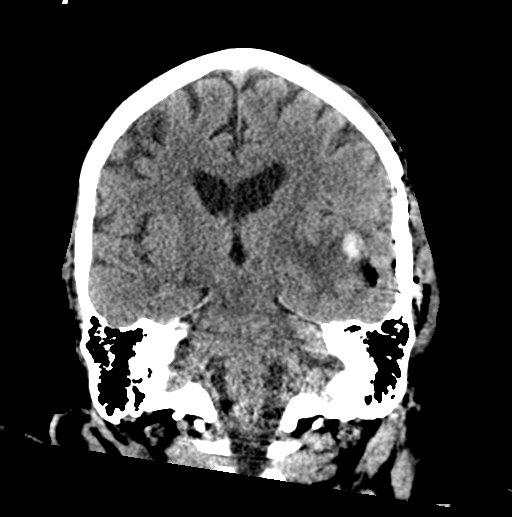
[im 43/78  brain]
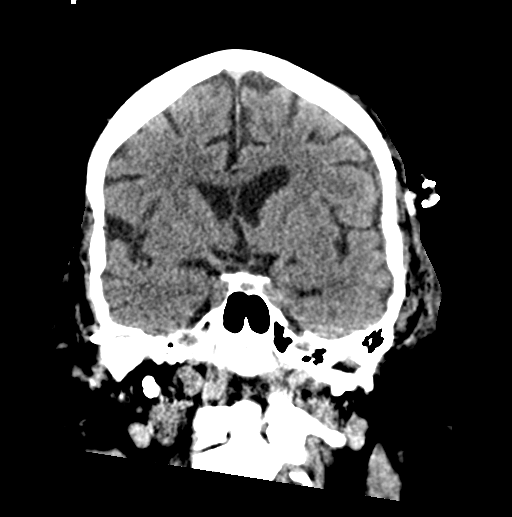

[Series 5: head 3.0 mpr sag · sagittal · 0.41mm/px · 3 of 62 slices shown]
[im 25/62  brain]
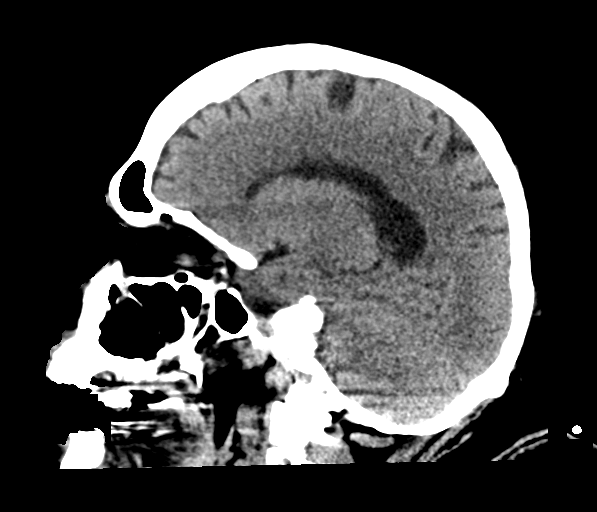
[im 31/62  brain]
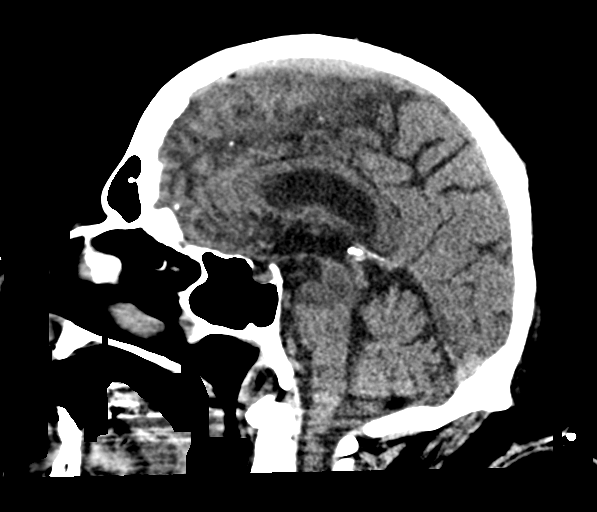
[im 37/62  brain]
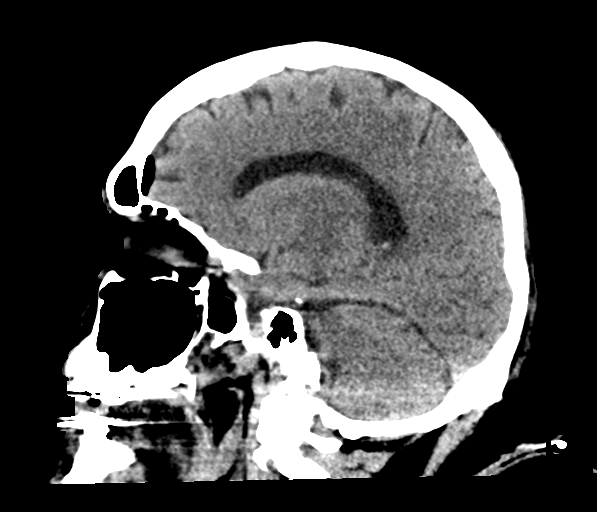

[15 of 47 positions shown; findings below may reference images not displayed]

FINDINGS: Brain: There has been interval left temporoparietal craniotomy for
mass resection. The resection cavity in the temporal lobe measures
approximately 3.9 cm TV x 1.7 cm AP x 2.1 cm cc.

There is a small amount of blood products at the periphery of the
cavity. An additional focus of intraparenchymal or subarachnoid
blood is seen superior to the resection cavity measuring 1.5 cm by
1.1 cm. There is a small amount of extra-axial fluid, blood, and air
overlying the resection site measuring up to approximately 3 mm in
thickness in the coronal plane. A small amount of pneumocephalus is
also seen overlying the left frontal lobe.

There is mild edema surrounding the resection cavity and this
additional focus of blood resulting in partial effacement of the
left temporal horn and approximally 2 mm rightward midline shift.

The remainder of the brain parenchyma is within normal limits. There
is no evidence of acute territorial infarct.

Vascular: No hyperdense vessel or unexpected calcification.

Skull: Postsurgical changes as above.

Sinuses/Orbits: The paranasal sinuses are clear. The globes and
orbits are unremarkable.

Other: There is expected postoperative soft tissue swelling with
fluid and gas in the left scalp.
IMPRESSION: 1. Postsurgical changes reflecting interval left temporal lobe mass
resection with a small amount of blood products at the periphery of
the cavity as well as intraparenchymal versus subarachnoid
hemorrhage superior to the resection cavity. Edema around the
resection cavity and blood products results in partial effacement of
the left temporal horn and trace rightward midline shift.
2. No evidence of acute territorial infarct.

## 2021-10-21 MED ORDER — DEXAMETHASONE SODIUM PHOSPHATE 4 MG/ML IJ SOLN
2.0000 mg | Freq: Four times a day (QID) | INTRAMUSCULAR | Status: DC
  Administered 2021-10-21 – 2021-10-24 (×11): 2 mg via INTRAVENOUS
  Filled 2021-10-21 (×11): qty 1

## 2021-10-21 MED ORDER — HALOPERIDOL LACTATE 5 MG/ML IJ SOLN
2.0000 mg | Freq: Four times a day (QID) | INTRAMUSCULAR | Status: DC | PRN
  Administered 2021-10-21 – 2021-10-22 (×2): 2 mg via INTRAVENOUS
  Filled 2021-10-21 (×2): qty 1

## 2021-10-21 MED ORDER — POLYETHYLENE GLYCOL 3350 17 G PO PACK
17.0000 g | PACK | Freq: Two times a day (BID) | ORAL | Status: DC
  Administered 2021-10-21 – 2021-10-27 (×6): 17 g via ORAL
  Filled 2021-10-21 (×8): qty 1

## 2021-10-21 MED ORDER — INSULIN ASPART 100 UNIT/ML IJ SOLN
0.0000 [IU] | INTRAMUSCULAR | Status: DC
  Administered 2021-10-21: 5 [IU] via SUBCUTANEOUS
  Administered 2021-10-22: 8 [IU] via SUBCUTANEOUS
  Administered 2021-10-22: 5 [IU] via SUBCUTANEOUS
  Administered 2021-10-22 (×2): 8 [IU] via SUBCUTANEOUS
  Administered 2021-10-22: 11 [IU] via SUBCUTANEOUS
  Administered 2021-10-22 (×2): 5 [IU] via SUBCUTANEOUS
  Administered 2021-10-23: 8 [IU] via SUBCUTANEOUS
  Administered 2021-10-23: 3 [IU] via SUBCUTANEOUS
  Administered 2021-10-23: 5 [IU] via SUBCUTANEOUS
  Administered 2021-10-23 (×2): 8 [IU] via SUBCUTANEOUS
  Administered 2021-10-24: 11 [IU] via SUBCUTANEOUS
  Administered 2021-10-24 (×2): 8 [IU] via SUBCUTANEOUS
  Administered 2021-10-24: 3 [IU] via SUBCUTANEOUS
  Administered 2021-10-24: 5 [IU] via SUBCUTANEOUS
  Administered 2021-10-24: 3 [IU] via SUBCUTANEOUS
  Administered 2021-10-24 – 2021-10-25 (×2): 5 [IU] via SUBCUTANEOUS
  Administered 2021-10-25: 15 [IU] via SUBCUTANEOUS
  Administered 2021-10-25: 5 [IU] via SUBCUTANEOUS

## 2021-10-21 MED ORDER — HYDROMORPHONE HCL 1 MG/ML IJ SOLN
0.5000 mg | INTRAMUSCULAR | Status: DC | PRN
  Administered 2021-10-21 – 2021-10-26 (×6): 0.5 mg via INTRAVENOUS
  Filled 2021-10-21 (×2): qty 1
  Filled 2021-10-21 (×2): qty 0.5
  Filled 2021-10-21 (×2): qty 1

## 2021-10-21 MED ORDER — HALOPERIDOL LACTATE 5 MG/ML IJ SOLN
1.0000 mg | Freq: Four times a day (QID) | INTRAMUSCULAR | Status: DC | PRN
  Administered 2021-10-21: 1 mg via INTRAVENOUS
  Filled 2021-10-21: qty 1

## 2021-10-21 MED ORDER — HALOPERIDOL LACTATE 5 MG/ML IJ SOLN
1.0000 mg | Freq: Once | INTRAMUSCULAR | Status: AC
  Administered 2021-10-21: 1 mg via INTRAVENOUS
  Filled 2021-10-21: qty 1

## 2021-10-21 MED ORDER — BISACODYL 10 MG RE SUPP: 10.0000 mg | Freq: Every day | RECTAL | Status: DC | PRN

## 2021-10-21 MED FILL — Thrombin For Soln 5000 Unit: CUTANEOUS | Qty: 5000 | Status: AC

## 2021-10-21 NOTE — Evaluation (Signed)
Occupational Therapy Re-Evaluation Patient Details Name: Jeff Reece Sr. MRN: [id number] DOB: 09/21/1955 Today's Date: 10/21/2021   History of Present Illness 66 year old male with acute onset of aphasia and confusion. MRI scan demonstrates evidence of a enhancing mass in his right posterior temporal region worrisome for primary neoplasm (most likely glioblastoma per NSU). Now s/p R craniotomy on 10/20/21 Patient's medical history includes type 2 diabetes mellitus, coronary artery disease and a remote history of DVT still being treated on Eliquis and Plavix.   Clinical Impression   Pt seen s/p crani for tumor resection. Per nsg pt agitated earlier and in 5 pt restraints. Familiar with famili from previous visit. Pt able to mobilize to EOB then to BSC<> chair with mod A +2 @ HHA level. Pt with apparent communication deficits however cooperative with therapists. Lethargic throughout session, falling asleep while sitting on BSC. Pt attempted grooming tasks once seated, however impaired due to level of arousal and deficits listed below. VSS on RA. At this time recommend rehab at [facility] to facilitate safe DC home with family. Daughter states she can provide assistance after DC as parents live with her.Acute OT to follow.      Recommendations for follow up therapy are one component of a multi-disciplinary discharge planning process, led by the attending physician.  Recommendations may be updated based on patient status, additional functional criteria and insurance authorization.   Follow Up Recommendations  Acute inpatient rehab (3hours/day)    Assistance Recommended at Discharge    Patient can return home with the following A lot of help with walking and/or transfers;A lot of help with bathing/dressing/bathroom;Direct supervision/assist for medications management;Direct supervision/assist for financial management;Assist for transportation;Help with stairs or ramp for entrance;Assistance with  cooking/housework    Functional Status Assessment  Patient has had a recent decline in their functional status and demonstrates the ability to make significant improvements in function in a reasonable and predictable amount of time.  Equipment Recommendations       Recommendations for Other Services Other (comment)     Precautions / Restrictions Precautions Precautions: Fall Precaution Comments: restraints, occasional agitation vs lethargy      Mobility Bed Mobility Overal bed mobility: Needs Assistance Bed Mobility: Supine to Sit     Supine to sit: Mod assist, +2 for physical assistance          Transfers Overall transfer level: Needs assistance Equipment used: 2 person hand held assist Transfers: Sit to/from Stand, Bed to chair/wheelchair/BSC Sit to Stand: Mod assist, +2 safety/equipment     Step pivot transfers: Min assist, +2 safety/equipment     General transfer comment: assist with bilateral HHA and some lifting help to stand, stepping to [facility] first with multimodal cues, then to recliner      Balance Overall balance assessment: Needs assistance Sitting-balance support: Feet supported Sitting balance-Leahy Scale: Fair Sitting balance - Comments: sitting EOB no LOB, but close S to minguard due to lethargy   Standing balance support: Bilateral upper extremity supported, Reliant on assistive device for balance Standing balance-Leahy Scale: Poor                             ADL either performed or assessed with clinical judgement   ADL       Grooming: Maximal assistance   Upper Body Bathing: Maximal assistance   Lower Body Bathing: Total assistance   Upper Body Dressing : Total assistance   Lower Body Dressing: Total assistance  Toilet Transfer: Moderate assistance;+2 for physical assistance;BSC/3in1;Stand-pivot   Toileting- Clothing Manipulation and Hygiene: Total assistance Toileting - Clothing Manipulation Details (indicate cue type  and reason): attempted to have a BM; states he needed to pee; encouraged use of condom cath     Functional mobility during ADLs: +2 for physical assistance;Moderate assistance       Vision Baseline Vision/History: 1 Wears glasses Patient Visual Report:  (will assess)       Perception Perception Comments: will assess   Praxis Praxis Praxis-Other Comments: following commands with increased times; not using objects appropriate however possibly due to level of arousal; will assess    Pertinent Vitals/Pain Pain Assessment Pain Assessment: Faces Faces Pain Scale: Hurts a little bit Pain Location: generalized Pain Descriptors / Indicators: Discomfort, Grimacing Pain Intervention(s): Limited activity within patient's tolerance     Hand Dominance Right   Extremity/Trunk Assessment Upper Extremity Assessment Upper Extremity Assessment: Generalized weakness;Difficult to assess due to impaired cognition (hx of frozen shoulders; moving BUE, Able to wash face using R hand)   Lower Extremity Assessment Lower Extremity Assessment: Defer to PT evaluation   Cervical / Trunk Assessment Cervical / Trunk Assessment: Kyphotic;Other exceptions (increased body habitus)   Communication Communication Communication: Expressive difficulties (receptive?)   Cognition Arousal/Alertness: Lethargic, Suspect due to medications (anti nausea/pain/Haldol for CT) Behavior During Therapy: Flat affect, Restless Overall Cognitive Status: Difficult to assess                                 General Comments: Pt impulsive at times however able to redirect; lethargic adn momemts where pt was falling asleep during session; will further assess     General Comments  VSS    Exercises     Shoulder Instructions      Home Living Family/patient expects to be discharged to:: Inpatient rehab Living Arrangements: Spouse/significant other Available Help at Discharge: Family Type of Home:  House Home Access: Stairs to enter [employer] of Steps: 2 Entrance Stairs-Rails: None Home Layout: One level     Bathroom Shower/Tub: Teacher, early years/pre: Handicapped height Bathroom Accessibility: No   Home Equipment: Shower seat;Grab bars - toilet;Grab bars - tub/shower;Cane - single point          Prior Functioning/Environment Prior Level of Function : Independent/Modified Independent;Needs assist  Cognitive Assist : ADLs (cognitive)   ADLs (Cognitive): Intermittent cues         ADLs Comments: wife assists wtih medicaiton management; wears 2.5 L at home when sleeping/napping; has support dog down stairs; enjoys doing christain word search        OT Problem List: Decreased strength;Decreased range of motion;Decreased activity tolerance;Impaired balance (sitting and/or standing);Decreased coordination;Decreased cognition;Decreased safety awareness;Decreased knowledge of use of DME or AE;Obesity;Pain      OT Treatment/Interventions: Self-care/ADL training;Therapeutic exercise;Neuromuscular education;DME and/or AE instruction;Therapeutic activities;Cognitive remediation/compensation;Visual/perceptual remediation/compensation;Patient/family education;Balance training    OT Goals(Current goals can be found in the care plan section) Acute Rehab OT Goals Patient Stated Goal: to get better per family OT Goal Formulation: With family Time For Goal Achievement: 11/04/21  OT Frequency: Min 2X/week    Co-evaluation PT/OT/SLP Co-Evaluation/Treatment: Yes Reason for Co-Treatment: Complexity of the patient's impairments (multi-system involvement);Necessary to address cognition/behavior during functional activity;For patient/therapist safety;To address functional/ADL transfers   OT goals addressed during session: ADL's and self-care      AM-PAC OT "6 Clicks" Daily Activity     Outcome Measure  Help from another person eating meals?: Total Help from  another person taking care of personal grooming?: A Lot Help from another person toileting, which includes using toliet, bedpan, or urinal?: Total Help from another person bathing (including washing, rinsing, drying)?: A Lot Help from another person to put on and taking off regular upper body clothing?: Total Help from another person to put on and taking off regular lower body clothing?: Total 6 Click Score: 8   End of Session Equipment Utilized During Treatment: Gait belt Nurse Communication: Mobility status  Activity Tolerance: Patient limited by lethargy Patient left: in chair;with call bell/phone within reach;with chair alarm set;with restraints reapplied;with family/visitor present  OT Visit Diagnosis: Unsteadiness on feet (R26.81);Other abnormalities of gait and mobility (R26.89);Muscle weakness (generalized) (M62.81);Other symptoms and signs involving cognitive function;Pain Pain - part of body:  (head)                Time: 2542-7062 OT Time Calculation (min): 38 min Charges:  OT General Charges $OT Visit: 1 Visit OT Evaluation $OT Re-eval: 1 Re-eval OT Treatments $Self Care/Home Management : 8-22 mins  Maurie Boettcher, OT/L   Acute OT Clinical Specialist [place] Pager [phone] Office [phone]   [facility] 10/21/2021, 2:31 PM

## 2021-10-21 NOTE — Progress Notes (Signed)
Inpatient Diabetes Program Recommendations  AACE/ADA: New Consensus Statement on Inpatient Glycemic Control  Target Ranges:  Prepandial:   less than 140 mg/dL      Peak postprandial:   less than 180 mg/dL (1-2 hours)      Critically ill patients:  140 - 180 mg/dL    Latest Reference Range & Units 10/21/21 04:59 10/21/21 07:20 10/21/21 11:43  Glucose-Capillary 70 - 99 mg/dL 228 (H) 269 (H) 240 (H)  (H): Data is abnormally high  Latest Reference Range & Units 10/20/21 11:56 10/20/21 14:25 10/20/21 18:13 10/20/21 19:53 10/20/21 23:42  Glucose-Capillary 70 - 99 mg/dL 273 (H) 209 (H) 243 (H) 223 (H) 284 (H)   Review of Glycemic Control  Diabetes history: DM2 Outpatient Diabetes medications: Glipizide XL 5 mg BID Current orders for Inpatient glycemic control: Semglee 15 units BID, Novolog 0-20 units TID with meals, Novolog 0-5 units QHS, Novolog 6 units TID with meals; Decadron 4 mg Q6H   Inpatient Diabetes Program Recommendations:     Insulin: If steroids are continued as ordered, please consider increasing Semglee to 20 units BID  Thanks, Barnie Alderman, RN, MSN, CDE Diabetes Coordinator Inpatient Diabetes Program [phone] (Team Pager from 8am to 5pm)
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
## 2021-10-21 NOTE — Progress Notes (Signed)
Postop day 1.  Patient situation stable overnight.  He is wide awake.  He remains demanding with staff.  His overall level of agitation however appears improved from preop.  Afebrile.  Vital signs are stable.  Urine output good.  He is awake and aware.  He is oriented to person but not place or situation or time.  His speech continues to exhibit elements of fluent aphasia with some anomia.  Wound is clean and dry.  Cranial nerve function appears intact.  Motor 5/5 bilaterally.  Follow-up imaging not performed yet.  Patient unlikely to cooperate with MRI scan.  He has a significant shellfish allergy and I think we will just get a plain head CT scan to ensure that the resection was in the right place and that there is been no complicating features.    Okay to begin mobilizing today.

## 2021-10-21 NOTE — Evaluation (Addendum)
Clinical/Bedside Swallow Evaluation Patient Details  Name: Jeff Maciolek Sr. MRN: [id number] Date of Birth: 06/19/1956  Today's Date: 10/21/2021 Time: SLP Start Time (ACUTE ONLY): 0917 SLP Stop Time (ACUTE ONLY): 2841 SLP Time Calculation (min) (ACUTE ONLY): 20 min  Past Medical History:  Past Medical History:  Diagnosis Date   Morbid obesity ([place]) 10/17/2021   Past Surgical History:  Past Surgical History:  Procedure Laterality Date   RADIOLOGY WITH ANESTHESIA N/A 10/18/2021   Procedure: MRI WITH ANESTHESIA;  Surgeon: Radiologist, Medication, MD;  Location: [facility];  Service: Radiology;  Laterality: N/A;   HPI:  Pt is a 66 y.o. male who presented to the ED for evaluation of acute onset aphasia. MRI brain 2/17,2/19: Mass involving the posterior left temporal lobe with associated edema. Neurosurgery concerned for glioblastoma and pt s/p cranioromy and resection  2/21. Incidental finding of COVID-19. Pt passed [facility] 2/17. PMH: CAD on Plavix, DVT on Eliquis, hypertension, hyperlipidemia, type II diabetes.    Assessment / Plan / Recommendation  Clinical Impression  Pt was seen for bedside swallow evaluation which was limited due to pt's cooperation. Oral mechanism exam was not conducted due to pt's difficulty following commands, or his unwillingness to do, but some natural dentition was noted during speech. He tolerated puree solids and thin liquids via straw without signs or symptoms of oropharyngeal dysphagia. However, he refused all additional boluses despite support from SLP and RN. A full liquid diet will be initiated at this time and SLP will follow pt for advancement as clinically indicated. SLP Visit Diagnosis: Dysphagia, unspecified (R13.10)    Aspiration Risk  Mild aspiration risk    Diet Recommendation  (Full liquids)   Liquid Administration via: Cup;Straw Medication Administration:  (crushed/whole in puree) Supervision: Staff to assist with self feeding Compensations: Slow  rate;Small sips/bites Postural Changes: Seated upright at 90 degrees    Other  Recommendations Oral Care Recommendations: Oral care BID    Recommendations for follow up therapy are one component of a multi-disciplinary discharge planning process, led by the attending physician.  Recommendations may be updated based on patient status, additional functional criteria and insurance authorization.  Follow up Recommendations  (TBD)      Assistance Recommended at Discharge    Functional Status Assessment    Frequency and Duration min 2x/week  2 weeks       Prognosis Prognosis for Safe Diet Advancement: Good Barriers to Reach Goals: Language deficits      Swallow Study   General Date of Onset: 10/20/21 HPI: Pt is a 66 y.o. male who presented to the ED for evaluation of acute onset aphasia. MRI brain 2/17,2/19: Mass involving the posterior left temporal lobe with associated edema. Neurosurgery concerned for glioblastoma and pt s/p cranioromy and resection  2/21. Incidental finding of COVID-19. Pt passed [facility] 2/17. PMH: CAD on Plavix, DVT on Eliquis, hypertension, hyperlipidemia, type II diabetes. Type of Study: Bedside Swallow Evaluation Previous Swallow Assessment: none Diet Prior to this Study: NPO Temperature Spikes Noted: No Respiratory Status: Room air History of Recent Intubation: Yes Length of Intubations (days):  (for surgery) Date extubated: 10/20/21 Behavior/Cognition: Alert;Agitated;Uncooperative;Doesn't follow directions;Requires cueing;Distractible Oral Cavity Assessment: Within Functional Limits Oral Care Completed by SLP: No Oral Cavity - Dentition:  (difficult to assess d/t cooperation, but some dentition noted.) Vision: Functional for self-feeding Self-Feeding Abilities: Able to feed self Patient Positioning: Upright in bed;Postural control adequate for testing Baseline Vocal Quality: Normal Volitional Cough: Cognitively unable to elicit Volitional Swallow: Able to  elicit    Oral/Motor/Sensory Function Overall Oral Motor/Sensory Function: Within functional limits   Ice Chips Ice chips: Within functional limits   Thin Liquid Thin Liquid: Within functional limits Presentation: Straw    Nectar Thick Nectar Thick Liquid: Not tested   Honey Thick Honey Thick Liquid: Not tested   Puree Puree: Within functional limits Presentation: Spoon   Solid    March Steyer I. Hardin Negus, [place], [place] Office number [phone] Pager [phone]  Solid: Not tested (refused)      Horton Marshall 10/21/2021,9:57 AM

## 2021-10-21 NOTE — Progress Notes (Signed)
When I went in to patient's room to give 1200 meds patient's wife apologized to me about what she said earlier. (See previous note from me)  I explained to her that I understand her situation is stressful and I am glad we can move on and work together. Agricultural consultant and AD notified of this.  Montez Hageman RN

## 2021-10-21 NOTE — Evaluation (Addendum)
Physical Therapy Re-Evaluation Patient Details Name: Jeff Ancheta Sr. MRN: [id number] DOB: 12/14/1955 Today's Date: 10/21/2021  History of Present Illness  66 year old male with acute onset of aphasia and confusion. MRI scan demonstrates evidence of a enhancing mass in his right posterior temporal region worrisome for primary neoplasm (most likely glioblastoma per NSU). Plan is for tumor resection. Patient's medical history includes type 2 diabetes mellitus, coronary artery disease and a remote history of DVT still being treated on Eliquis and Plavix.  Now s/p R craniotomy on 10/20/21.  Clinical Impression  Patient continues with lethargy but at times agitated per RN.  Family in the room and supportive.  Currently +2 A for safety up OOB to [facility] then to recliner.  Patient able to take steps but lethargic and needing cues to maintain level of arousal.  Patient spoke few spontaneous words, but would only follow commands intermittently and did not state his name when asked.  Patient appropriate for acute inpatient rehab prior to d/c home.  PT will continue to follow acutely.        Recommendations for follow up therapy are one component of a multi-disciplinary discharge planning process, led by the attending physician.  Recommendations may be updated based on patient status, additional functional criteria and insurance authorization.  Follow Up Recommendations Acute inpatient rehab (3hours/day)    Assistance Recommended at Discharge Frequent or constant Supervision/Assistance  Patient can return home with the following  Two people to help with walking and/or transfers;Two people to help with bathing/dressing/bathroom;Assistance with cooking/housework;Direct supervision/assist for medications management;Help with stairs or ramp for entrance    Equipment Recommendations Rolling walker (2 wheels)  Recommendations for Other Services       Functional Status Assessment       Precautions /  Restrictions Precautions Precautions: Fall Precaution Comments: restraints, occasional agitation vs lethargy Restrictions Weight Bearing Restrictions: No      Mobility  Bed Mobility Overal bed mobility: Needs Assistance Bed Mobility: Supine to Sit     Supine to sit: +2 for safety/equipment, HOB elevated, Mod assist     General bed mobility comments: assist for lines, pt pulling up on PT, OT guiding legs off bed    Transfers Overall transfer level: Needs assistance Equipment used: 2 person hand held assist Transfers: Sit to/from Stand, Bed to chair/wheelchair/BSC Sit to Stand: Mod assist, +2 safety/equipment   Step pivot transfers: Min assist, +2 safety/equipment       General transfer comment: assist with bilateral HHA and some lifting help to stand, stepping to [facility] first with multimodal cues, then to recliner    Ambulation/Gait Ambulation/Gait assistance: Min assist, +2 safety/equipment Gait Distance (Feet): 2 Feet Assistive device: 2 person hand held assist Gait Pattern/deviations: Step-to pattern, Decreased stride length, Wide base of support, Shuffle       General Gait Details: up to stand from [facility] then took steps forward then turning to back to recliner with mod cues and +2 A for safety; pt reliant on bilat HHA  Stairs            Wheelchair Mobility    Modified Rankin (Stroke Patients Only)       Balance Overall balance assessment: Needs assistance Sitting-balance support: Feet supported Sitting balance-Leahy Scale: Fair Sitting balance - Comments: sitting EOB no LOB, but close S to minguard due to lethargy   Standing balance support: Bilateral upper extremity supported, Reliant on assistive device for balance Standing balance-Leahy Scale: Poor  Pertinent Vitals/Pain Pain Assessment Breathing: normal Negative Vocalization: none Facial Expression: sad, frightened, frown Body Language:  relaxed Consolability: no need to console PAINAD Score: 1    Home Living Family/patient expects to be discharged to:: Inpatient rehab Living Arrangements: Spouse/significant other Available Help at Discharge: Family Type of Home: House Home Access: Stairs to enter Entrance Stairs-Rails: None Entrance Stairs-Number of Steps: 2   Home Layout: One level Home Equipment: Shower seat;Grab bars - toilet;Grab bars - tub/shower;Cane - single point      Prior Function Prior Level of Function : Independent/Modified Independent;Needs assist  Cognitive Assist : ADLs (cognitive)   ADLs (Cognitive): Intermittent cues         ADLs Comments: wife assists wtih medicaiton management; wears 2.5 L at home when sleeping/napping; has support dog down stairs; enjoys doing christain word search     Hand Dominance   Dominant Hand: Right    Extremity/Trunk Assessment   Upper Extremity Assessment Upper Extremity Assessment: Defer to OT evaluation    Lower Extremity Assessment Lower Extremity Assessment: Generalized weakness    Cervical / Trunk Assessment Cervical / Trunk Assessment: Kyphotic  Communication   Communication: Expressive difficulties  Cognition Arousal/Alertness: Lethargic Behavior During Therapy: Restless Overall Cognitive Status: Difficult to assess Area of Impairment: Following commands                       Following Commands: Follows one step commands inconsistently                General Comments General comments (skin integrity, edema, etc.): vitals stable with sitting and moving to BSC/chair, family in the room and supportive, appreciative of help for pt OOB    Exercises     Assessment/Plan    PT Assessment Patient needs continued PT services  PT Problem List Decreased strength;Decreased mobility;Decreased safety awareness;Decreased activity tolerance;Decreased cognition;Decreased balance;Decreased knowledge of precautions       PT Treatment  Interventions DME instruction;Therapeutic activities;Cognitive remediation;Patient/family education;Therapeutic exercise;Gait training;Balance training;Stair training;Functional mobility training    PT Goals (Current goals can be found in the Care Plan section)  Acute Rehab PT Goals Patient Stated Goal: family agreeable to CIR PT Goal Formulation: With family Time For Goal Achievement: 11/02/21 Potential to Achieve Goals: Fair    Frequency Min 4X/week     Co-evaluation PT/OT/SLP Co-Evaluation/Treatment: Yes Reason for Co-Treatment: Complexity of the patient's impairments (multi-system involvement);Necessary to address cognition/behavior during functional activity;For patient/therapist safety;To address functional/ADL transfers PT goals addressed during session: Mobility/safety with mobility;Balance OT goals addressed during session: ADL's and self-care       AM-PAC PT "6 Clicks" Mobility  Outcome Measure Help needed turning from your back to your side while in a flat bed without using bedrails?: A Lot Help needed moving from lying on your back to sitting on the side of a flat bed without using bedrails?: A Lot Help needed moving to and from a bed to a chair (including a wheelchair)?: A Lot Help needed standing up from a chair using your arms (e.g., wheelchair or bedside chair)?: A Lot Help needed to walk in hospital room?: Total Help needed climbing 3-5 steps with a railing? : Total 6 Click Score: 10    End of Session Equipment Utilized During Treatment: Gait belt Activity Tolerance: Patient limited by lethargy Patient left: in chair;with chair alarm set;with call bell/phone within reach Nurse Communication: Mobility status PT Visit Diagnosis: Other abnormalities of gait and mobility (R26.89);Other symptoms and signs involving the nervous  system (R29.898);Muscle weakness (generalized) (M62.81)    Time: 2500-3704 PT Time Calculation (min) (ACUTE ONLY): 30 min   Charges:   PT  Evaluation $PT Re-evaluation: 1 Re-eval          Magda Kiel, PT Acute Rehabilitation Services UGQBV:[phone] Office:[phone] 10/21/2021   Reginia Naas 10/21/2021, 11:11 AM

## 2021-10-21 NOTE — Progress Notes (Signed)
Patient thrashing around in bed and yelling at this RN saying "Bitch let me out". PRN haldol and dilaudid given by [facility] RN. Ankle restraints added. Vital signs stable.   Montez Hageman RN

## 2021-10-21 NOTE — Progress Notes (Addendum)
Progress Note   Patient: Jeff Wood Sr. [id number]:[id number] DOB: 01/29/1956 DOA: 10/15/2021     5 DOS: the patient was seen and examined on 10/21/2021   Brief hospital course: 66 year old M with PMH of CAD/remote stent on Plavix, chronic RF on 2 L, COPD, chronic pain on opiate, extensive DVT on Eliquis, DM-2, HTN, HLD and morbid obesity presenting with acute onset confusion and aphasia with concern for CVA.  Reportedly took 2 of the 200 mg trazodone and he is the pain medication prior to arrival.  UDS negative.  He tested positive for COVID-19 but symptomatic for over 2 weeks.   CT head concerning for 3 to 4 cm region of subacute infarction versus mass in left posterior temporal lobe.  Neurology consulted.  He was started on Keppra 500 mg twice daily. Spot and LTM EEG without seizure or epileptiform discharge.    CT angio head and neck and CT cerebral perfusion with contrast without LVO but showed 2.5 cm hyperemic mass in left posterior temporal lobe.   MRI brain without contrast also showed mass lesion of the left temporal lobe with associated edema but truncated exam due to patient's inability to tolerate setting of mental status change.  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    Neurosurgery consulted and started Decadron.  Plan for left temporal craniotomy and resection of tumor on 2/21.  Neurology, neurosurgery neurology and neurosurgery following.  Neurology and neurooncology, Dr. Mickeal Skinner recommended CT chest/abdomen/pelvis to exclude for possible primary elsewhere-appears cancelled-- will defer to Dr. Mickeal Skinner if he wants to re-order   Assessment and Plan: * Acute toxic metabolic encephalopathy with aphasia with new left temporal mass- (present on admission) Acute onset.  Does not seem to have other focal  neuro deficit but limited exam due to confusion.  -No seizure activity since admission.   -Spot and LTM EEG negative for seizure.  -UDS, EtOH and TSH negative.  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 Annette Stable to get plan for length of treatment -Craniotomy and resection of tumor on 2/21. -CT chest/abdomen/pelvis not done, unclear why- ? If because he is not cooperative -Neuro oncology, Dr. Mickeal Skinner consulted as well   Hypokalemia Resolved.  COPD with chronic hypoxic respiratory failure On 2.5 L by [place] at baseline.  Stable. -Continue home Trelegy Ellipta -DuoNeb as needed  Hyperlipidemia - Atorvastatin as above.  Morbid obesity ([place]) Estimated body mass index is 37.86 kg/m as calculated from the following:   Height as of this encounter: 5\' 10"  (1.778 m).   Weight as of this encounter: 119.7 kg.  Sinus bradycardia- (present on admission) Likely due to beta-blocker.  He was on metoprolol 100 mg twice daily at home -Beta-blocker as above. -Continue telemetry monitoring  Lab test positive for detection of COVID-19 virus- (present on admission) Previous attending discussed with lab (CT value 38.1).  Per  wife, patient has received initial 2 doses of COVID-vaccine and 1 booster shot.  He started having cough and congestion 2 weeks ago.  Chronically uses 2.5 L supplemental oxygen and no change in oxygen requirement from baseline.  CXR without acute finding.   Inflammatory markers within normal. -Continue airborne precaution per hospital policy  Diabetes mellitus type 2, noninsulin dependent ([place]) Uncontrolled NIDDM-2 with hyperglycemia and other complications: F8H 8.2%.  Seems he is only on glipizide at home.  Hyperglycemia likely due to steroid. -Continue SSI-steroid scale. -Increase basal insulin from 15 units daily to 15 units twice daily -NovoLog 6 units 3 times daily with meals if he eats> 50% of his meals -Continue home Lipitor -He would benefit from GLP-1 agonist given morbid obesity and cardiac history- defer to PCP.   Essential hypertension Stable. -Cardiac meds as above.  History of DVT (deep vein thrombosis) -Hold Eliquis  CAD S/P percutaneous coronary angioplasty No anginal symptoms. -Continue IV metoprolol 5 mg every 6 hours while NPO. -Continue holding Hyzaar. -Continue holding torsemide.  Per wife, takes Lasix due to dehydration with torsemide.  -Hold Plavix and Eliquis pending surgical intervention for brain tumor      Subjective: will say a few words but is impulsive with movement/trying to get up   Physical Exam: Vitals:   10/21/21 1100 10/21/21 1200 10/21/21 1300 10/21/21 1400  BP: (!) 155/61 (!) 148/95 (!) 147/77 (!) 155/88  Pulse: 63 71 (!) 59 74  Resp: 16 18 18  (!) 21  Temp:  98.3 F (36.8 C)    TempSrc:  Oral    SpO2: 95% 96% 95% 96%  Weight:      Height:        General: Appearance:    Obese male in no acute distress     Lungs:     respirations unlabored  Heart:    Normal heart rate.    MS:   All extremities are intact.    Neurologic:   Confused, intermittently following commands, impulsive     Data Reviewed: Glucose elevated at 245  Family Communication: wife/daughter at bedside  Disposition: Status is: Inpatient Remains inpatient appropriate because: CIR? Once medically stable          Planned Discharge Destination:  tbd  Time spent: 85 minutes  Author: Geradine Girt,  DO 10/21/2021 5:32 PM  For on call review [email or web address].

## 2021-10-21 NOTE — Progress Notes (Addendum)
Inpatient Rehabilitation Admissions Coordinator   Inpatient rehab consult./prescreen received. Noted COVID + 10/15/21. Patients are eligible to be considered for admit to the [facility] when cleared from airborne precautions by acute MD or Infectious disease regardless of onset day.  Please call me with any questions.    Danne Baxter, RN, MSN Rehab Admissions Coordinator [phone] 10/22/2021 8:34 AM

## 2021-10-21 NOTE — Progress Notes (Signed)
SLP Cancellation Note  Patient Details Name: Jeff Rumbold Sr. MRN: [id number] DOB: [birth year]   Cancelled treatment:       Reason Eval/Treat Not Completed: Patient's level of consciousness (Pt given dilaudid due to agitation and was unable to demonstrate an adequate level of alertness for participation in speech-language evaluation. SLP will follow up on subsequent date.)  Katherene Dinino I. Hardin Negus, [place], [place] Office number [phone] Pager [place] 10/21/2021, 9:52 AM

## 2021-10-21 NOTE — Progress Notes (Signed)
Walked in on patient ripping off all leads, gown, one IV, and took off his crani dressing entirely and was picking at the staples in his head. Patient not redirectable. Patient profusely bleeding from surgical site where the blood clot on the dressing had been. Pressure immediately applied and patient stopped actively bleeding within 3 minutes. Patient immediately given IV haldol and arms were re-restrained. Patient cleaned up and wife notified of patient being re-restrained. I had a 30 minute conversation with her explaining why we had to restrain the patient again and our number one priority is his safety. Especially if he is endangering himself by ripping off dressings and trying to pull out staples. Wife verbalized understanding and his appreciative of the nursing staff wanting the patient to be safe. Vital signs stable. Will continue to closely monitor patient.   Montez Hageman RN

## 2021-10-22 ENCOUNTER — Encounter (HOSPITAL_COMMUNITY): Payer: Self-pay | Admitting: Internal Medicine

## 2021-10-22 LAB — GLUCOSE, CAPILLARY
Glucose-Capillary: 230 mg/dL — ABNORMAL HIGH (ref 70–99)
Glucose-Capillary: 241 mg/dL — ABNORMAL HIGH (ref 70–99)
Glucose-Capillary: 251 mg/dL — ABNORMAL HIGH (ref 70–99)
Glucose-Capillary: 292 mg/dL — ABNORMAL HIGH (ref 70–99)
Glucose-Capillary: 292 mg/dL — ABNORMAL HIGH (ref 70–99)
Glucose-Capillary: 307 mg/dL — ABNORMAL HIGH (ref 70–99)

## 2021-10-22 MED ORDER — HALOPERIDOL LACTATE 5 MG/ML IJ SOLN
5.0000 mg | Freq: Four times a day (QID) | INTRAMUSCULAR | Status: DC
  Administered 2021-10-22 – 2021-10-24 (×8): 5 mg via INTRAVENOUS
  Filled 2021-10-22 (×8): qty 1

## 2021-10-22 MED ORDER — BUDESONIDE 0.25 MG/2ML IN SUSP
0.2500 mg | Freq: Two times a day (BID) | RESPIRATORY_TRACT | Status: DC
  Administered 2021-10-23 – 2021-10-27 (×8): 0.25 mg via RESPIRATORY_TRACT
  Filled 2021-10-22 (×11): qty 2

## 2021-10-22 NOTE — Progress Notes (Signed)
Progress Note   Patient: Jeff Solly Sr. [id number]:[id number] DOB: 03/07/1956 DOA: 10/15/2021     6 DOS: the patient was seen and examined on 10/22/2021   Brief hospital course: 66 year old M with PMH of CAD/remote stent on Plavix, chronic RF on 2 L, COPD, chronic pain on opiate, extensive DVT on Eliquis, DM-2, HTN, HLD and morbid obesity presenting with acute onset confusion and aphasia with concern for CVA.  Reportedly took 2 of the 200 mg trazodone and he is the pain medication prior to arrival.  UDS negative.  He tested positive for COVID-19 but symptomatic for over 2 weeks.   CT head concerning for 3 to 4 cm region of subacute infarction versus mass in left posterior temporal lobe.  Neurology consulted.  He was started on Keppra 500 mg twice daily. Spot and LTM EEG without seizure or epileptiform discharge.    CT angio head and neck and CT cerebral perfusion with contrast without LVO but showed 2.5 cm hyperemic mass in left posterior temporal lobe.   MRI brain without contrast also showed mass lesion of the left temporal lobe with associated edema but truncated exam due to patient's inability to tolerate setting of mental status change.  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    Neurosurgery consulted and started Decadron.  Plan for left temporal craniotomy and resection of tumor on 2/21.  Neurology, neurosurgery neurology and neurosurgery following.  Neurology and neurooncology, Dr. Mickeal Skinner recommended CT chest/abdomen/pelvis to exclude for possible primary elsewhere - appears cancelled due to allergy...will defer to Dr. Mickeal Skinner to order if needed   Assessment and Plan: * Acute toxic metabolic encephalopathy with aphasia with new left temporal mass- (present on admission) Acute onset.  Does not seem to have  other focal neuro deficit but limited exam due to confusion.  -No seizure activity since admission.   -Spot and LTM EEG negative for seizure.  -UDS, EtOH and TSH negative.  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 -CT chest/abdomen/pelvis not done---Neuro oncology, Dr. Mickeal Skinner consulted as well   Hypokalemia Resolved.  COPD with chronic hypoxic respiratory failure On 2.5 L by [place] at baseline.  Stable. -Continue home Trelegy Ellipta -DuoNeb as needed  Hyperlipidemia - Atorvastatin as above.  Morbid obesity ([place]) Estimated body mass index is 37.86 kg/m as calculated from the following:   Height as of this encounter: 5\' 10"  (1.778 m).   Weight as of this encounter: 119.7 kg.  Sinus bradycardia- (present on admission) Likely due to beta-blocker.  He was on metoprolol 100 mg twice daily at home -Beta-blocker as above- once taking PO then can change back to PO -Continue telemetry monitoring  Lab test positive for detection of COVID-19 virus- (present on admission) Previous attending discussed with lab (  CT value 38.1).  Per wife, patient has received initial 2 doses of COVID-vaccine and 1 booster shot.  He started having cough and congestion 2 weeks ago.  Chronically uses 2.5 L supplemental oxygen and no change in oxygen  requirement from baseline.  CXR without acute finding.  Inflammatory markers within normal. -Continue airborne precaution per hospital policy x 10 days  Diabetes mellitus type 2, noninsulin dependent ([place]) Uncontrolled NIDDM-2 with hyperglycemia and other complications: Y3F 3.8%.  Seems he is only on glipizide at home.  Hyperglycemia likely due to steroid. -Continue SSI-steroid scale. -Increase basal insulin from 15 units daily to 15 units twice daily    Essential hypertension Stable. -Cardiac meds as above.  History of DVT (deep vein thrombosis) -Hold Eliquis  CAD S/P percutaneous coronary angioplasty No anginal symptoms. -Continue IV metoprolol 5 mg every 6 hours while NPO. -Continue holding Hyzaar. -Continue holding torsemide.  Per wife, takes Lasix due to dehydration with torsemide.  -Hold Plavix and Eliquis       Subjective: in bed, calm until awoken and then able to say he needed to go to the bathroom but had BM in bed prior to being gotten up  Physical Exam: Vitals:   10/22/21 0600 10/22/21 0800 10/22/21 1000 10/22/21 1200  BP: (!) 162/102 (!) 148/110 109/61 (!) 146/73  Pulse: 81 (!) 134 (!) 121 98  Resp: 14 (!) 30 (!) 21 (!) 26  Temp:  98.6 F (37 C)  98.7 F (37.1 C)  TempSrc:  Oral  Oral  SpO2: (!) 87% 98% 97% 96%  Weight:      Height:        General: Appearance:    Obese male easily frustrated with communication      Lungs:     respirations unlabored  Heart:    Tachycardic.   MS:   All extremities are intact. Moves all 4 ext   Neurologic:   Awake- intermittently following commands and answering questions     Data Reviewed: Labs ordered for the AM  Family Communication: at bedside  Disposition: Status is: Inpatient Remains inpatient appropriate because: needs CIR- and improvement of delirium          Planned Discharge Destination: Rehab  Time spent: 95 minutes  Author: Geradine Girt, DO 10/22/2021 3:01 PM  For on call review  [email or web address].

## 2021-10-22 NOTE — Progress Notes (Signed)
Patient found out of restraints, ripped out IV, and dressing on crani site. Needed 3 RN's to re-restrain patient. New dressing applied to site. Patient still acutely agitated and confused. Will continue to monitor.  Montez Hageman RN

## 2021-10-22 NOTE — Progress Notes (Signed)
Patient refusing all PO meds and food by nursing staff. Patient will accept food when the daughter gives it to him. She is feeding him breakfast now.  Montez Hageman, RN

## 2021-10-22 NOTE — Progress Notes (Signed)
Inpatient Diabetes Program Recommendations  AACE/ADA: New Consensus Statement on Inpatient Glycemic Control (2015)  Target Ranges:  Prepandial:   less than 140 mg/dL      Peak postprandial:   less than 180 mg/dL (1-2 hours)      Critically ill patients:  140 - 180 mg/dL   Lab Results  Component Value Date   GLUCAP 251 (H) 10/22/2021   HGBA1C 9.5 (H) 10/16/2021    Review of Glycemic Control  Latest Reference Range & Units 10/21/21 19:54 10/21/21 23:09 10/22/21 04:04 10/22/21 08:05  Glucose-Capillary 70 - 99 mg/dL 250 (H) 226 (H) 230 (H) 251 (H)  (H): Data is abnormally high  Diabetes history: DM2 Outpatient Diabetes medications: Glipizide XL 5 mg BID Current orders for Inpatient glycemic control: Semglee 15 units BID, Novolog 0-20 units TID with meals, Novolog 0-5 units QHS, Novolog 6 units TID with meals; Decadron 2 mg Q6H   Inpatient Diabetes Program Recommendations:     Insulin: If steroids are continued as ordered, consider increasing Semglee to 20 units BID  Thanks, Bronson Curb, MSN, RNC-OB Diabetes Coordinator [phone] (8a-5p)

## 2021-10-22 NOTE — Progress Notes (Addendum)
Physical Therapy Treatment Patient Details Name: Jeff Wood. MRN: [id number] DOB: June 08, 1956 Today's Date: 10/22/2021   History of Present Illness 66 year old male with acute onset of aphasia and confusion. 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 Now s/p L craniotomy on 10/20/21 Patient's medical history includes type 2 diabetes mellitus, coronary artery disease and a remote history of DVT still being treated on Eliquis and Plavix.    PT Comments    Patient still lethargic this session with scheduled Haldol due to agitation.  He was still eager to mobilize, though quickly fatigued and slow to respond.  He was able to mobility up into standing and with +2 A side step toward HOB.  Patient with VSS throughout and wife present and inquisitive.  He is appropriate for acute inpatient rehab.  PT will continue to follow.    Recommendations for follow up therapy are one component of a multi-disciplinary discharge planning process, led by the attending physician.  Recommendations may be updated based on patient status, additional functional criteria and insurance authorization.  Follow Up Recommendations  Acute inpatient rehab (3hours/day)     Assistance Recommended at Discharge Frequent or constant Supervision/Assistance  Patient can return home with the following Two people to help with walking and/or transfers;Two people to help with bathing/dressing/bathroom;Assistance with cooking/housework;Direct supervision/assist for medications management;Help with stairs or ramp for entrance   Equipment Recommendations  Rolling walker (2 wheels)    Recommendations for Other Services       Precautions / Restrictions Precautions Precautions: Fall Precaution Comments: restraints, occasional agitation vs lethargy     Mobility  Bed Mobility Overal bed mobility: Needs Assistance Bed  Mobility: Rolling, Sidelying to Sit, Sit to Supine Rolling: Mod assist Sidelying to sit: Mod assist, HOB elevated   Sit to supine: Max assist, +2 for physical assistance   General bed mobility comments: cues and increased time with assist to roll and for side to sit with assist for legs and trunk, to supine assist for legs and trunk and positioning    Transfers Overall transfer level: Needs assistance Equipment used: 2 person hand held assist Transfers: Sit to/from Stand Sit to Stand: Mod assist, +2 physical assistance, From elevated surface                Ambulation/Gait Ambulation/Gait assistance: Mod assist, +2 physical assistance Gait Distance (Feet): 2 Feet Assistive device: 2 person hand held assist Gait Pattern/deviations: Step-to pattern, Decreased stride length       General Gait Details: side steps toward [facility] with assist for lateral weight shifts and mod to max cues for pt to perform stepping   Stairs             Wheelchair Mobility    Modified Rankin (Stroke Patients Only)       Balance Overall balance assessment: Needs assistance Sitting-balance support: Feet supported Sitting balance-Leahy Scale: Poor Sitting balance - Comments: minguard for balance due to lethargy and attention   Standing balance support: Bilateral upper extremity supported Standing balance-Leahy Scale: Poor Standing balance comment: UE support for balance                            Cognition Arousal/Alertness: Lethargic, Suspect due to medications Behavior During Therapy: Restless, Flat affect Overall Cognitive Status: Difficult to assess Area of Impairment: Attention, Following commands  Current Attention Level: Focused   Following Commands: Follows one step commands inconsistently, Follows one step commands with increased time                Exercises      General Comments General comments (skin integrity, edema, etc.):  wife in the room asking lots of questions and continuously reporting off events of the day and states plans to stay overnight; VSS with mobility and pt with smear on bed pad so changed pad while pt standing and assisted for hygiene      Pertinent Vitals/Pain Pain Assessment Pain Assessment: Faces Faces Pain Scale: Hurts little more Pain Location: R knee with movement Pain Descriptors / Indicators: Grimacing, Guarding, Moaning Pain Intervention(s): Monitored during session, Limited activity within patient's tolerance, Repositioned    Home Living                          Prior Function            PT Goals (current goals can now be found in the care plan section) Progress towards PT goals: Progressing toward goals (slowly)    Frequency    Min 4X/week      PT Plan Current plan remains appropriate    Co-evaluation              AM-PAC PT "6 Clicks" Mobility   Outcome Measure  Help needed turning from your back to your side while in a flat bed without using bedrails?: A Lot Help needed moving from lying on your back to sitting on the side of a flat bed without using bedrails?: A Lot Help needed moving to and from a bed to a chair (including a wheelchair)?: A Lot Help needed standing up from a chair using your arms (e.g., wheelchair or bedside chair)?: A Lot Help needed to walk in hospital room?: Total Help needed climbing 3-5 steps with a railing? : Total 6 Click Score: 10    End of Session   Activity Tolerance: Patient limited by lethargy Patient left: in bed;with call bell/phone within reach;with family/visitor present;with restraints reapplied   PT Visit Diagnosis: Other abnormalities of gait and mobility (R26.89);Other symptoms and signs involving the nervous system (R29.898);Muscle weakness (generalized) (M62.81)     Time: 7564-3329 PT Time Calculation (min) (ACUTE ONLY): 31 min  Charges:  $Therapeutic Activity: 23-37 mins                      Magda Kiel, PT Acute Rehabilitation Services JJOAC:[phone] Office:[phone] 10/22/2021    Reginia Naas 10/22/2021, 4:09 PM

## 2021-10-22 NOTE — Progress Notes (Signed)
Providing Compassionate, Quality Care - Together   Subjective: Nurse and wife report patient still very agitated. Wife and daughter at the bedside. Nurse reports Haldol now scheduled per primary team.  Objective: Vital signs in last 24 hours: Temp:  [97.6 F (36.4 C)-98.6 F (37 C)] 98.6 F (37 C) (02/23 0800) Pulse Rate:  [59-134] 121 (02/23 1000) Resp:  [13-30] 21 (02/23 1000) BP: (109-195)/(61-151) 109/61 (02/23 1000) SpO2:  [87 %-100 %] 97 % (02/23 1000)  Intake/Output from previous day: 02/22 0701 - 02/23 0700 In: 1560.9 [I.V.:1460.9; IV Piggyback:100] Out: 650 [Urine:650] Intake/Output this shift: Total I/O In: 126.3 [IV Piggyback:126.3] Out: -   Alert, agitated Oriented to person Speech slurred, intermittent aphasia MAE, strength intact Incision covered with gauze; dressing is clean, dr, and intact  Lab Results: Recent Labs    10/20/21 1815 10/21/21 0607  WBC 7.6 10.2  HGB 12.5* 12.8*  HCT 38.9* 38.5*  PLT 199 146*   BMET Recent Labs    10/20/21 1815 10/21/21 0607  NA 138 139  K 3.5 3.7  CL 104 104  CO2 20* 22  GLUCOSE 241* 245*  BUN 14 11  CREATININE 0.98 0.95  CALCIUM 8.5* 8.7*    Studies/Results: CT HEAD WO CONTRAST (5MM)  Result Date: 10/21/2021 CLINICAL DATA:  Brain neoplasm status post resection EXAM: CT HEAD WITHOUT CONTRAST TECHNIQUE: Contiguous axial images were obtained from the base of the skull through the vertex without intravenous contrast. RADIATION DOSE REDUCTION: This exam was performed according to the departmental dose-optimization program which includes automated exposure control, adjustment of the mA and/or kV according to patient size and/or use of iterative reconstruction technique. COMPARISON:  Brain MRI 10/18/2021 FINDINGS: Brain: There has been interval left temporoparietal craniotomy for mass resection. The resection cavity in the temporal lobe measures approximately 3.9 cm TV x 1.7 cm AP x 2.1 cm cc. There is a small  amount of blood products at the periphery of the cavity. An additional focus of intraparenchymal or subarachnoid blood is seen superior to the resection cavity measuring 1.5 cm by 1.1 cm. There is a small amount of extra-axial fluid, blood, and air overlying the resection site measuring up to approximately 3 mm in thickness in the coronal plane. A small amount of pneumocephalus is also seen overlying the left frontal lobe. There is mild edema surrounding the resection cavity and this additional focus of blood resulting in partial effacement of the left temporal horn and approximally 2 mm rightward midline shift. The remainder of the brain parenchyma is within normal limits. There is no evidence of acute territorial infarct. Vascular: No hyperdense vessel or unexpected calcification. Skull: Postsurgical changes as above. Sinuses/Orbits: The paranasal sinuses are clear. The globes and orbits are unremarkable. Other: There is expected postoperative soft tissue swelling with fluid and gas in the left scalp. IMPRESSION: 1. Postsurgical changes reflecting interval left temporal lobe mass resection with a small amount of blood products at the periphery of the cavity as well as intraparenchymal versus subarachnoid hemorrhage superior to the resection cavity. Edema around the resection cavity and blood products results in partial effacement of the left temporal horn and trace rightward midline shift. 2. No evidence of acute territorial infarct. Electronically Signed   By: Valetta Mole M.D.   On: 10/21/2021 08:46    Assessment/Plan: Patient underwent right craniotomy for resection of tumor by Dr. Annette Stable on 10/20/2021. His neuro exam is slightly improved compared to prior to surgery.   LOS: 6 days   -  Awaiting pathology -Continue to work with therapies -No new Neurosurgical recommendations at this time   Viona Gilmore, DNP, AGNP-C Nurse Practitioner  [facility] [place]. [address],  [address], [place], [place] [address] P: [phone]     F: [phone]  10/22/2021, 12:03 PM

## 2021-10-22 NOTE — Progress Notes (Signed)
Speech Language Pathology Treatment: Dysphagia  Patient Details Name: Jeff Wood. MRN: [id number] DOB: [birth year] Today's Date: 10/22/2021 Time: 5374-8270 SLP Time Calculation (min) (ACUTE ONLY): 13 min  Assessment / Plan / Recommendation Clinical Impression  Pt was seen for dysphagia treatment with his wife and daughter present. Pt was alert and cooperation was notably improved compared to yesterday. Grapes were sent on his full liquid tray and pt's daughter reported that she has been tolerating them without difficulty. Pt was reclined and eating these grapes upon SLP's entry and he exhibited coughing with a grape which the pt's daughter reported was the largest that he has had. Per the pt's wife, pt does have a lower esophageal stricture which cannot be dilated and he does cough occasionally with solids/liquids at baseline. With repositioning, pt tolerated subsequent boluses of dysphagia 3, regular texture solids, and thin liquids via straw without overt s/sx of aspiration. Mastication time was mildly prolonged, and bolus awareness was intermittently reduced in the presence of increased distractions. Oral clearance was WNL. A dysphagia 3 diet with thin liquids is recommended at this time. SLP will continue to follow pt.     HPI HPI: Pt is a 66 y.o. male who presented to the ED for evaluation of acute onset aphasia. MRI brain 2/17,2/19: Mass involving the posterior left temporal lobe with associated edema. Neurosurgery concerned for glioblastoma and pt s/p cranioromy and resection  2/21. Incidental finding of COVID-19. Pt passed [facility] 2/17. PMH: CAD on Plavix, DVT on Eliquis, hypertension, hyperlipidemia, type II diabetes.      SLP Plan  Continue with current plan of care      Recommendations for follow up therapy are one component of a multi-disciplinary discharge planning process, led by the attending physician.  Recommendations may be updated based on patient status, additional  functional criteria and insurance authorization.    Recommendations  Diet recommendations: Dysphagia 3 (mechanical soft);Thin liquid Liquids provided via: Cup;Straw Medication Administration: Whole meds with puree Supervision: Staff to assist with self feeding Compensations: Slow rate;Small sips/bites;Minimize environmental distractions Postural Changes and/or Swallow Maneuvers: Seated upright 90 degrees                Oral Care Recommendations: Oral care BID Follow Up Recommendations:  (TBD) Assistance recommended at discharge: Frequent or constant Supervision/Assistance SLP Visit Diagnosis: Dysphagia, unspecified (R13.10) Plan: Continue with current plan of care          Diamonique Ruedas I. Hardin Negus, [place], [place] Office number [phone] Pager [place]  10/22/2021, 10:44 AM

## 2021-10-22 NOTE — Progress Notes (Signed)
New orders for scheduled haldol Q 6 hours. Just gave one dose and seemed to help settle patient. Will continue to monitor.    Montez Hageman RN

## 2021-10-22 NOTE — Progress Notes (Signed)
Patient in AFIB and HR between 90-140. Dr Eliseo Squires (hospitalist) notified. I also notified her of his increasing agitation and that I do not think he will be safe over on the progressive floor at this time due to me in the ICU hardly able to keep up with the patient's agitation. Requested possible precedex gtt if agitation continues. No new orders at this time, will continue to monitor patient.  Montez Hageman RN

## 2021-10-22 NOTE — Evaluation (Signed)
Speech Language Pathology Evaluation Patient Details Name: Jeff Lando Sr. MRN: [id number] DOB: 03-03-1956 Today's Date: 10/22/2021 Time: 9629-5284 SLP Time Calculation (min) (ACUTE ONLY): 16 min  Problem List:  Patient Active Problem List   Diagnosis Date Noted   Agitation 10/20/2021   Mass of left temporal lobe 10/19/2021   COPD with chronic hypoxic respiratory failure 10/18/2021   Hypokalemia 10/18/2021   Morbid obesity (Jeff Wood) 10/17/2021   Hyperlipidemia 10/17/2021   CAD S/P percutaneous coronary angioplasty 10/16/2021   History of DVT (deep vein thrombosis) 10/16/2021   Essential hypertension 10/16/2021   Diabetes mellitus type 2, noninsulin dependent ([name]) 10/16/2021   Lab test positive for detection of COVID-19 virus 10/16/2021   Sinus bradycardia 10/16/2021   Acute toxic metabolic encephalopathy with aphasia with new left temporal mass 10/16/2021   Past Medical History:  Past Medical History:  Diagnosis Date   Morbid obesity (Jeff Wood) 10/17/2021   Past Surgical History:  Past Surgical History:  Procedure Laterality Date   APPLICATION OF CRANIAL NAVIGATION Left 10/20/2021   Procedure: APPLICATION OF CRANIAL NAVIGATION;  Surgeon: Earnie Larsson, MD;  Location: [facility];  Service: Neurosurgery;  Laterality: Left;   CRANIOTOMY Left 10/20/2021   Procedure: Left temporal craniotomy for tumor with Brain Lab;  Surgeon: Earnie Larsson, MD;  Location: [facility];  Service: Neurosurgery;  Laterality: Left;   RADIOLOGY WITH ANESTHESIA N/A 10/18/2021   Procedure: MRI WITH ANESTHESIA;  Surgeon: Radiologist, Medication, MD;  Location: [facility];  Service: Radiology;  Laterality: N/A;   HPI:  Pt is a 66 y.o. male who presented to the ED for evaluation of acute onset aphasia. MRI brain 2/17,2/19: Mass involving the posterior left temporal lobe with associated edema. Neurosurgery concerned for glioblastoma and pt s/p cranioromy and resection  2/21. Incidental finding of COVID-19. Pt passed [name] 2/17. PMH: CAD  on Plavix, DVT on Eliquis, hypertension, hyperlipidemia, type II diabetes.   Assessment / Plan / Recommendation Clinical Impression  Pt was seen for speech-language evaluation with his wife and daughter present. Both parties denied the pt having any speech/language difficulty at baseline. However, they reported that he has been speaking "jibberish" and has only been able to produce up to 4-word utterances which are coherent. Pt presents with moderate aphasia characterized by impairments in receptive and expressive language. Receptively, pt was able to follow some 1-step commands, but he exhibited difficulty with auditory comprehension beyond this level and his response to even simple yes/no questions was unreliable. Pt communicated with single-word utterance and some short phrases, but neologistic errors and frequent word retrieval difficulty were often demonstrated. He was unable to complete automatic sequence tasks other structured naming tasks. However, difficulty was noted with attention, and the impact of this on his performance is considered. Skilled SLP services are clinically indicated at this time.    SLP Assessment  SLP Recommendation/Assessment: Patient needs continued Speech [place] [facility] SLP Visit Diagnosis: Aphasia (R47.01)    Recommendations for follow up therapy are one component of a multi-disciplinary discharge planning process, led by the attending physician.  Recommendations may be updated based on patient status, additional functional criteria and insurance authorization.    Follow Up Recommendations  Acute inpatient rehab (3hours/day)    Assistance Recommended at Discharge  Frequent or constant Supervision/Assistance  Functional Status Assessment Patient has had a recent decline in their functional status and demonstrates the ability to make significant improvements in function in a reasonable and predictable amount of time.  Frequency and Duration min 2x/week  2 weeks      SLP Evaluation Cognition  Overall Cognitive Status: Difficult to assess (due to aphasia) Arousal/Alertness: Awake/alert Orientation Level: Oriented to person Attention: Focused;Sustained Focused Attention: Impaired Focused Attention Impairment: Verbal complex Sustained Attention: Impaired Sustained Attention Impairment: Verbal basic       Comprehension  Auditory Comprehension Overall Auditory Comprehension: Impaired Yes/No Questions: Impaired Basic Immediate Environment Questions:  (2/5) Complex Questions:  (0/4) Commands: Impaired One Step Basic Commands:  (2/5) Two Step Basic Commands:  (0/3) Conversation: Simple    Expression Expression Primary Mode of Expression: Verbal Verbal Expression Overall Verbal Expression: Impaired Initiation: Impaired Automatic Speech: Counting;Day of week (Counting: 0/10; days: 0/7) Level of Generative/Spontaneous Verbalization: Phrase Repetition: Impaired Level of Impairment: Word level Naming: Impairment Confrontation:  (0/4) Verbal Errors: Neologisms   Oral / Motor  Motor Speech Overall Motor Speech: Appears within functional limits for tasks assessed Respiration: Within functional limits Phonation: Normal Resonance: Within functional limits Articulation: Within functional limitis Intelligibility: Intelligible Motor Planning: Witnin functional limits Motor Speech Errors: Not applicable           [name], [place], [place] Office number [phone] Pager [place] 10/22/2021, 10:58 AM

## 2021-10-23 LAB — BASIC METABOLIC PANEL
Anion gap: 11 (ref 5–15)
BUN: 18 mg/dL (ref 8–23)
CO2: 22 mmol/L (ref 22–32)
Calcium: 9 mg/dL (ref 8.9–10.3)
Chloride: 110 mmol/L (ref 98–111)
Creatinine, Ser: 0.9 mg/dL (ref 0.61–1.24)
GFR, Estimated: >60 mL/min (ref >60)
Glucose, Bld: 193 mg/dL — ABNORMAL HIGH (ref 70–99)
Potassium: 3.6 mmol/L (ref 3.5–5.1)
Sodium: 143 mmol/L (ref 135–145)

## 2021-10-23 LAB — HEPATIC FUNCTION PANEL
ALT: 46 U/L — ABNORMAL HIGH (ref 0–44)
AST: 52 U/L — ABNORMAL HIGH (ref 15–41)
Albumin: 3 g/dL — ABNORMAL LOW (ref 3.5–5.0)
Alkaline Phosphatase: 55 U/L (ref 38–126)
Bilirubin, Direct: 0.2 mg/dL (ref 0.0–0.2)
Indirect Bilirubin: 0.5 mg/dL (ref 0.3–0.9)
Total Bilirubin: 0.7 mg/dL (ref 0.3–1.2)
Total Protein: 6.5 g/dL (ref 6.5–8.1)

## 2021-10-23 LAB — GLUCOSE, CAPILLARY
Glucose-Capillary: 193 mg/dL — ABNORMAL HIGH (ref 70–99)
Glucose-Capillary: 220 mg/dL — ABNORMAL HIGH (ref 70–99)
Glucose-Capillary: 265 mg/dL — ABNORMAL HIGH (ref 70–99)
Glucose-Capillary: 270 mg/dL — ABNORMAL HIGH (ref 70–99)
Glucose-Capillary: 280 mg/dL — ABNORMAL HIGH (ref 70–99)
Glucose-Capillary: 264 mg/dL — ABNORMAL HIGH (ref 70–99)

## 2021-10-23 LAB — CBC
HCT: 40 % (ref 39.0–52.0)
Hemoglobin: 13.3 g/dL (ref 13.0–17.0)
MCH: 31.7 pg (ref 26.0–34.0)
MCHC: 33.3 g/dL (ref 30.0–36.0)
MCV: 95.2 fL (ref 80.0–100.0)
Platelets: 198 10*3/uL (ref 150–400)
RBC: 4.2 MIL/uL — ABNORMAL LOW (ref 4.22–5.81)
RDW: 13.4 % (ref 11.5–15.5)
WBC: 7 10*3/uL (ref 4.0–10.5)
nRBC: 0 % (ref 0.0–0.2)

## 2021-10-23 MED ORDER — METOPROLOL TARTRATE 5 MG/5ML IV SOLN
5.0000 mg | Freq: Four times a day (QID) | INTRAVENOUS | Status: DC | PRN
  Administered 2021-10-23 – 2021-10-24 (×2): 5 mg via INTRAVENOUS
  Filled 2021-10-23 (×2): qty 5

## 2021-10-23 MED ORDER — INSULIN GLARGINE-YFGN 100 UNIT/ML ~~LOC~~ SOLN
20.0000 [IU] | Freq: Two times a day (BID) | SUBCUTANEOUS | Status: DC
  Administered 2021-10-23 – 2021-10-25 (×4): 20 [IU] via SUBCUTANEOUS
  Filled 2021-10-23 (×5): qty 0.2

## 2021-10-23 MED ORDER — METOPROLOL TARTRATE 50 MG PO TABS
100.0000 mg | ORAL_TABLET | Freq: Two times a day (BID) | ORAL | Status: DC
  Administered 2021-10-23 – 2021-10-27 (×8): 100 mg via ORAL
  Filled 2021-10-23 (×8): qty 2

## 2021-10-23 NOTE — Progress Notes (Signed)
SLP Cancellation Note  Patient Details Name: Jeff Spong Sr. MRN: [id number] DOB: [birth year]   Cancelled treatment:       Reason Eval/Treat Not Completed: Patient at procedure or test/unavailable (Pt currently working with  OT. SLP will follow up later as schedule allows.)  Lance Galas I. Hardin Negus, [place], [place] Office number [phone] Pager [place] 10/23/2021, 12:23 PM

## 2021-10-23 NOTE — Progress Notes (Addendum)
Progress Note   Patient: Jeff Stencil Sr. KGU:[id number] DOB: 06/04/1956 DOA: 10/15/2021     7 DOS: the patient was seen and examined on 10/23/2021   Brief hospital course: 66 year old M with PMH of CAD/remote stent on Plavix, chronic RF on 2 L, COPD, chronic pain on opiate, extensive DVT on Eliquis, DM-2, HTN, HLD and morbid obesity presenting with acute onset confusion and aphasia with concern for CVA.  Reportedly took 2 of the 200 mg trazodone and he is the pain medication prior to arrival.  UDS negative.  He tested positive for COVID-19 but symptomatic for over 2 weeks.   CT head concerning for 3 to 4 cm region of subacute infarction versus mass in left posterior temporal lobe.  Neurology consulted.  He was started on Keppra 500 mg twice daily. Spot and LTM EEG without seizure or epileptiform discharge.    CT angio head and neck and CT cerebral perfusion with contrast without LVO but showed 2.5 cm hyperemic mass in left posterior temporal lobe.   MRI brain without contrast also showed mass lesion of the left temporal lobe with associated edema but truncated exam due to patient's inability to tolerate setting of mental status change.  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    Neurosurgery consulted and started Decadron.  Plan for left temporal craniotomy and resection of tumor on 2/21.  Neurology, neurosurgery neurology and neurosurgery following.  Neurology and neurooncology, Dr. Mickeal Skinner recommended CT chest/abdomen/pelvis to exclude for possible primary elsewhere - appears cancelled due to allergy...will defer to Dr. Mickeal Skinner to order if needed   Assessment and Plan: * Acute toxic metabolic encephalopathy with aphasia with new left temporal mass- (present on admission) Acute onset.  Does not seem to have  other focal neuro deficit but limited exam due to confusion.  -No seizure activity since admission.   -Spot and LTM EEG negative for seizure.  -UDS, EtOH and TSH negative.  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 -Continue Keppra 500 mg twice daily- will discuss with neurology in case keppra is worsening delirium/behavior -has not been getting seroquel at night so will d/c -IV Haldol 5 mg every 6 hours- much improved -Seizure, fall, delirium and aspiration precaution  -Minimize sedating medications -SLP/PT/OT eval- CIR consulted  Agitation Seen encephalopathy above -improved with IV haldol- scheduled  Mass of left temporal lobe Multiple imaging showed 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 -CT chest/abdomen/pelvis not done---Neuro oncology, Dr. Mickeal Skinner consulted as well -path pending   Hypokalemia Resolved.  COPD with chronic hypoxic respiratory failure On 2.5 L by [place] at baseline.  Stable. -steroid neb -DuoNeb as needed  Hyperlipidemia - Atorvastatin as above.  Morbid obesity ([place]) Estimated body mass index is 37.86 kg/m as calculated from the following:   Height as of this encounter: 5\' 10"  (1.778 m).   Weight as of this encounter: 119.7 kg.  Sinus bradycardia- (present on admission) Likely due to beta-blocker.  He was on metoprolol 100 mg twice daily at home PO BB -Continue telemetry monitoring  Lab test positive for detection of COVID-19 virus- (present on admission) Previous attending discussed with lab (CT value 38.1).  Per wife, patient  has received initial 2 doses of COVID-vaccine and 1 booster shot.  He started having cough and congestion 2 weeks ago.  Chronically uses 2.5 L supplemental oxygen and no change in oxygen requirement from baseline.  CXR without acute  finding.  Inflammatory markers within normal.   Diabetes mellitus type 2, noninsulin dependent ([facility]) Uncontrolled NIDDM-2 with hyperglycemia and other complications: F0O 7.1%.  Seems he is only on glipizide at home.  Hyperglycemia likely due to steroid. -Continue SSI-steroid scale. -Increase basal insulin to 20 units now that more awake and eating    Essential hypertension Stable. -Cardiac meds as above.  History of DVT (deep vein thrombosis) -Hold Eliquis- resume when ok with NS  CAD S/P percutaneous coronary angioplasty No anginal symptoms. -Continue IV metoprolol 5 mg every 6 hours while NPO. -Continue holding Hyzaar. -Continue holding torsemide.  Per wife, takes Lasix due to dehydration with torsemide.  -Hold Plavix and Eliquis    -will hold on changing seizure medication for now due to elevated liver enzymes and delirium improving on haldol   Subjective: mental status improved this AM- more interactive, wants to watch TV  Physical Exam: Vitals:   10/23/21 0900 10/23/21 1000 10/23/21 1200 10/23/21 1300  BP: (!) 141/78 (!) 150/99 (!) 163/91 (!) 144/88  Pulse: 88 88 (!) 185   Resp: (!) 23 (!) 23 19 14   Temp:   99.2 F (37.3 C)   TempSrc:   Axillary   SpO2: 98% 100% 100%   Weight:      Height:        General: Appearance:    Obese male in no acute distress     Lungs:    respirations unlabored      MS:   All extremities are intact.    Neurologic:   Awake, alert, oriented x 3. No apparent focal neurological           defect.      Data Reviewed: LFTs only slightly elevated  Family Communication: wife at bedside  Disposition: Status is: Inpatient Remains inpatient appropriate because: needs rehab          Planned Discharge Destination: Rehab     Time spent: 65 minutes  Author: Geradine Girt, DO 10/23/2021 2:46 PM  For on call review [email or web address].

## 2021-10-23 NOTE — Progress Notes (Signed)
Speech Language Pathology Treatment: Dysphagia;Cognitive-Linquistic (Aphasia)  Patient Details Name: Jeff Wood. MRN: [id number] DOB: 02/23/1956 Today's Date: 10/23/2021 Time: 8413-2440 SLP Time Calculation (min) (ACUTE ONLY): 22 min  Assessment / Plan / Recommendation Clinical Impression  Pt was seen for treatment. He was alert and cooperative during the session with notable improvement in behavior compared to the preceding days. Pt was seen during lunch and he consumed a meal of broccoli, crispy baked tilapia, and mac and cheese with thin liquids via straw. Pt tolerated solids and liquids without symptoms of oropharyngeal dysphagia including s/sx of aspiration. Pt requested that additional boluses be deferred so regular textures were not presented. Pt was able to participate in simple conversation with the SLP. Word retrieval difficulty was frequently noted, but pt was able to retrieve more words with verbal prompts and utterance length was longer than the 4-word utterances noted on 2/23. For example pt stated, "The people after seven let me go by myself." When discussing why he has not been allowed to go to the bathroom independently. Phonemic paraphasias and neologistic errors were demonstrated, but self-correction was intermittently noted. SLP will continue to follow pt.     HPI HPI: Pt is a 66 y.o. male who presented to the ED for evaluation of acute onset aphasia. MRI brain 2/17,2/19: Mass involving the posterior left temporal lobe with associated edema. Neurosurgery concerned for glioblastoma and pt s/p cranioromy and resection  2/21. Incidental finding of COVID-19. Pt passed [facility] 2/17. PMH: CAD on Plavix, DVT on Eliquis, hypertension, hyperlipidemia, type II diabetes.      SLP Plan  Continue with current plan of care      Recommendations for follow up therapy are one component of a multi-disciplinary discharge planning process, led by the attending physician.  Recommendations may  be updated based on patient status, additional functional criteria and insurance authorization.    Recommendations  Diet recommendations: Dysphagia 3 (mechanical soft);Thin liquid Liquids provided via: Cup;Straw Medication Administration: Whole meds with puree Supervision: Staff to assist with self feeding Compensations: Slow rate;Small sips/bites;Minimize environmental distractions Postural Changes and/or Swallow Maneuvers: Seated upright 90 degrees                Oral Care Recommendations: Oral care BID Follow Up Recommendations:  (TBD) Assistance recommended at discharge: Frequent or constant Supervision/Assistance SLP Visit Diagnosis: Dysphagia, unspecified (R13.10) Plan: Continue with current plan of care         Khianna Blazina I. Hardin Negus, [place], [place] Office number [phone] Pager [place]  10/23/2021, 3:04 PM

## 2021-10-23 NOTE — Progress Notes (Signed)
Physical Therapy Treatment Patient Details Name: Jeff Mulvey Sr. MRN: [id number] DOB: Apr 18, 1956 Today's Date: 10/23/2021   History of Present Illness 66 year old male with acute onset of aphasia and confusion. 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 Now s/p L craniotomy on 10/20/21 Patient's medical history includes type 2 diabetes mellitus, coronary artery disease and a remote history of DVT still being treated on Eliquis and Plavix.    PT Comments    Patient progressing this session able to ambulate in hallway with HHA of 2 and demonstrated improved communication and less agitation.  Initially lethargic, but easily aroused.  He needed assistance for perineal hygiene and though seemed painful with hygiene quickly resolved and moved on to his desire to show photos of his dog.  He also indicated eager to eat.  Family in the room and supportive. He continues to be an excellent candidate for acute inpatient rehab prior to d/c home.    Recommendations for follow up therapy are one component of a multi-disciplinary discharge planning process, led by the attending physician.  Recommendations may be updated based on patient status, additional functional criteria and insurance authorization.  Follow Up Recommendations  Acute inpatient rehab (3hours/day)     Assistance Recommended at Discharge Frequent or constant Supervision/Assistance  Patient can return home with the following Two people to help with walking and/or transfers;Two people to help with bathing/dressing/bathroom;Assistance with cooking/housework;Direct supervision/assist for medications management;Help with stairs or ramp for entrance   Equipment Recommendations  Rolling walker (2 wheels)    Recommendations for Other Services       Precautions / Restrictions Precautions Precautions: Fall     Mobility  Bed  Mobility Overal bed mobility: Needs Assistance Bed Mobility: Supine to Sit     Supine to sit: Mod assist, +2 for physical assistance, HOB elevated     General bed mobility comments: feet already off the bed, assist for lifting heavy trunk    Transfers Overall transfer level: Needs assistance Equipment used: 2 person hand held assist Transfers: Sit to/from Stand Sit to Stand: Mod assist, +2 physical assistance   Step pivot transfers: Min assist, +2 physical assistance       General transfer comment: performed sit to stand x 3, increased time for anterior weight shift and pt pulling up with HHA x 2, reaching back for chair with cues and hand placement; stand step to recliner initially with cues for sequencing/technique and assist for lines    Ambulation/Gait Ambulation/Gait assistance: Min assist, +2 physical assistance Gait Distance (Feet): 80 Feet Assistive device: 2 person hand held assist Gait Pattern/deviations: Step-through pattern, Decreased stride length, Shuffle       General Gait Details: shortened steps and decreased step height, but ambulated in hallway with 2 A for safety and directing around obstacles and for turns   Stairs             Wheelchair Mobility    Modified Rankin (Stroke Patients Only)       Balance Overall balance assessment: Needs assistance Sitting-balance support: Feet supported Sitting balance-Leahy Scale: Poor Sitting balance - Comments: mild posterior bias   Standing balance support: Bilateral upper extremity supported Standing balance-Leahy Scale: Poor Standing balance comment: UE support for balance                            Cognition Arousal/Alertness: Awake/alert (initially  lethargic, aroused with stimulation)   Overall Cognitive Status: Within Functional Limits for tasks assessed Area of Impairment: Attention, Problem solving, Safety/judgement, Orientation                 Orientation Level:  Disoriented to, Place, Time, Situation Current Attention Level: Sustained   Following Commands: Follows one step commands consistently, Follows one step commands with increased time Safety/Judgement: Decreased awareness of deficits, Decreased awareness of safety   Problem Solving: Slow processing, Difficulty sequencing, Requires verbal cues General Comments: able to clearly state name of his dog, wife and daughter, difficulty expressing other thoughts due to aphasia        Exercises General Exercises - Lower Extremity Ankle Circles/Pumps: AAROM, 5 reps, Both, Supine Heel Slides: AAROM, Both, 5 reps, Supine    General Comments General comments (skin integrity, edema, etc.): wife & daughter in the room; pt eager to show photo of his dog "Jeff Wood" and engaged for several minutes looking through photos on his daughter's phone.  Wife and daughter educated throughout session on what to expect with pt's injury and for less stimulation during therapy and difficult tasks. Neurosurgeon in the room end of session giving family information on path report and possible next steps.      Pertinent Vitals/Pain Pain Assessment Faces Pain Scale: Hurts little more Pain Location: buttocks during perineal hygiene Pain Descriptors / Indicators: Grimacing, Guarding Pain Intervention(s): Monitored during session, Repositioned (applied sacral pad and barrier cream, geomat in chair)    Home Living                          Prior Function            PT Goals (current goals can now be found in the care plan section) Progress towards PT goals: Progressing toward goals    Frequency    Min 4X/week      PT Plan Current plan remains appropriate    Co-evaluation PT/OT/SLP Co-Evaluation/Treatment: Yes Reason for Co-Treatment: Complexity of the patient's impairments (multi-system involvement);Necessary to address cognition/behavior during functional activity;To address functional/ADL transfers PT  goals addressed during session: Mobility/safety with mobility;Balance;Strengthening/ROM        AM-PAC PT "6 Clicks" Mobility   Outcome Measure  Help needed turning from your back to your side while in a flat bed without using bedrails?: A Lot Help needed moving from lying on your back to sitting on the side of a flat bed without using bedrails?: A Lot Help needed moving to and from a bed to a chair (including a wheelchair)?: A Lot Help needed standing up from a chair using your arms (e.g., wheelchair or bedside chair)?: A Lot Help needed to walk in hospital room?: A Lot Help needed climbing 3-5 steps with a railing? : Total 6 Click Score: 11    End of Session Equipment Utilized During Treatment: Gait belt Activity Tolerance: Patient tolerated treatment well Patient left: in chair;with call bell/phone within reach;with restraints reapplied;with chair alarm set;with family/visitor present   PT Visit Diagnosis: Other abnormalities of gait and mobility (R26.89);Other symptoms and signs involving the nervous system (R29.898);Muscle weakness (generalized) (M62.81)     Time: 6440-3474 PT Time Calculation (min) (ACUTE ONLY): 41 min  Charges:  $Gait Training: 8-22 mins                     Magda Kiel, PT Acute Rehabilitation Services [id number]:[phone] [phone] 10/23/2021    Reginia Naas 10/23/2021, 1:39 PM

## 2021-10-23 NOTE — Progress Notes (Signed)
Inpatient Rehabilitation Admissions Coordinator  ° °I met at bedside with patient , his wife and daughter, Crystal. I discussed goals and expectations of a possible Cir admit. They prefer CIR rather than SNF. I explained that I will have to assess how patient is able to participate with therapies on acute to determine if he would be a candidate or not for CIR. I will follow up on Monday. ° ° , RN, MSN °Rehab Admissions Coordinator °[phone] °10/23/2021 12:22 PM ° °

## 2021-10-23 NOTE — Progress Notes (Signed)
° °  Providing Compassionate, Quality Care - Together   Subjective: Patient's wife is anxious to receive the results from pathology. The patient appears calmer today. He is still restrained due to impulsivity and lack of safety awareness.   Objective: Vital signs in last 24 hours: Temp:  [97.6 F (36.4 C)-99.7 F (37.6 C)] 97.6 F (36.4 C) (02/24 0800) Pulse Rate:  [67-121] 81 (02/24 0800) Resp:  [12-29] 24 (02/24 0800) BP: (109-184)/(58-107) 148/66 (02/24 0800) SpO2:  [94 %-100 %] 99 % (02/24 0829)  Intake/Output from previous day: 02/23 0701 - 02/24 0700 In: 420.3 [P.O.:120; IV Piggyback:300.3] Out: 800 [Urine:800] Intake/Output this shift: No intake/output data recorded.  Responds to voice Oriented to person Speech slurred, intermittent aphasia MAE, strength intact Incision covered with gauze; dressing is clean, dry, and intact. Incision is closed with staples. Surgical wound is well-approximated, without signs of infection.    Lab Results: Recent Labs    10/21/21 0607 10/23/21 0658  WBC 10.2 7.0  HGB 12.8* 13.3  HCT 38.5* 40.0  PLT 146* 198   BMET Recent Labs    10/21/21 0607 10/23/21 0658  NA 139 143  K 3.7 3.6  CL 104 110  CO2 22 22  GLUCOSE 245* 193*  BUN 11 18  CREATININE 0.95 0.90  CALCIUM 8.7* 9.0    Studies/Results: No results found.  Assessment/Plan: Patient underwent right craniotomy for resection of tumor by Dr. Annette Stable on 10/20/2021. His neuro exam is slightly improved compared to prior to surgery.   LOS: 7 days   -Contacted pathology to see if pathologist could give [place] an estimated time for results. Pathology will contact us if able to give [place] preliminary results. -Continue to work with therapies. Patient is now off contact precautions and will be considered for CIR. -Continue supportive efforts.   Viona Gilmore, DNP, AGNP-C Nurse Practitioner  [facility] [place] [address], [address], [place],  [place] [phone] P: [phone]     F: [phone]  10/23/2021, 9:20 AM

## 2021-10-23 NOTE — Progress Notes (Signed)
Occupational Therapy Treatment Patient Details Name: Jeff Wood. MRN: [id number] DOB: 1956-03-06 Today's Date: 10/23/2021   History of present illness 66 year old male with acute onset of aphasia and confusion. 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 Now s/p L craniotomy on 10/20/21 Patient's medical history includes type 2 diabetes mellitus, coronary artery disease and a remote history of DVT still being treated on Eliquis and Plavix.   OT comments  Family present during session.Making excellent progress with OT. Mobility greatly improves agitation. Pt lethargic on arrival in bed. Once moved to EOB, pt talking with therapists and demonstrated excellent cooperation. Unaware of being incontinent, however expressed concern over having another BM. Able to ambulate in hall and participate in grooming and self feeding with mod redirectional cues. Pt enjoyed showing therapists pictures of his dog "Baby" and his grand children on the phone (pt smiling and enjoyed activity). Also asked for "chocolate ice cream". Educated family on importance of minimizing distractions to facilitate rehab. Daughter verbalized understanding. Pt/family thanked therapists.   Recommendations for follow up therapy are one component of a multi-disciplinary discharge planning process, led by the attending physician.  Recommendations may be updated based on patient status, additional functional criteria and insurance authorization.    Follow Up Recommendations  Acute inpatient rehab (3hours/day)    Assistance Recommended at Discharge Frequent or constant Supervision/Assistance  Patient can return home with the following  A lot of help with walking and/or transfers;A lot of help with bathing/dressing/bathroom;Direct supervision/assist for medications management;Direct supervision/assist for financial  management;Assist for transportation;Help with stairs or ramp for entrance;Assistance with cooking/housework   Equipment Recommendations  Other (comment) (RW)    Recommendations for Other Services Other (comment) (psych consult for meds to manage behavior)    Precautions / Restrictions Precautions Precautions: Fall Precaution Comments: restraints, occasional agitation vs lethargy Restrictions Weight Bearing Restrictions: No       Mobility Bed Mobility Overal bed mobility: Needs Assistance Bed Mobility: Supine to Sit Rolling: Mod assist Sidelying to sit: Mod assist, HOB elevated Supine to sit: Mod assist, +2 for physical assistance, HOB elevated     General bed mobility comments: feet already off the bed, assist for lifting heavy trunk    Transfers Overall transfer level: Needs assistance Equipment used: 2 person hand held assist Transfers: Sit to/from Stand Sit to Stand: +2 physical assistance, Min assist     Step pivot transfers: Min assist, +2 physical assistance     General transfer comment: improved initiation     Balance Overall balance assessment: Needs assistance Sitting-balance support: Feet supported Sitting balance-Leahy Scale: Poor Sitting balance - Comments: mild posterior bias   Standing balance support: Bilateral upper extremity supported Standing balance-Leahy Scale: Poor Standing balance comment: UE support for balance                           ADL either performed or assessed with clinical judgement   ADL Overall ADL's : Needs assistance/impaired   Eating/Feeding Details (indicate cue type and reason): hadn over hand at times; eventually able to bring finger food and milk with straw to mouth using L hand, however pt R hand dom (family education on importance of minimizing distractions when attempting self feeding; educated on proper tray set up with minimal items on tray; does best with finger foods; use of hand over hand  initially) Grooming: Maximal assistance  Upper Body Bathing: Maximal assistance   Lower Body Bathing: Total assistance   Upper Body Dressing : Maximal assistance   Lower Body Dressing: Total assistance   Toilet Transfer: +2 for physical assistance;BSC/3in1;Stand-pivot;Minimal assistance   Toileting- Clothing Manipulation and Hygiene: Total assistance Toileting - Clothing Manipulation Details (indicate cue type and reason): incontinent of BM     Functional mobility during ADLs: +2 for physical assistance;Minimal assistance      Extremity/Trunk Assessment Upper Extremity Assessment Upper Extremity Assessment: Generalized weakness (will further assess; appears to have more difficulty using R hand; decreased in-hand manipulation skills and generalized wekness)   Lower Extremity Assessment Lower Extremity Assessment: Defer to PT evaluation        Vision   Vision Assessment?:  (wears readers) Additional Comments: decreased visual attention; ? decreased R field?   Perception     Praxis Praxis Praxis: Impaired Praxis Impairment Details: Initiation;Motor planning    Cognition Arousal/Alertness: Awake/alert (initially lethargic, aroused with stimulation) Behavior During Therapy: Restless, Flat affect Overall Cognitive Status: Within Functional Limits for tasks assessed Area of Impairment: Attention, Problem solving, Safety/judgement, Orientation                 Orientation Level: Disoriented to, Place, Time, Situation Current Attention Level: Sustained   Following Commands: Follows one step commands consistently, Follows one step commands with increased time Safety/Judgement: Decreased awareness of deficits, Decreased awareness of safety   Problem Solving: Slow processing, Difficulty sequencing, Requires verbal cues General Comments: able to clearly state name of his dog, wife and daughter, difficulty expressing other thoughts due to aphasia        Exercises       Shoulder Instructions       General Comments wife adn daughter very suppotive    Pertinent Vitals/ Pain       Pain Assessment Pain Assessment: Faces Faces Pain Scale: Hurts little more Pain Location: buttocks during perineal hygiene Pain Descriptors / Indicators: Grimacing, Guarding  Home Living                                          Prior Functioning/Environment              Frequency  Min 2X/week        Progress Toward Goals  OT Goals(current goals can now be found in the care plan section)  Progress towards OT goals: Progressing toward goals  Acute Rehab OT Goals Patient Stated Goal: per family for [place] to reurnhome after rehab OT Goal Formulation: With family Time For Goal Achievement: 11/04/21 Potential to Achieve Goals: Fair ADL Goals Pt Will Perform Grooming: with set-up;with supervision;sitting Pt Will Perform Upper Body Bathing: with supervision;with set-up;sitting Pt Will Perform Lower Body Bathing: with min assist;sit to/from stand Pt Will Transfer to Toilet: with min guard assist;ambulating Pt Will Perform Toileting - Clothing Manipulation and hygiene: with min guard assist Additional ADL Goal #1: Pt will demosntrate sustained attention to task without redirectional cues in minimally distracting environment  Plan Discharge plan remains appropriate    Co-evaluation    PT/OT/SLP Co-Evaluation/Treatment: Yes Reason for Co-Treatment: Complexity of the patient's impairments (multi-system involvement);Necessary to address cognition/behavior during functional activity;For patient/therapist safety PT goals addressed during session: Mobility/safety with mobility;Balance;Strengthening/ROM OT goals addressed during session: ADL's and self-care;Strengthening/ROM      AM-PAC OT "6 Clicks" Daily Activity     Outcome Measure  Help from another person eating meals?: A Lot Help from another person taking care of personal grooming?:  A Lot Help from another person toileting, which includes using toliet, bedpan, or urinal?: Total Help from another person bathing (including washing, rinsing, drying)?: A Lot Help from another person to put on and taking off regular upper body clothing?: A Lot Help from another person to put on and taking off regular lower body clothing?: Total 6 Click Score: 10    End of Session Equipment Utilized During Treatment: Gait belt  OT Visit Diagnosis: Unsteadiness on feet (R26.81);Other abnormalities of gait and mobility (R26.89);Muscle weakness (generalized) (M62.81);Other symptoms and signs involving cognitive function;Pain Pain - part of body:  (butt)   Activity Tolerance Patient limited by lethargy   Patient Left in chair;with call bell/phone within reach;with chair alarm set;with restraints reapplied;with family/visitor present   Nurse Communication Mobility status;Other (comment) (encourage mobility/time OOB in chair reduces agitation)        Time: 7829-5621 OT Time Calculation (min): 51 min  Charges: OT General Charges $OT Visit: 1 Visit OT Treatments $Self Care/Home Management : 8-22 mins  Maurie Boettcher, OT/L   Acute OT Clinical Specialist [place] Pager [phone] Office [phone]   [facility] 10/23/2021, 2:42 PM

## 2021-10-24 ENCOUNTER — Inpatient Hospital Stay (HOSPITAL_COMMUNITY): Payer: Medicare Other

## 2021-10-24 DIAGNOSIS — I480 Paroxysmal atrial fibrillation: Secondary | ICD-10-CM

## 2021-10-24 LAB — CBC
HCT: 45.6 % (ref 39.0–52.0)
Hemoglobin: 15.5 g/dL (ref 13.0–17.0)
MCH: 31.7 pg (ref 26.0–34.0)
MCHC: 34 g/dL (ref 30.0–36.0)
MCV: 93.3 fL (ref 80.0–100.0)
Platelets: 308 10*3/uL (ref 150–400)
RBC: 4.89 MIL/uL (ref 4.22–5.81)
RDW: 13.5 % (ref 11.5–15.5)
WBC: 10 10*3/uL (ref 4.0–10.5)
nRBC: 0 % (ref 0.0–0.2)

## 2021-10-24 LAB — BASIC METABOLIC PANEL
Anion gap: 13 (ref 5–15)
BUN: 20 mg/dL (ref 8–23)
CO2: 21 mmol/L — ABNORMAL LOW (ref 22–32)
Calcium: 9.4 mg/dL (ref 8.9–10.3)
Chloride: 107 mmol/L (ref 98–111)
Creatinine, Ser: 0.92 mg/dL (ref 0.61–1.24)
GFR, Estimated: >60 mL/min (ref >60)
Glucose, Bld: 149 mg/dL — ABNORMAL HIGH (ref 70–99)
Potassium: 4.5 mmol/L (ref 3.5–5.1)
Sodium: 141 mmol/L (ref 135–145)

## 2021-10-24 LAB — GLUCOSE, CAPILLARY
Glucose-Capillary: 160 mg/dL — ABNORMAL HIGH (ref 70–99)
Glucose-Capillary: 169 mg/dL — ABNORMAL HIGH (ref 70–99)
Glucose-Capillary: 242 mg/dL — ABNORMAL HIGH (ref 70–99)
Glucose-Capillary: 220 mg/dL — ABNORMAL HIGH (ref 70–99)
Glucose-Capillary: 317 mg/dL — ABNORMAL HIGH (ref 70–99)
Glucose-Capillary: 251 mg/dL — ABNORMAL HIGH (ref 70–99)

## 2021-10-24 IMAGING — DX DG CHEST 1V PORT
1 series · 1 of 1 positions shown · non-contrast
Comparison: [DATE]

CLINICAL DATA: Cough

EXAM:
PORTABLE CHEST 1 VIEW

[chest ap]
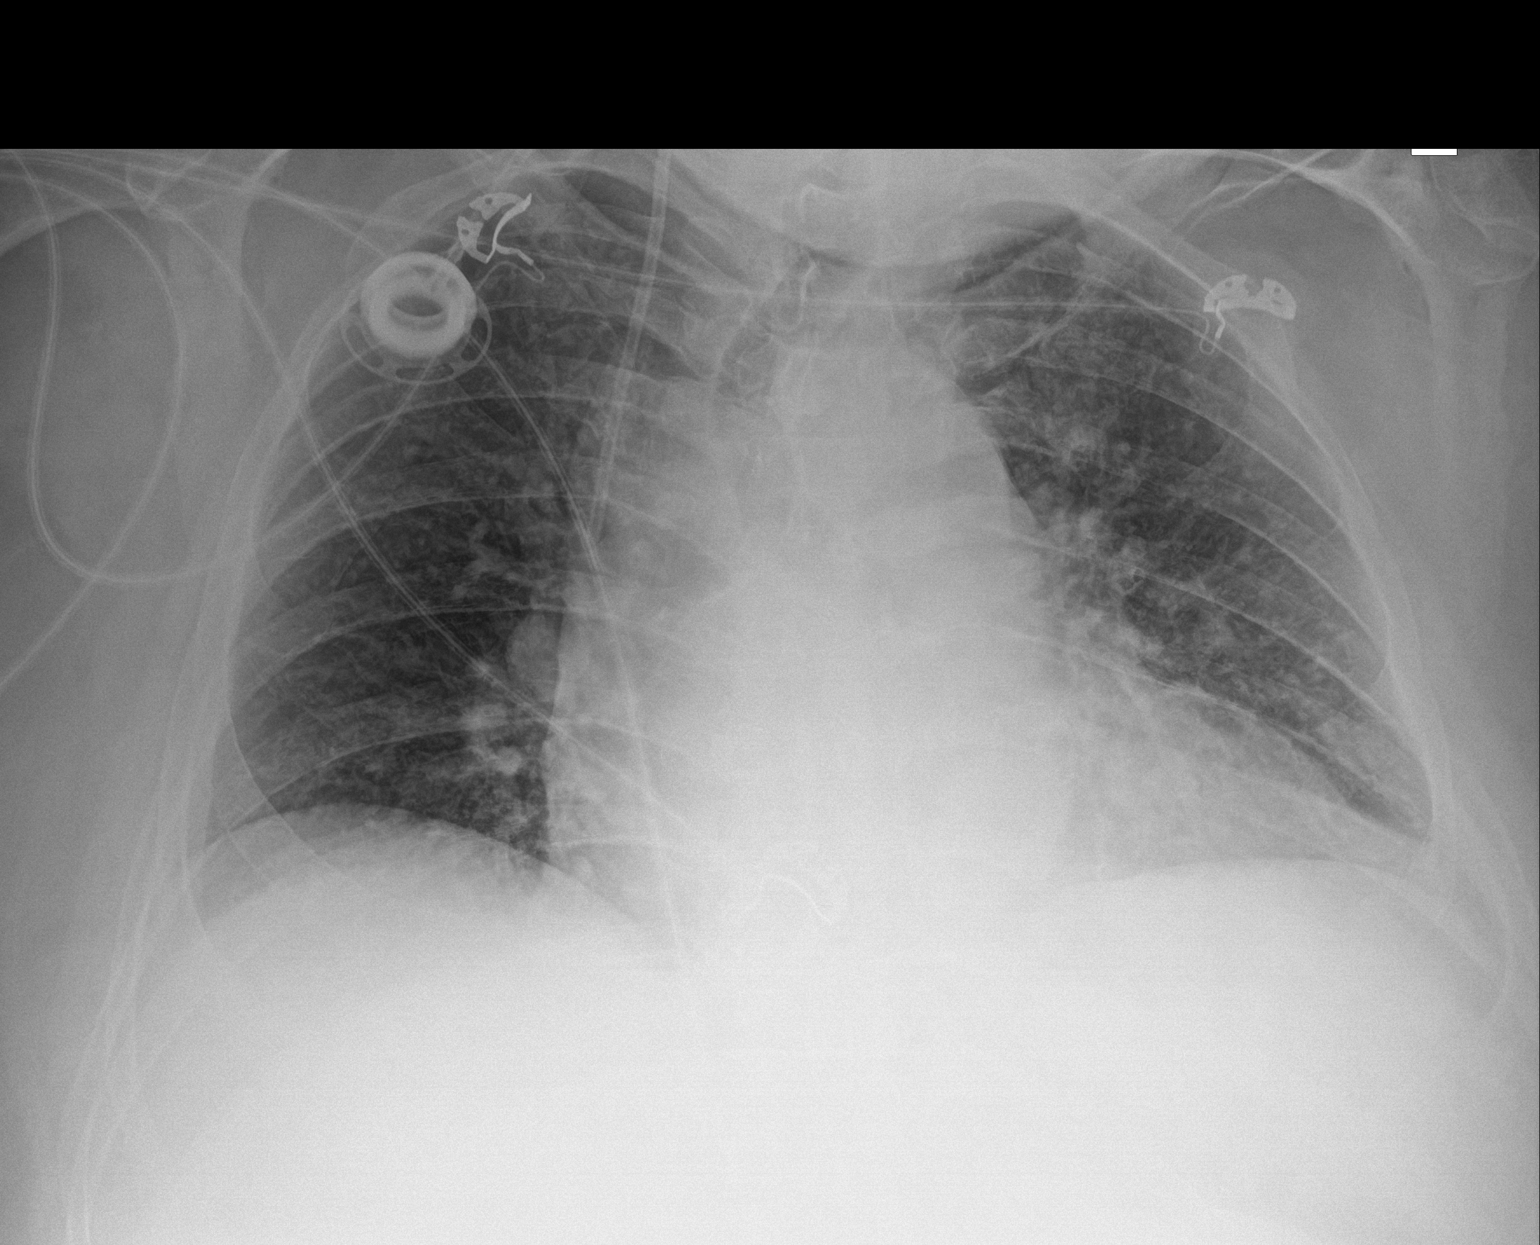

[1 of 1 positions shown; findings below may reference images not displayed]

FINDINGS: Transverse diameter of heart is increased. There is interval
increase in interstitial markings in the left parahilar region and
left lower lung fields. Right lung is unremarkable. There is poor
inspiration. There is minimal blunting of left lateral CP angle.
There is no pneumothorax. Tip of right IJ chest port is seen in the
superior vena cava.
IMPRESSION: Cardiomegaly. There is interval increase in interstitial markings in
the left parahilar region and left lower lung fields suggesting
possible interstitial pneumonia.

## 2021-10-24 MED ORDER — LEVETIRACETAM 500 MG PO TABS
1000.0000 mg | ORAL_TABLET | Freq: Two times a day (BID) | ORAL | Status: DC
  Administered 2021-10-24 – 2021-10-27 (×6): 1000 mg via ORAL
  Filled 2021-10-24 (×6): qty 2

## 2021-10-24 MED ORDER — DILTIAZEM HCL-DEXTROSE 125-5 MG/125ML-% IV SOLN (PREMIX)
5.0000 mg/h | INTRAVENOUS | Status: DC
  Administered 2021-10-24: 5 mg/h via INTRAVENOUS
  Filled 2021-10-24 (×2): qty 125

## 2021-10-24 MED ORDER — PANTOPRAZOLE SODIUM 40 MG PO TBEC
40.0000 mg | DELAYED_RELEASE_TABLET | Freq: Every day | ORAL | Status: DC
  Administered 2021-10-24 – 2021-10-25 (×2): 40 mg via ORAL
  Filled 2021-10-24 (×2): qty 1

## 2021-10-24 MED ORDER — HALOPERIDOL LACTATE 5 MG/ML IJ SOLN
2.5000 mg | Freq: Four times a day (QID) | INTRAMUSCULAR | Status: DC
  Administered 2021-10-24 – 2021-10-25 (×4): 2.5 mg via INTRAVENOUS
  Filled 2021-10-24 (×4): qty 1

## 2021-10-24 MED ORDER — DILTIAZEM LOAD VIA INFUSION
20.0000 mg | Freq: Once | INTRAVENOUS | Status: AC
  Administered 2021-10-24: 20 mg via INTRAVENOUS
  Filled 2021-10-24: qty 20

## 2021-10-24 MED ORDER — DEXAMETHASONE 4 MG PO TABS
2.0000 mg | ORAL_TABLET | Freq: Four times a day (QID) | ORAL | Status: DC
Start: 2021-10-24 — End: 2021-10-27
  Administered 2021-10-24 – 2021-10-27 (×12): 2 mg via ORAL
  Filled 2021-10-24 (×13): qty 1

## 2021-10-24 MED ORDER — TORSEMIDE 20 MG PO TABS
10.0000 mg | ORAL_TABLET | Freq: Every day | ORAL | Status: DC
  Administered 2021-10-24 – 2021-10-25 (×2): 10 mg via ORAL
  Filled 2021-10-24 (×2): qty 1

## 2021-10-24 MED ORDER — HYDRALAZINE HCL 20 MG/ML IJ SOLN
10.0000 mg | Freq: Four times a day (QID) | INTRAMUSCULAR | Status: DC | PRN
  Administered 2021-10-24 (×3): 10 mg via INTRAVENOUS
  Filled 2021-10-24 (×3): qty 1

## 2021-10-24 NOTE — Assessment & Plan Note (Addendum)
-  on BB -responded to cardizem bolus -elquis resumed

## 2021-10-24 NOTE — Progress Notes (Signed)
Patient arrived unit at this time, skin assessment performed with Probation officer and Data processing manager. Patient noted to have left sided craniotomy staples and one staple to the right forehead. Heat rash to upper back. Dry skin to heals. All other skin WDL. Sacal Foam in place. Wife at bedside. Patient recently given Haldol, So is groggy for assessment. Focused assessment performed. See flow sheet. Safety measures implemented. Will continue to monitor at this time.

## 2021-10-24 NOTE — Progress Notes (Signed)
Progress Note   Patient: Jeff Wood. [id number]:[id number] DOB: November 26, 1955 DOA: 10/15/2021     8 DOS: the patient was seen and examined on 10/24/2021   Brief hospital course: 66 year old M with PMH of CAD/remote stent on Plavix, chronic RF on 2 L, COPD, chronic pain on opiate, extensive DVT on Eliquis, DM-2, HTN, HLD and morbid obesity presenting with acute onset confusion and aphasia with concern for CVA.  Reportedly took 2 of the 200 mg trazodone and he is the pain medication prior to arrival.  UDS negative.  He tested positive for COVID-19 but symptomatic for over 2 weeks.   CT head concerning for 3 to 4 cm region of subacute infarction versus mass in left posterior temporal lobe.  Neurology consulted.  He was started on Keppra 500 mg twice daily. Spot and LTM EEG without seizure or epileptiform discharge.    CT angio head and neck and CT cerebral perfusion with contrast without LVO but showed 2.5 cm hyperemic mass in left posterior temporal lobe.   MRI brain without contrast also showed mass lesion of the left temporal lobe with associated edema but truncated exam due to patient's inability to tolerate setting of mental status change.  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    Neurosurgery consulted and started Decadron.  Plan for left temporal craniotomy and resection of tumor on 2/21.  Neurology, neurosurgery neurology and neurosurgery following.  Neurology and neurooncology, Dr. Mickeal Skinner recommended CT chest/abdomen/pelvis to exclude for possible primary elsewhere - appears cancelled due to allergy...will defer to Dr. Mickeal Skinner to order if needed  Mental status improved   Assessment and Plan: * Acute toxic metabolic encephalopathy with aphasia with new left temporal mass- (present on admission) Acute onset.   Does not seem to have other focal neuro deficit but limited exam due to confusion.  -No seizure activity since admission.   -Spot and LTM EEG negative for seizure.  -UDS, EtOH and TSH negative.  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 -Continue Keppra 500 mg twice daily- will discuss with neurology in case keppra is worsening delirium/behavior -has not been getting seroquel at night so will d/c -IV Haldol 5 mg every 6 hours- much improved-- plan to cut in half for 24 hours and then PRN -Seizure, fall, delirium and aspiration precaution  -Minimize sedating medications -SLP/PT/OT eval- CIR consulted  Paroxysmal atrial fibrillation with RVR (HCC) -on BB -responded to cardizem bolus -elquis on hold until 2/27  Agitation Seen encephalopathy above -improved with IV haldol- scheduled- lower dose to 2.5 and plan to go back PRN in the AM  Mass of left temporal lobe Multiple imaging showed 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 -CT chest/abdomen/pelvis not done---Neuro oncology, Dr. Mickeal Skinner consulted as well -path pending   Hypokalemia Resolved.  COPD with chronic hypoxic respiratory failure On 2.5 L by [place] at baseline.  Stable. -steroid neb -DuoNeb as needed  Hyperlipidemia - Atorvastatin as above.  Morbid obesity ([place]) Estimated body mass index is 37.86 kg/m as calculated from the following:   Height as of this encounter: 5\' 10"  (1.778 m).   Weight as of this encounter: 119.7 kg.  Sinus bradycardia- (present  on admission) Likely due to beta-blocker.  He was on metoprolol 100 mg twice daily at home PO BB -Continue telemetry monitoring  Lab test positive for detection of COVID-19 virus- (present on admission) Previous attending discussed with lab (CT value 38.1).  Per wife,  patient has received initial 2 doses of COVID-vaccine and 1 booster shot.  He started having cough and congestion 2 weeks ago.  Chronically uses 2.5 L supplemental oxygen and no change in oxygen requirement from baseline.  CXR without acute finding.  Inflammatory markers within normal.   Diabetes mellitus type 2, noninsulin dependent (HCC) Uncontrolled NIDDM-2 with hyperglycemia and other complications: B9U 3.8%.  Seems he is only on glipizide at home.  Hyperglycemia likely due to steroid. -Continue SSI-steroid scale. -Increase basal insulin to 20 units now that more awake and eating    Essential hypertension Stable. -Cardiac meds as above.  History of DVT (deep vein thrombosis) -Hold Eliquis- resume when ok with NS (POD #7?)  CAD S/P percutaneous coronary angioplasty No anginal symptoms. -on BB -Continue holding Hyzaar. -resume torsemide but at a lower dose -Hold Plavix and Eliquis- can resume on Monday if stable per NS    Doubt keppra is contributing to delirium   Subjective: mental status improving  Physical Exam: Vitals:   10/24/21 0900 10/24/21 1000 10/24/21 1100 10/24/21 1200  BP: 130/76 128/85 133/77 123/75  Pulse: (!) 118 88 73 68  Resp: 14 11 (!) 21 20  Temp:    98 F (36.7 C)  TempSrc:    Oral  SpO2: 100% 100% 100% 100%  Weight:      Height:        General: Appearance:    Obese male in no acute distress     Lungs:      respirations unlabored  Heart:    Normal heart rate.   MS:   All extremities are intact.    Neurologic:   Awake, alert     Data Reviewed: AST/ALT mildly elevated   Disposition: Status is: Inpatient Remains inpatient appropriate because: needs CIR          Planned Discharge Destination: Rehab   Time spent: 75 minutes  Author: Geradine Girt, DO 10/24/2021 3:07 PM  For on call review [email or web address].

## 2021-10-24 NOTE — Progress Notes (Signed)
°  NEUROSURGERY PROGRESS NOTE   No issues overnight, was tachy this am started on cardizem gtt. Otherwise no new issues  EXAM:  BP 128/85    Pulse 88    Temp 98.2 F (36.8 C) (Oral)    Resp 11    Ht 5\' 10"  (1.778 m)    Wt 119.7 kg    SpO2 100%    BMI 37.86 kg/m   Awake, alert, oriented to person. Naming/repetition impaired Speech fluent CN grossly intact  Follows simple commands, MAE well Wound c/d/i   IMPRESSION:  66 y.o. male POD# 4 s/p left temporal crani for tumor. Appears neurologically stable with receptive aphasia. - A-fib with RVR  PLAN: - Cont supportive care - Cont cardizem for now - Can consider re-starting AC on POD# 7 - Cont therapy, may be a candidate for CIR dispo   Consuella Lose, MD [facility]

## 2021-10-25 ENCOUNTER — Encounter (HOSPITAL_COMMUNITY): Payer: Self-pay | Admitting: Internal Medicine

## 2021-10-25 LAB — GLUCOSE, CAPILLARY
Glucose-Capillary: 202 mg/dL — ABNORMAL HIGH (ref 70–99)
Glucose-Capillary: 224 mg/dL — ABNORMAL HIGH (ref 70–99)
Glucose-Capillary: 360 mg/dL — ABNORMAL HIGH (ref 70–99)
Glucose-Capillary: 227 mg/dL — ABNORMAL HIGH (ref 70–99)
Glucose-Capillary: 353 mg/dL — ABNORMAL HIGH (ref 70–99)
Glucose-Capillary: 293 mg/dL — ABNORMAL HIGH (ref 70–99)

## 2021-10-25 LAB — CBC
HCT: 43.9 % (ref 39.0–52.0)
Hemoglobin: 14.8 g/dL (ref 13.0–17.0)
MCH: 31.8 pg (ref 26.0–34.0)
MCHC: 33.7 g/dL (ref 30.0–36.0)
MCV: 94.2 fL (ref 80.0–100.0)
Platelets: 279 10*3/uL (ref 150–400)
RBC: 4.66 MIL/uL (ref 4.22–5.81)
RDW: 13.9 % (ref 11.5–15.5)
WBC: 9.7 10*3/uL (ref 4.0–10.5)
nRBC: 0 % (ref 0.0–0.2)

## 2021-10-25 LAB — BASIC METABOLIC PANEL
Anion gap: 14 (ref 5–15)
BUN: 25 mg/dL — ABNORMAL HIGH (ref 8–23)
CO2: 21 mmol/L — ABNORMAL LOW (ref 22–32)
Calcium: 9.3 mg/dL (ref 8.9–10.3)
Chloride: 105 mmol/L (ref 98–111)
Creatinine, Ser: 1.06 mg/dL (ref 0.61–1.24)
GFR, Estimated: >60 mL/min (ref >60)
Glucose, Bld: 235 mg/dL — ABNORMAL HIGH (ref 70–99)
Potassium: 3.7 mmol/L (ref 3.5–5.1)
Sodium: 140 mmol/L (ref 135–145)

## 2021-10-25 MED ORDER — INSULIN GLARGINE-YFGN 100 UNIT/ML ~~LOC~~ SOLN
24.0000 [IU] | Freq: Two times a day (BID) | SUBCUTANEOUS | Status: DC
  Administered 2021-10-25 – 2021-10-26 (×2): 24 [IU] via SUBCUTANEOUS
  Filled 2021-10-25 (×3): qty 0.24

## 2021-10-25 MED ORDER — HALOPERIDOL LACTATE 5 MG/ML IJ SOLN: 1.0000 mg | Freq: Four times a day (QID) | INTRAMUSCULAR | Status: DC

## 2021-10-25 MED ORDER — INSULIN ASPART 100 UNIT/ML IJ SOLN
4.0000 [IU] | Freq: Three times a day (TID) | INTRAMUSCULAR | Status: DC
  Administered 2021-10-25 – 2021-10-26 (×3): 4 [IU] via SUBCUTANEOUS

## 2021-10-25 MED ORDER — MONTELUKAST SODIUM 10 MG PO TABS
10.0000 mg | ORAL_TABLET | Freq: Every day | ORAL | Status: DC
  Administered 2021-10-25 – 2021-10-26 (×2): 10 mg via ORAL
  Filled 2021-10-25 (×2): qty 1

## 2021-10-25 MED ORDER — ISOSORBIDE MONONITRATE ER 30 MG PO TB24
30.0000 mg | ORAL_TABLET | Freq: Every day | ORAL | Status: DC
  Administered 2021-10-25 – 2021-10-27 (×3): 30 mg via ORAL
  Filled 2021-10-25 (×3): qty 1

## 2021-10-25 MED ORDER — INSULIN ASPART 100 UNIT/ML IJ SOLN
0.0000 [IU] | Freq: Three times a day (TID) | INTRAMUSCULAR | Status: DC
  Administered 2021-10-25 – 2021-10-26 (×2): 5 [IU] via SUBCUTANEOUS
  Administered 2021-10-26: 11 [IU] via SUBCUTANEOUS
  Administered 2021-10-27: 3 [IU] via SUBCUTANEOUS
  Administered 2021-10-27: 15 [IU] via SUBCUTANEOUS

## 2021-10-25 MED ORDER — QUETIAPINE FUMARATE 50 MG PO TABS
25.0000 mg | ORAL_TABLET | Freq: Every day | ORAL | Status: DC
  Administered 2021-10-25: 25 mg via ORAL
  Filled 2021-10-25: qty 1

## 2021-10-25 MED ORDER — INSULIN ASPART 100 UNIT/ML IJ SOLN
0.0000 [IU] | Freq: Every day | INTRAMUSCULAR | Status: DC
  Administered 2021-10-25 – 2021-10-26 (×2): 5 [IU] via SUBCUTANEOUS

## 2021-10-25 NOTE — Progress Notes (Addendum)
Progress Note   Patient: Jeff Melman Sr. KVQ:[id number] DOB: [birth year] DOA: 10/15/2021     9 DOS: the patient was seen and examined on 10/25/2021   Brief hospital course: 66 year old M with PMH of CAD/remote stent on Plavix, chronic RF on 2 L, COPD, chronic pain on opiate, extensive DVT on Eliquis, DM-2, HTN, HLD and morbid obesity presenting with acute onset confusion and aphasia with concern for CVA.  Reportedly took 2 of the 200 mg trazodone and he is the pain medication prior to arrival.  UDS negative.  He tested positive for COVID-19 but symptomatic for over 2 weeks.   CT head concerning for 3 to 4 cm region of subacute infarction versus mass in left posterior temporal lobe.  Neurology consulted.  He was started on Keppra 500 mg twice daily. Spot and LTM EEG without seizure or epileptiform discharge.    CT angio head and neck and CT cerebral perfusion with contrast without LVO but showed 2.5 cm hyperemic mass in left posterior temporal lobe.   MRI brain without contrast also showed mass lesion of the left temporal lobe with associated edema but truncated exam due to patient's inability to tolerate setting of mental status change.  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    Neurosurgery consulted and started Decadron.  Plan for left temporal craniotomy and resection of tumor on 2/21.  Neurology, neurosurgery neurology and neurosurgery following.  Neurology and neurooncology, Dr. Mickeal Skinner recommended CT chest/abdomen/pelvis to exclude for possible primary elsewhere - appears cancelled due to allergy...will defer to Dr. Mickeal Skinner to order if needed  Mental status improved   Assessment and Plan: * Acute toxic metabolic encephalopathy with aphasia with new left temporal mass- (present on admission) Acute onset.   Does not seem to have other focal neuro deficit but limited exam due to confusion.  -No seizure activity since admission.   -Spot and LTM EEG negative for seizure.  -UDS, EtOH and TSH negative.  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 -Continue Keppra 500 mg twice daily- will discuss with neurology in case keppra is worsening delirium/behavior -has not been getting seroquel at night so will d/c -IV Haldol schedule-- will d/c and try nightly seroquel -Seizure, fall, delirium and aspiration precaution  -Minimize sedating medications -SLP/PT/OT eval- CIR consulted  Paroxysmal atrial fibrillation with RVR ([place]) -on BB -responded to cardizem bolus -elquis on hold until 2/27  Agitation Seen encephalopathy above -improved with IV haldol-- will d/c and monitor-- seroquel QHS for now  Mass of left temporal lobe Multiple imaging showed 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 -CT chest/abdomen/pelvis not done---Neuro oncology, Dr. Mickeal Skinner consulted as well -path pending   Hypokalemia Resolved.  COPD with chronic hypoxic respiratory failure On 2.5 L by [place] at baseline.  Stable. -steroid neb -DuoNeb as needed  Hyperlipidemia - Atorvastatin as above.  Morbid obesity ([place]) Estimated body mass index is 37.86 kg/m as calculated from the following:   Height as of this encounter: 5\' 10"  (1.778 m).   Weight as of this encounter: 119.7 kg.  Sinus bradycardia- (present on admission) Likely due to beta-blocker.  He was on metoprolol 100 mg twice daily at home  PO BB -Continue telemetry monitoring  Lab test positive for detection of COVID-19 virus- (present on admission) Previous attending discussed with lab (CT value 38.1).  Per wife, patient has received initial 2 doses of COVID-vaccine and 1  booster shot.  He started having cough and congestion 2 weeks ago.  Chronically uses 2.5 L supplemental oxygen and no change in oxygen requirement from baseline.  CXR without acute finding.  Inflammatory markers within normal.   Diabetes mellitus type 2, noninsulin dependent (HCC) Uncontrolled NIDDM-2 with hyperglycemia and other complications: A7G 8.1%.  Seems he is only on glipizide at home.  Hyperglycemia likely due to steroid. -Continue SSI-steroid scale. -Increase basal insulin to 20 units now that more awake and eating    Essential hypertension Stable. -Cardiac meds as above.  History of DVT (deep vein thrombosis) -Hold Eliquis- resume when ok with NS (POD #7?)  CAD S/P percutaneous coronary angioplasty No anginal symptoms. -on BB -Continue holding Hyzaar. -resume torsemide but at a lower dose -Hold Plavix and Eliquis- can resume on Monday if stable per NS       Subjective: no SOB, wants to get cleaned up  Physical Exam: Vitals:   10/24/21 2123 10/24/21 2323 10/25/21 0323 10/25/21 0730  BP:  (!) 152/84 (!) 160/94   Pulse: 70 65 (!) 58   Resp: 15 15 19    Temp: 99.1 F (37.3 C) 98.9 F (37.2 C) 98.2 F (36.8 C)   TempSrc: Oral Oral Oral   SpO2: 94% 94% 91% 100%  Weight:      Height:        General: Appearance:    Obese male in no acute distress     Lungs:      respirations unlabored  Heart:    Bradycardic.   MS:   All extremities are intact.    Neurologic:   Awake, alert     Data Reviewed: Labs reviewed   Disposition: Status is: Inpatient Remains inpatient appropriate because: needs rehab          Planned Discharge Destination: Rehab   Time spent: 75 minutes  Author: Geradine Girt, DO 10/25/2021 12:03 PM  For on call review [email or web address].

## 2021-10-25 NOTE — Progress Notes (Signed)
°  NEUROSURGERY PROGRESS NOTE   No issues overnight, transferred to Progressive Care unit. No further tachcardia overnight.  EXAM:  BP (!) 160/94 (BP Location: Left Arm)    Pulse (!) 58    Temp 98.2 F (36.8 C) (Oral)    Resp 19    Ht 5\' 10"  (1.778 m)    Wt 119.7 kg    SpO2 91%    BMI 37.86 kg/m   Awake, alert, oriented to person. Naming/repetition impaired Speech fluent CN grossly intact  Follows simple commands, MAE well Wound c/d/i   IMPRESSION:  66 y.o. male POD# 5 s/p left temporal crani for tumor. Appears neurologically to be slowly improving from receptive aphasia. - A-fib with RVR controlled on oral meds  PLAN: - Cont supportive care - Can consider re-starting AC on POD# 7 - Cont therapy, may be a candidate for CIR dispo   Consuella Lose, MD [facility]

## 2021-10-26 ENCOUNTER — Inpatient Hospital Stay: Payer: Medicare Other | Attending: Internal Medicine

## 2021-10-26 ENCOUNTER — Other Ambulatory Visit: Payer: Self-pay | Admitting: Radiation Therapy

## 2021-10-26 DIAGNOSIS — Z7189 Other specified counseling: Secondary | ICD-10-CM

## 2021-10-26 DIAGNOSIS — Z515 Encounter for palliative care: Secondary | ICD-10-CM

## 2021-10-26 LAB — BASIC METABOLIC PANEL
Anion gap: 15 (ref 5–15)
BUN: 33 mg/dL — ABNORMAL HIGH (ref 8–23)
CO2: 22 mmol/L (ref 22–32)
Calcium: 9.4 mg/dL (ref 8.9–10.3)
Chloride: 99 mmol/L (ref 98–111)
Creatinine, Ser: 1.37 mg/dL — ABNORMAL HIGH (ref 0.61–1.24)
GFR, Estimated: 57 mL/min — ABNORMAL LOW (ref >60)
Glucose, Bld: 398 mg/dL — ABNORMAL HIGH (ref 70–99)
Potassium: 3.7 mmol/L (ref 3.5–5.1)
Sodium: 136 mmol/L (ref 135–145)

## 2021-10-26 LAB — CBC
HCT: 45.8 % (ref 39.0–52.0)
Hemoglobin: 15.5 g/dL (ref 13.0–17.0)
MCH: 31.8 pg (ref 26.0–34.0)
MCHC: 33.8 g/dL (ref 30.0–36.0)
MCV: 93.9 fL (ref 80.0–100.0)
Platelets: 246 10*3/uL (ref 150–400)
RBC: 4.88 MIL/uL (ref 4.22–5.81)
RDW: 13.5 % (ref 11.5–15.5)
WBC: 13.8 10*3/uL — ABNORMAL HIGH (ref 4.0–10.5)
nRBC: 0 % (ref 0.0–0.2)

## 2021-10-26 LAB — GLUCOSE, CAPILLARY
Glucose-Capillary: 260 mg/dL — ABNORMAL HIGH (ref 70–99)
Glucose-Capillary: 240 mg/dL — ABNORMAL HIGH (ref 70–99)
Glucose-Capillary: 338 mg/dL — ABNORMAL HIGH (ref 70–99)
Glucose-Capillary: 331 mg/dL — ABNORMAL HIGH (ref 70–99)
Glucose-Capillary: 361 mg/dL — ABNORMAL HIGH (ref 70–99)

## 2021-10-26 MED ORDER — INSULIN GLARGINE-YFGN 100 UNIT/ML ~~LOC~~ SOLN
28.0000 [IU] | Freq: Two times a day (BID) | SUBCUTANEOUS | Status: DC
  Administered 2021-10-26 – 2021-10-27 (×2): 28 [IU] via SUBCUTANEOUS
  Filled 2021-10-26 (×3): qty 0.28

## 2021-10-26 MED ORDER — INSULIN ASPART 100 UNIT/ML IJ SOLN
8.0000 [IU] | Freq: Three times a day (TID) | INTRAMUSCULAR | Status: DC
  Administered 2021-10-27 (×2): 8 [IU] via SUBCUTANEOUS

## 2021-10-26 MED ORDER — PANTOPRAZOLE SODIUM 40 MG PO TBEC
40.0000 mg | DELAYED_RELEASE_TABLET | Freq: Every day | ORAL | Status: DC
Start: 2021-10-26 — End: 2021-10-27
  Administered 2021-10-26 – 2021-10-27 (×2): 40 mg via ORAL
  Filled 2021-10-26 (×2): qty 1

## 2021-10-26 MED ORDER — QUETIAPINE 12.5 MG HALF TABLET
12.5000 mg | ORAL_TABLET | Freq: Every day | ORAL | Status: DC
  Administered 2021-10-26: 12.5 mg via ORAL
  Filled 2021-10-26 (×2): qty 1

## 2021-10-26 NOTE — PMR Pre-admission (Signed)
PMR Admission Coordinator Pre-Admission Assessment  Patient: Jeff Wood. is an 66 y.o., male MRN: [id number] DOB: [birth year] Height: 5\' 10"  (177.8 cm) Weight: 119.7 kg  Insurance Information HMO:     PPO:      PCP:      IPA:      80/20:      OTHER:  PRIMARY: Medicare a and b      Policy#: [id number]      Subscriber: pt Benefits:  Phone #: passport one source     Name: 2/23 Eff. Date: 12/28/2013     Deduct: $1600      Out of Pocket Max: none      Life Max: none CIR: 100%      SNF: 20 full days Outpatient: 80%     Co-Pay: 20% Home Health: 100%      Co-Pay: none DME: 80%     Co-Pay: 20% Providers: pt choice  SECONDARY: [employer]      Policy#: [id number]  Financial Counselor:       Phone#:   The Data Collection Information Summary for patients in Inpatient Rehabilitation Facilities with attached Privacy Act [place] Records was provided and verbally reviewed with: Patient and Family  Emergency Contact Information Contact Information     Name Relation Home Work Mobile   [place] Spouse   [place] Daughter   [phone]      Current Medical History  Patient Admitting Diagnosis: tumor resection  History of Present Illness: Jeff Wood. Jeff Wood is a 66 year old right-handed male history of , hyperlipidemia, COPD with 2 L oxygen, CAD on Plavix, DVT on Eliquis, hypertension, type 2 diabetes mellitus.  Per chart review patient lives with spouse.  1 level home 2 steps to entry.  Modified independent prior to admission.  Wife assist with medication management.  Presented 10/15/2021 with acute onset of aphasia.  Cranial CT scan question 3-4 cm region of subacute infarct versus possibly mass lesion in the left posterior temporal lobe.  CT angiogram head and neck no large vessel occlusion.  Hyperemic mass in the left posterior temporal lobe measuring about 2.5 cm in size.  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  Admission chemistries unremarkable aside glucose 230, urine drug screen negative, hemoglobin A1c 9.5.  Patient underwent left temporal craniotomy for tumor 10/20/2021 per Dr. Annette Stable.  Maintained on Decadron with slow taper.  EEG negative for seizure and maintained on Keppra for seizure prophylaxis..  Follow-up cranial CT scan 10/26/2021 pending.  Tolerating mechanical soft diet.    Complete NIHSS TOTAL: 3  Patient's medical record from [facility] has been reviewed by the rehabilitation admission coordinator and physician.  Past Medical History  Past Medical History:  Diagnosis Date   Morbid obesity ([place]) 10/17/2021   Has the patient had major surgery during 100 days prior to admission? Yes  Family History   family history is not on file.  Current Medications  Current Facility-Administered Medications:    acetaminophen (TYLENOL) tablet 650 mg, 650 mg, Oral, Q6H PRN, 650 mg at 10/24/21 1924 **OR** acetaminophen (TYLENOL) suppository 650 mg, 650 mg, Rectal, Q6H PRN, Earnie Larsson, MD   bisacodyl (DULCOLAX) suppository 10 mg, 10 mg, Rectal, Daily PRN, Vann, Jessica U, DO   budesonide (PULMICORT) nebulizer solution 0.25 mg, 0.25 mg, Nebulization, BID, Vann, Jessica U, DO, 0.25 mg  at 10/27/21 0809   Chlorhexidine Gluconate Cloth 2 % PADS 6 each, 6 each, Topical, Daily, Wendee Beavers T, MD, 6 each at 10/26/21 0829   dexamethasone (DECADRON) tablet 2 mg, 2 mg, Oral, Q6H, Consuella Lose, MD, 2 mg at 10/27/21 0455   hydrALAZINE (APRESOLINE) injection 10 mg, 10 mg, Intravenous, Q6H PRN, Lang Snow, NP, 10 mg at 10/24/21 2037   HYDROmorphone (DILAUDID) injection 0.5 mg, 0.5 mg, Intravenous, Q2H PRN, Vann, Jessica U, DO, 0.5 mg at 10/26/21 0353   insulin aspart (novoLOG) injection 0-15 Units, 0-15 Units, Subcutaneous, TID WC, Vann, Jessica U, DO, 3 Units at 10/27/21 0917    insulin aspart (novoLOG) injection 0-5 Units, 0-5 Units, Subcutaneous, QHS, Vann, Jessica U, DO, 5 Units at 10/26/21 2207   insulin aspart (novoLOG) injection 8 Units, 8 Units, Subcutaneous, TID WC, Vann, Jessica U, DO, 8 Units at 10/27/21 0920   insulin glargine-yfgn (SEMGLEE) injection 28 Units, 28 Units, Subcutaneous, BID, Vann, Jessica U, DO, 28 Units at 10/27/21 0919   ipratropium-albuterol (DUONEB) 0.5-2.5 (3) MG/3ML nebulizer solution 3 mL, 3 mL, Nebulization, Q6H PRN, Earnie Larsson, MD   isosorbide mononitrate (IMDUR) 24 hr tablet 30 mg, 30 mg, Oral, Daily, Vann, Jessica U, DO, 30 mg at 10/27/21 0919   levETIRAcetam (KEPPRA) tablet 1,000 mg, 1,000 mg, Oral, BID, Consuella Lose, MD, 1,000 mg at 10/27/21 0919   metoprolol tartrate (LOPRESSOR) tablet 100 mg, 100 mg, Oral, BID, Vann, Jessica U, DO, 100 mg at 10/27/21 0919   montelukast (SINGULAIR) tablet 10 mg, 10 mg, Oral, QHS, Vann, Jessica U, DO, 10 mg at 10/26/21 2207   nitroGLYCERIN (NITROSTAT) SL tablet 0.4 mg, 0.4 mg, Sublingual, Q5 min PRN, Earnie Larsson, MD, 0.4 mg at 10/17/21 1733   ondansetron (ZOFRAN) tablet 4 mg, 4 mg, Oral, Q4H PRN **OR** ondansetron (ZOFRAN) injection 4 mg, 4 mg, Intravenous, Q4H PRN, Pool, Mallie Mussel, MD   oxyCODONE-acetaminophen (PERCOCET/ROXICET) 5-325 MG per tablet 2 tablet, 2 tablet, Oral, Q6H PRN, Earnie Larsson, MD, 2 tablet at 10/27/21 0455   pantoprazole (PROTONIX) EC tablet 40 mg, 40 mg, Oral, Daily, Vann, Jessica U, DO, 40 mg at 10/27/21 0918   polyethylene glycol (MIRALAX / GLYCOLAX) packet 17 g, 17 g, Oral, BID, Vann, Jessica U, DO, 17 g at 10/27/21 0233   QUEtiapine (SEROQUEL) tablet 12.5 mg, 12.5 mg, Oral, QHS, Vann, Jessica U, DO, 12.5 mg at 10/26/21 2207  Patients Current Diet:  Diet Order             DIET DYS 3 Room service appropriate? No; Fluid consistency: Thin  Diet effective now                  Precautions / Restrictions Precautions Precautions: Fall Precaution Comments: restraints,  occasional agitation vs lethargy Restrictions Weight Bearing Restrictions: No   Has the patient had 2 or more falls or a fall with injury in the past year? No  Prior Activity Level Community (5-7x/wk): independent  Prior Functional Level Self Care: Did the patient need help bathing, dressing, using the toilet or eating? Independent  Indoor Mobility: Did the patient need assistance with walking from room to room (with or without device)? Independent  Stairs: Did the patient need assistance with internal or external stairs (with or without device)? Independent  Functional Cognition: Did the patient need help planning regular tasks such as shopping or remembering to take medications? Needed some help  Patient Information Are you of Hispanic, Latino/a,or Spanish origin?: A. No, not of Hispanic, Latino/a,  or Spanish origin What is your race?: A. White Do you need or want an interpreter to communicate with a doctor or health care staff?: 0. No  Patient's Response To:  Health Literacy and Transportation Is the patient able to respond to health literacy and transportation needs?: No Health Literacy - How often do you need to have someone help you when you read instructions, pamphlets, or other written material from your doctor or pharmacy?: Patient unable to respond In the past 12 months, has lack of transportation kept you from medical appointments or from getting medications?: No In the past 12 months, has lack of transportation kept you from meetings, work, or from getting things needed for daily living?: No  Development worker, international aid / [place] Devices/Equipment: None Home Equipment: Shower seat, Grab bars - toilet, Grab bars - tub/shower, Cane - single point  Prior Device Use: Indicate devices/aids used by the patient prior to current illness, exacerbation or injury?  cane  Current Functional Level Cognition  Arousal/Alertness: Awake/alert Overall Cognitive Status:  Impaired/Different from baseline Difficult to assess due to: Impaired communication, Level of arousal Current Attention Level: Selective, Sustained Orientation Level: Oriented to person, Oriented to place, Oriented to time Following Commands: Follows multi-step commands with increased time, Follows multi-step commands inconsistently Safety/Judgement: Decreased awareness of deficits General Comments: Pt with requiring increased time and cues throughout. With direct questions, pt answering ~50% - mutiple answers were not intelligable. Pt requiring Mod cues for ST memory to recall which tasks he was asked to perform. Requiring significant time throughout all ADLs Attention: Focused, Sustained Focused Attention: Impaired Focused Attention Impairment: Verbal complex Sustained Attention: Impaired Sustained Attention Impairment: Verbal basic    Extremity Assessment (includes Sensation/Coordination)  Upper Extremity Assessment: LUE deficits/detail, RUE deficits/detail RUE Deficits / Details: Limited shoulder ROM at RUE. Difficulty reaching towards sink in standing and using compensatory hiking of the shoulder to raech forward. Decreased grasp strength RUE Coordination: decreased fine motor, decreased gross motor LUE Deficits / Details: decreased grasp strength LUE Coordination: decreased fine motor, decreased gross motor  Lower Extremity Assessment: Defer to PT evaluation    ADLs  Overall ADL's : Needs assistance/impaired Eating/Feeding Details (indicate cue type and reason): hadn over hand at times; eventually able to bring finger food and milk with straw to mouth using L hand, however pt R hand dom (family education on importance of minimizing distractions when attempting self feeding; educated on proper tray set up with minimal items on tray; does best with finger foods; use of hand over hand initially) Grooming: Minimal assistance, Standing Grooming Details (indicate cue type and reason): Min A  for standing balance at sink. Pt also quickly fatigues and required seated rest break. Pt requiring Min cues for sequencing. Dropping grooming items as hand strength is poor Upper Body Bathing: Maximal assistance Lower Body Bathing: Total assistance Upper Body Dressing : Maximal assistance Lower Body Dressing: Total assistance Toilet Transfer: Minimal assistance, +2 for physical assistance, +2 for safety/equipment, Ambulation, BSC/3in1, Rolling walker (2 wheels) Toileting- Clothing Manipulation and Hygiene: Total assistance Toileting - Clothing Manipulation Details (indicate cue type and reason): incontinent of BM Functional mobility during ADLs: Minimal assistance, +2 for physical assistance, Rolling walker (2 wheels) General ADL Comments: Pt continues to present with decreased balance, strength, and cognition    Mobility  Overal bed mobility: Needs Assistance Bed Mobility: Sit to Supine Rolling: Mod assist Sidelying to sit: Mod assist, HOB elevated Supine to sit: Mod assist, +2 for physical assistance, HOB elevated  Sit to supine: Mod assist General bed mobility comments: Mod A for elevating BELs over EOB. Requiring assistance to reposition    Transfers  Overall transfer level: Needs assistance Equipment used: Rolling walker (2 wheels) Transfers: Sit to/from Stand Sit to Stand: Min assist Bed to/from chair/wheelchair/BSC transfer type:: Step pivot Step pivot transfers: Min assist, +2 physical assistance General transfer comment: up to stand from [facility] with min A for balance,    Ambulation / Gait / Stairs / Wheelchair Mobility  Ambulation/Gait Ambulation/Gait assistance: Min assist, Mod assist Gait Distance (Feet): 120 Feet Assistive device: Rolling walker (2 wheels) Gait Pattern/deviations: Step-through pattern, Decreased stride length, Wide base of support General Gait Details: mod cues for direction, assist for balance and walker management at times; able to perform cognitive  tasks with ambulation noting seasonal decor and trying to state the holiday as well as finding room number when 3 rooms away    Posture / Balance Dynamic Sitting Balance Sitting balance - Comments: mild posterior bias Balance Overall balance assessment: Needs assistance Sitting-balance support: Feet supported Sitting balance-Leahy Scale: Poor Sitting balance - Comments: mild posterior bias Standing balance support: Bilateral upper extremity supported Standing balance-Leahy Scale: Poor Standing balance comment: UE support for balance    Special needs/care consideration O2 at 2.5 liters when asleep at home Wife has been staying at bedside on acute 24/7       Palliative consulted on acute for goals of care Previous Home Environment  Living Arrangements: Spouse/significant other (and daughter who moved to [facility] as a Theme park manager)  Lives With: Spouse, Daughter Available Help at Discharge: Family, Available 24 hours/day Type of Home: House Home Layout: One level Home Access: Stairs to enter Entrance Stairs-Rails: None Technical brewer of Steps: 2 Bathroom Shower/Tub: Chiropodist: Handicapped height Bathroom Accessibility: No Home Care Services: No  Moved to [place] 3 weeks pta  Discharge Living Setting Plans for Discharge Living Setting: Lives with (comment) (daughter and wife) Type of Home at Discharge: House Discharge Home Layout: One level Discharge Home Access: Stairs to enter Entrance Stairs-Rails: None Entrance Stairs-Number of Steps: 2 Discharge Bathroom Shower/Tub: Tub/shower unit Discharge Bathroom Toilet: Handicapped height Discharge Bathroom Accessibility: Yes How Accessible: Accessible via walker Does the patient have any problems obtaining your medications?: No  Social/Family/Support Systems Patient Roles: Spouse, Parent Contact Information: wife, Pamala Hurry and daughter, Teacher, music Anticipated Caregiver: wife and daughter Anticipated Garment/textile technologist Information: see contacts Ability/Limitations of Caregiver: wife may have mild dementia; daughter very hands on and supportive Caregiver Availability: 24/7 Discharge Plan Discussed with Primary Caregiver: Yes Is Caregiver In Agreement with Plan?: Yes Does Caregiver/Family have Issues with Lodging/Transportation while Pt is in Rehab?: Yes  Goals Patient/Family Goal for Rehab: supervision to min assist with PT, OT and SLP Expected length of stay: ELOS 10 to 12 days Additional Information Needs: Patient has an emotional support dog at home  Decrease burden of Care through IP rehab admission: n/a  Possible need for SNF placement upon discharge: not anticipated  Patient Condition: I have reviewed medical records from [facility], spoken with CM, and patient, spouse, and daughter. I met with patient at the bedside for inpatient rehabilitation assessment.  Patient will benefit from ongoing PT, OT, and SLP, can actively participate in 3 hours of therapy a day 5 days of the week, and can make measurable gains during the admission.  Patient will also benefit from the coordinated team approach during an Inpatient Acute Rehabilitation admission.  The patient will receive intensive  therapy as well as Rehabilitation physician, nursing, social worker, and care management interventions.  Due to bladder management, bowel management, safety, skin/wound care, disease management, medication administration, pain management, and patient education the patient requires 24 hour a day rehabilitation nursing.  The patient is currently mod assist overall with mobility and basic ADLs.  Discharge setting and therapy post discharge at home with home health is anticipated.  Patient has agreed to participate in the Acute Inpatient Rehabilitation Program and will admit today.  Preadmission Screen Completed By:  Cleatrice Burke, 10/27/2021 10:25  AM ______________________________________________________________________   Discussed status with Dr. Naaman Plummer on 10/27/2021 at 1025 and received approval for admission today.  Admission Coordinator:  Cleatrice Burke, RN, time  2330 Date  10/27/2021   Assessment/Plan: Diagnosis: brain tumor s/p resection 2/21 Does the need for close, 24 hr/day Medical supervision in concert with the patient's rehab needs make it unreasonable for this patient to be served in a less intensive setting? Yes Co-Morbidities requiring supervision/potential complications: copd, cad, htn dm Due to bladder management, bowel management, safety, skin/wound care, disease management, medication administration, pain management, and patient education, does the patient require 24 hr/day rehab nursing? Yes Does the patient require coordinated care of a physician, rehab nurse, PT, OT, and SLP to address physical and functional deficits in the context of the above medical diagnosis(es)? Yes Addressing deficits in the following areas: balance, endurance, locomotion, strength, transferring, bowel/bladder control, bathing, dressing, feeding, grooming, toileting, cognition, speech, and psychosocial support Can the patient actively participate in an intensive therapy program of at least 3 hrs of therapy 5 days a week? Yes The potential for patient to make measurable gains while on inpatient rehab is excellent Anticipated functional outcomes upon discharge from inpatient rehab: supervision and min assist PT, supervision and min assist OT, supervision and min assist SLP Estimated rehab length of stay to reach the above functional goals is: 10-12 days Anticipated discharge destination: Home 10. Overall Rehab/Functional Prognosis: excellent   MD Signature: Meredith Staggers, MD, [place] Director Rehabilitation Services 10/27/2021

## 2021-10-26 NOTE — H&P (Signed)
Physical Medicine and Rehabilitation Admission H&P    Chief Complaint  Patient presents with   Code Stroke  : HPI: Jeff Wood. Jeff Wood is a 66 year old right-handed male history of , hyperlipidemia, COPD with 2 L oxygen, CAD on Plavix, DVT on Eliquis, hypertension, type 2 diabetes mellitus.  Per chart review patient lives with spouse.  1 level home 2 steps to entry.  Modified independent prior to admission.  Wife assist with medication management.  Presented 10/15/2021 with acute onset of aphasia.  Cranial CT scan question 3-4 cm region of subacute infarct versus possibly mass lesion in the left posterior temporal lobe.  CT angiogram head and neck no large vessel occlusion.  Hyperemic mass in the left posterior temporal lobe measuring about 2.5 cm in size.  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  Admission chemistries unremarkable aside glucose 230, urine drug screen negative, hemoglobin A1c 9.5, lab test positive detection of COVID-19 virus per wife received initial 2 doses of COVID-vaccine and 1 booster shot.  Chest x-ray without acute findings inflammatory markers were normal initially on precautions and discontinued..  Patient underwent left temporal craniotomy for tumor 10/20/2021 per Dr. Annette Stable.  Maintained on Decadron with slow taper.  EEG negative for seizure and maintained on Keppra for seizure prophylaxis..  Follow-up cranial CT scan 10/26/2021 showed stable resection cavity with small volume adjacent parenchymal blood unchanged mild regional mass effect no new intracranial abnormality..  Pathology report pending to be presented to tumor board and Dr. Mickeal Skinner from medical oncology to follow-up.  Awaiting plan from neurosurgery on when to resume chronic Eliquis..  Tolerating mechanical soft diet.  Therapy evaluations completed due to patient decreased  functional mobility cognitive changes was admitted for a comprehensive rehab program.  Review of Systems  Constitutional:  Negative for chills and fever.  HENT:  Negative for hearing loss.   Eyes:  Negative for blurred vision and double vision.  Respiratory:  Positive for shortness of breath.   Cardiovascular:  Positive for leg swelling. Negative for chest pain and palpitations.  Gastrointestinal:  Positive for constipation. Negative for nausea and vomiting.  Genitourinary:  Negative for dysuria, flank pain and hematuria.  Musculoskeletal:  Positive for myalgias.  Skin:  Negative for rash.  Neurological:  Positive for speech change and weakness.  All other systems reviewed and are negative. Past Medical History:  Diagnosis Date   Morbid obesity ([place]) 10/17/2021   Past Surgical History:  Procedure Laterality Date   APPLICATION OF CRANIAL NAVIGATION Left 10/20/2021   Procedure: APPLICATION OF CRANIAL NAVIGATION;  Surgeon: Earnie Larsson, MD;  Location: [facility];  Service: Neurosurgery;  Laterality: Left;   CRANIOTOMY Left 10/20/2021   Procedure: Left temporal craniotomy for tumor with Brain Lab;  Surgeon: Earnie Larsson, MD;  Location: [facility];  Service: Neurosurgery;  Laterality: Left;   RADIOLOGY WITH ANESTHESIA N/A 10/18/2021   Procedure: MRI WITH ANESTHESIA;  Surgeon: Radiologist, Medication, MD;  Location: [facility];  Service: Radiology;  Laterality: N/A;   History reviewed. No pertinent family history. Social History:  has no history on file for tobacco use, alcohol use, and drug use. Allergies:  Allergies  Allergen Reactions   Iodine Anaphylaxis   Shellfish Allergy Anaphylaxis   Medications Prior to Admission  Medication Sig Dispense Refill   acetaminophen (TYLENOL) 500 MG tablet Take 500 mg by mouth every 6 (six)  hours as needed for headache.     albuterol (VENTOLIN HFA) 108 (90 Base) MCG/ACT inhaler Inhale 1-2 puffs into the lungs every 6 (six) hours as needed for wheezing or shortness of  breath.     apixaban (ELIQUIS) 5 MG TABS tablet Take 5 mg by mouth 2 (two) times daily.     atorvastatin (LIPITOR) 40 MG tablet Take 40 mg by mouth daily.     budesonide (PULMICORT) 0.5 MG/2ML nebulizer solution Take 0.5 mg by nebulization 2 (two) times daily as needed (wheezing).     Cholecalciferol (VITAMIN D3 GUMMIES PO) Take 2 tablets by mouth every evening.     clopidogrel (PLAVIX) 75 MG tablet Take 75 mg by mouth daily.     famotidine (PEPCID) 20 MG tablet Take 20 mg by mouth daily.     fluticasone (FLONASE) 50 MCG/ACT nasal spray Place 2 sprays into both nostrils in the morning and at bedtime.     FLUTICASONE FUROATE-VILANTEROL IN Inhale 1 puff into the lungs daily.     Fluticasone-Umeclidin-Vilant (TRELEGY ELLIPTA IN) Inhale 1 puff into the lungs daily.     gabapentin (NEURONTIN) 300 MG capsule Take 300 mg by mouth 3 (three) times daily.     gemfibrozil (LOPID) 600 MG tablet Take 600 mg by mouth 2 (two) times daily before a meal.     glipiZIDE (GLUCOTROL XL) 5 MG 24 hr tablet Take 5 mg by mouth in the morning and at bedtime.     ipratropium-albuterol (DUONEB) 0.5-2.5 (3) MG/3ML SOLN Take 3 mLs by nebulization every 6 (six) hours as needed (shortness of breath).     isosorbide mononitrate (IMDUR) 30 MG 24 hr tablet Take 30 mg by mouth daily.     losartan-hydrochlorothiazide (HYZAAR) 100-12.5 MG tablet Take 1 tablet by mouth daily.     metoprolol tartrate (LOPRESSOR) 100 MG tablet Take 100 mg by mouth 2 (two) times daily.     montelukast (SINGULAIR) 10 MG tablet Take 10 mg by mouth every evening.     Multiple Vitamins-Minerals (MENS MULTIVITAMIN PO) Take 1 tablet by mouth daily.     nitroGLYCERIN (NITROSTAT) 0.4 MG SL tablet Place 0.4 mg under the tongue every 5 (five) minutes as needed for chest pain.     oxyCODONE-acetaminophen (PERCOCET) 10-325 MG tablet Take 1 tablet by mouth 4 (four) times daily as needed for pain.     pantoprazole (PROTONIX) 40 MG tablet Take 40 mg by mouth daily.      tiZANidine (ZANAFLEX) 4 MG tablet Take 4 mg by mouth every 8 (eight) hours as needed for muscle spasms.     torsemide (DEMADEX) 20 MG tablet Take 20 mg by mouth daily.     traZODone (DESYREL) 100 MG tablet Take 200 mg by mouth at bedtime as needed for sleep.        Home: Home Living Family/patient expects to be discharged to:: Inpatient rehab Living Arrangements: Spouse/significant other (and daughter who moved to [facility] as a Theme park manager) Available Help at Discharge: Family, Available 24 hours/day Type of Home: House Home Access: Stairs to enter [employer] of Steps: 2 Entrance Stairs-Rails: None Home Layout: One level Bathroom Shower/Tub: Chiropodist: Handicapped height Bathroom Accessibility: No Home Equipment: Civil engineer, contracting, Grab bars - toilet, Grab bars - tub/shower, Radio producer - single point  Lives With: Spouse, Daughter   Functional History: Prior Function Prior Level of Function : Independent/Modified Independent, Needs assist  Cognitive Assist : ADLs (cognitive) ADLs (Cognitive): Intermittent cues ADLs Comments: wife assists  wtih medicaiton management; wears 2.5 L at home when sleeping/napping; has support dog down stairs; enjoys doing christain word search  Functional Status:  Mobility: Bed Mobility Overal bed mobility: Needs Assistance Bed Mobility: Sit to Supine Rolling: Mod assist Sidelying to sit: Mod assist, HOB elevated Supine to sit: Mod assist, +2 for physical assistance, HOB elevated Sit to supine: Mod assist General bed mobility comments: Mod A for elevating BELs over EOB. Requiring assistance to reposition Transfers Overall transfer level: Needs assistance Equipment used: Rolling walker (2 wheels) Transfers: Sit to/from Stand Sit to Stand: Min assist Bed to/from chair/wheelchair/BSC transfer type:: Step pivot Step pivot transfers: Min assist, +2 physical assistance General transfer comment: up to stand from [facility] with min A for  balance, Ambulation/Gait Ambulation/Gait assistance: Min assist, Mod assist Gait Distance (Feet): 120 Feet Assistive device: Rolling walker (2 wheels) Gait Pattern/deviations: Step-through pattern, Decreased stride length, Wide base of support General Gait Details: mod cues for direction, assist for balance and walker management at times; able to perform cognitive tasks with ambulation noting seasonal decor and trying to state the holiday as well as finding room number when 3 rooms away    ADL: ADL Overall ADL's : Needs assistance/impaired Eating/Feeding Details (indicate cue type and reason): hadn over hand at times; eventually able to bring finger food and milk with straw to mouth using L hand, however pt R hand dom (family education on importance of minimizing distractions when attempting self feeding; educated on proper tray set up with minimal items on tray; does best with finger foods; use of hand over hand initially) Grooming: Minimal assistance, Standing Grooming Details (indicate cue type and reason): Min A for standing balance at sink. Pt also quickly fatigues and required seated rest break. Pt requiring Min cues for sequencing. Dropping grooming items as hand strength is poor Upper Body Bathing: Maximal assistance Lower Body Bathing: Total assistance Upper Body Dressing : Maximal assistance Lower Body Dressing: Total assistance Toilet Transfer: Minimal assistance, +2 for physical assistance, +2 for safety/equipment, Ambulation, BSC/3in1, Rolling walker (2 wheels) Toileting- Clothing Manipulation and Hygiene: Total assistance Toileting - Clothing Manipulation Details (indicate cue type and reason): incontinent of BM Functional mobility during ADLs: Minimal assistance, +2 for physical assistance, Rolling walker (2 wheels) General ADL Comments: Pt continues to present with decreased balance, strength, and cognition  Cognition: Cognition Overall Cognitive Status: Impaired/Different  from baseline Arousal/Alertness: Awake/alert Orientation Level: Oriented to person, Oriented to place, Oriented to time Attention: Focused, Sustained Focused Attention: Impaired Focused Attention Impairment: Verbal complex Sustained Attention: Impaired Sustained Attention Impairment: Verbal basic Cognition Arousal/Alertness: Awake/alert Behavior During Therapy: WFL for tasks assessed/performed Overall Cognitive Status: Impaired/Different from baseline Area of Impairment: Attention, Following commands, Problem solving, Memory Orientation Level: Disoriented to, Place, Time, Situation Current Attention Level: Selective, Sustained Memory: Decreased short-term memory Following Commands: Follows multi-step commands with increased time, Follows multi-step commands inconsistently Safety/Judgement: Decreased awareness of deficits Problem Solving: Slow processing, Difficulty sequencing, Requires verbal cues General Comments: Pt with requiring increased time and cues throughout. With direct questions, pt answering ~50% - mutiple answers were not intelligable. Pt requiring Mod cues for ST memory to recall which tasks he was asked to perform. Requiring significant time throughout all ADLs Difficult to assess due to: Impaired communication, Level of arousal  Physical Exam: Blood pressure 138/82, pulse 73, temperature 98.9 F (37.2 C), temperature source Oral, resp. rate 14, height 5\' 10"  (1.778 m), weight 119.7 kg, SpO2 95 %. Physical Exam Constitutional:  Appearance: He is obese.  HENT:     Head:     Comments: Craniotomy site clean and dry with staples. Staples over each eyebrow.     Nose: Nose normal.  Eyes:     Conjunctiva/sclera: Conjunctivae normal.     Pupils: Pupils are equal, round, and reactive to light.  Cardiovascular:     Rate and Rhythm: Normal rate and regular rhythm.     Heart sounds: No murmur heard.   No gallop.  Pulmonary:     Effort: Pulmonary effort is normal. No  respiratory distress.     Breath sounds: No wheezing.  Abdominal:     General: Bowel sounds are normal. There is no distension.     Palpations: Abdomen is soft.     Tenderness: There is no abdominal tenderness.  Musculoskeletal:        General: No tenderness.     Cervical back: No rigidity or tenderness.  Skin:    General: Skin is warm.     Findings: No lesion.  Neurological:     Mental Status: He is alert.     Comments: Patient is alert.  No acute distress.  Makes eye contact with examiner. Does track my finger bilaterally to peripheries. Speech and processing are delayed. Oriented to person, [place], day of month (year with cueing). Moves all 4 limbs, right seems to be more delayed than left. 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 Decreased LT/PP in stocking glove distribution below the knees and in the palm of each hand. No abnormal resting tone.   Psychiatric:     Comments: Flat but cooperative.     Results for orders placed or performed during the hospital encounter of 10/15/21 (from the past 48 hour(s))  Glucose, capillary     Status: Abnormal   Collection Time: 10/25/21  7:52 AM  Result Value Ref Range   Glucose-Capillary 224 (H) 70 - 99 mg/dL    Comment: Glucose reference range applies only to samples taken after fasting for at least 8 hours.  Glucose, capillary     Status: Abnormal   Collection Time: 10/25/21 12:20 PM  Result Value Ref Range   Glucose-Capillary 360 (H) 70 - 99 mg/dL    Comment: Glucose reference range applies only to samples taken after fasting for at least 8 hours.  Glucose, capillary     Status: Abnormal   Collection Time: 10/25/21  3:30 PM  Result Value Ref Range   Glucose-Capillary 227 (H) 70 - 99 mg/dL    Comment: Glucose reference range applies only to samples taken after fasting for at least 8 hours.  Glucose, capillary     Status: Abnormal   Collection Time: 10/25/21  8:11 PM  Result Value Ref Range   Glucose-Capillary 353 (H) 70 - 99 mg/dL     Comment: Glucose reference range applies only to samples taken after fasting for at least 8 hours.   Comment 1 Notify RN   Glucose, capillary     Status: Abnormal   Collection Time: 10/25/21 11:19 PM  Result Value Ref Range   Glucose-Capillary 293 (H) 70 - 99 mg/dL    Comment: Glucose reference range applies only to samples taken after fasting for at least 8 hours.  Glucose, capillary     Status: Abnormal   Collection Time: 10/26/21  3:29 AM  Result Value Ref Range   Glucose-Capillary 260 (H) 70 - 99 mg/dL    Comment: Glucose reference range applies only to samples taken after fasting for at least  8 hours.  Glucose, capillary     Status: Abnormal   Collection Time: 10/26/21  7:24 AM  Result Value Ref Range   Glucose-Capillary 240 (H) 70 - 99 mg/dL    Comment: Glucose reference range applies only to samples taken after fasting for at least 8 hours.  Basic metabolic panel     Status: Abnormal   Collection Time: 10/26/21 10:00 AM  Result Value Ref Range   Sodium 136 135 - 145 mmol/L   Potassium 3.7 3.5 - 5.1 mmol/L   Chloride 99 98 - 111 mmol/L   CO2 22 22 - 32 mmol/L   Glucose, Bld 398 (H) 70 - 99 mg/dL    Comment: Glucose reference range applies only to samples taken after fasting for at least 8 hours.   BUN 33 (H) 8 - 23 mg/dL   Creatinine, Ser 1.37 (H) 0.61 - 1.24 mg/dL   Calcium 9.4 8.9 - 10.3 mg/dL   GFR, Estimated 57 (L) >60 mL/min    Comment: (NOTE) Calculated using the CKD-EPI Creatinine Equation (2021)    Anion gap 15 5 - 15    Comment: Performed at [facility] [address], [place], [place] [address]  CBC     Status: Abnormal   Collection Time: 10/26/21 10:00 AM  Result Value Ref Range   WBC 13.8 (H) 4.0 - 10.5 K/uL   RBC 4.88 4.22 - 5.81 MIL/uL   Hemoglobin 15.5 13.0 - 17.0 g/dL   HCT 45.8 39.0 - 52.0 %   MCV 93.9 80.0 - 100.0 fL   MCH 31.8 26.0 - 34.0 pg   MCHC 33.8 30.0 - 36.0 g/dL   RDW 13.5 11.5 - 15.5 %   Platelets 246 150 - 400 K/uL   nRBC 0.0  0.0 - 0.2 %    Comment: Performed at [facility], [place] [address], [place], [place] [address]  Glucose, capillary     Status: Abnormal   Collection Time: 10/26/21 11:43 AM  Result Value Ref Range   Glucose-Capillary 338 (H) 70 - 99 mg/dL    Comment: Glucose reference range applies only to samples taken after fasting for at least 8 hours.  Glucose, capillary     Status: Abnormal   Collection Time: 10/26/21  4:55 PM  Result Value Ref Range   Glucose-Capillary 331 (H) 70 - 99 mg/dL    Comment: Glucose reference range applies only to samples taken after fasting for at least 8 hours.  Glucose, capillary     Status: Abnormal   Collection Time: 10/26/21 10:02 PM  Result Value Ref Range   Glucose-Capillary 361 (H) 70 - 99 mg/dL    Comment: Glucose reference range applies only to samples taken after fasting for at least 8 hours.   CT HEAD WO CONTRAST (5MM)  Result Date: 10/27/2021 CLINICAL DATA:  66 year old male postoperative day 7 right posterior temporal lobe craniotomy and tumor resection. Pathology pending. EXAM: CT HEAD WITHOUT CONTRAST TECHNIQUE: Contiguous axial images were obtained from the base of the skull through the vertex without intravenous contrast. RADIATION DOSE REDUCTION: This exam was performed according to the departmental dose-optimization program which includes automated exposure control, adjustment of the mA and/or kV according to patient size and/or use of iterative reconstruction technique. COMPARISON:  Postoperative head CT 10/21/2021 and earlier. FINDINGS: Brain: Posterior left temporal lobe resection cavity containing some gas and fluid has not significantly changed in size or configuration since 10/21/2021. Small round foci of regional hemorrhage are also  stable (series 3, images 19 and 22, up to 13 mm individually). Regional white matter hypodensity appears mildly regressed. Trace left side subdural pneumocephalus persists. Stable trace rightward midline shift. Mild  mass effect on the atrium of the left lateral ventricle is stable. No ventriculomegaly. Basilar cisterns remain patent. No new intracranial hemorrhage or evidence of cortically based acute infarction. Vascular: Mild Calcified atherosclerosis at the skull base. Skull: Stable left lateral craniotomy. No acute osseous abnormality identified. Sinuses/Orbits: Visualized paranasal sinuses and mastoids are stable and well aerated. Other: Evolving postoperative changes to the left scalp. Skin staples remain in place. Negative orbits soft tissues. IMPRESSION: 1. Essentially stable CT appearance of the posterior left temporal resection. Stable resection cavity with small volume adjacent parenchymal blood. Unchanged mild regional mass effect. 2. No new intracranial abnormality. Electronically Signed   By: Genevie Ann M.D.   On: 10/27/2021 04:50      Blood pressure 138/82, pulse 73, temperature 98.9 F (37.2 C), temperature source Oral, resp. rate 14, height 5\' 10"  (1.778 m), weight 119.7 kg, SpO2 95 %.  Medical Problem List and Plan: 1. 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  Status post right craniotomy resection of tumor worrisome for glioblastoma 10/20/2021.  -DECADRON TAPER.  Medical oncology Dr. Mickeal Skinner to follow-up on plan of care  -patient may shower  -ELOS/Goals: 10-12 days, supervision to min assist goals 2.  Antithrombotics: -DVT/anticoagulation: Awaiting plan from neurosurgery when chronic Eliquis can be resumed.  -antiplatelet therapy: N/A 3. Pain Management: Oxycodone as needed 4. Mood: Provide emotional support  -antipsychotic agents: Seroquel 12.5 mg nightly 5. Neuropsych: This patient is capable of making decisions on his own behalf. 6. Skin/Wound Care: Routine skin checks  -continue staples for now 7. Fluids/Electrolytes/Nutrition: Routine in and outs with follow-up chemistries 8.  Seizure prophylaxis.  Keppra 1000 mg twice daily 9.  History of CAD.  Plavix remains on hold.  No  chest pain or shortness of breath.  Continue Imdur 30 mg daily and nitroglycerin as needed 10.  Diabetes mellitus.  Hemoglobin A1c 9.6.  NovoLog 8 units 3 times daily, Semglee 28 units twice daily. 11.  COPD.  Oxygen dependent 2 L prior to admission.  Continue nebulizers as directed 12.  Hypertension.  Lopressor 100 mg twice daily    Cathlyn Parsons, PA-C 10/27/2021

## 2021-10-26 NOTE — Progress Notes (Signed)
Progress Note   Patient: Jeff Aken Sr. JKD:[id number] DOB: 06-Aug-1956 DOA: 10/15/2021     10 DOS: the patient was seen and examined on 10/26/2021   Brief hospital course: 66 year old M with PMH of CAD/remote stent on Plavix, chronic RF on 2 L, COPD, chronic pain on opiate, extensive DVT on Eliquis, DM-2, HTN, HLD and morbid obesity presenting with acute onset confusion and aphasia with concern for CVA.  Reportedly took 2 of the 200 mg trazodone and he is the pain medication prior to arrival.  UDS negative.  He tested positive for COVID-19 but symptomatic for over 2 weeks.   CT head concerning for 3 to 4 cm region of subacute infarction versus mass in left posterior temporal lobe.  Neurology consulted.  He was started on Keppra 500 mg twice daily. Spot and LTM EEG without seizure or epileptiform discharge.    CT angio head and neck and CT cerebral perfusion with contrast without LVO but showed 2.5 cm hyperemic mass in left posterior temporal lobe.   MRI brain without contrast also showed mass lesion of the left temporal lobe with associated edema but truncated exam due to patient's inability to tolerate setting of mental status change.  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    Neurosurgery consulted and started Decadron.  Plan for left temporal craniotomy and resection of tumor on 2/21.  Neurology, neurosurgery neurology and neurosurgery following.  Neurology and neurooncology, Dr. Mickeal Skinner recommended CT chest/abdomen/pelvis to exclude for possible primary elsewhere - appears cancelled due to allergy...will defer to Dr. Mickeal Skinner to order if needed  Mental status improved Plan for CIR when bed   Assessment and Plan: * Acute toxic metabolic encephalopathy with aphasia with new left temporal mass- (present on  admission) Acute onset.  Does not seem to have other focal neuro deficit but limited exam due to confusion.  -No seizure activity since admission.   -Spot and LTM EEG negative for seizure.  -UDS, EtOH and TSH negative.  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 -Continue Keppra 500 mg twice daily- will discuss with neurology in case keppra is worsening delirium/behavior -has not been getting seroquel at night so will d/c -IV Haldol schedule-- will d/c and try nightly seroquel- wean to off as recovers -Seizure, fall, delirium and aspiration precaution  -Minimize sedating medications -SLP/PT/OT eval- CIR consulted  Paroxysmal atrial fibrillation with RVR (HCC) -on BB -responded to cardizem bolus -elquis on hold until ok with NS  Agitation Seen encephalopathy above -improved with IV haldol-- will d/c and monitor-- seroquel QHS for now  Mass of left temporal lobe Multiple imaging showed 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 -CT chest/abdomen/pelvis not done---Neuro oncology, Dr. Mickeal Skinner consulted as well -path pending   Hypokalemia Resolved.  COPD with chronic hypoxic respiratory failure On 2.5 L by [place] at baseline at night--  -steroid neb -DuoNeb as needed  Hyperlipidemia - Atorvastatin as above.  Morbid obesity ([place]) Estimated body mass index is 37.86 kg/m as calculated from the following:   Height as of this encounter: 5\' 10"  (1.778 m).   Weight as of this encounter: 119.7 kg.  Sinus bradycardia- (present on admission) Likely due  to beta-blocker.  He was on metoprolol 100 mg twice daily at home PO BB -Continue telemetry monitoring  Lab test positive for detection of COVID-19 virus- (present on admission) Previous attending discussed with lab (CT value 38.1).  Per wife,  patient has received initial 2 doses of COVID-vaccine and 1 booster shot.  He started having cough and congestion 2 weeks ago.  Chronically uses 2.5 L supplemental oxygen and no change in oxygen requirement from baseline.  CXR without acute finding.  Inflammatory markers within normal. -d/c precautions    Diabetes mellitus type 2, noninsulin dependent (HCC) Uncontrolled NIDDM-2 with hyperglycemia and other complications: X3K 4.4%.  Seems he is only on glipizide at home.  Hyperglycemia likely due to steroid. -adjust medications as able    Essential hypertension Stable. -Cardiac meds as above.  History of DVT (deep vein thrombosis) -Hold Eliquis- resume when ok with NS (POD #7?)  CAD S/P percutaneous coronary angioplasty No anginal symptoms. -on BB -Continue holding Hyzaar. -PRN demadex -Hold Plavix and Eliquis- resume when ok with NS   Headache -CT scan 2/27 pending    Subjective: no SOB, no CP- c/o headache  Physical Exam: Vitals:   10/26/21 0330 10/26/21 0729 10/26/21 0848 10/26/21 1205  BP: (!) 145/96 (!) 162/96  140/90  Pulse: 64 76 85 79  Resp: 13 15 17 19   Temp: 98.9 F (37.2 C) 98.7 F (37.1 C)  98.6 F (37 C)  TempSrc: Oral Oral  Oral  SpO2: 99% 99% 100% 99%  Weight:      Height:        General: Appearance:    Obese male in no acute distress     Lungs:     respirations unlabored  Heart:    Normal heart rate.   MS:   All extremities are intact.    Neurologic:   Awake, alert- slowly improving     Data Reviewed: Cr up BS up   Disposition: Status is: Inpatient Remains inpatient appropriate because: needs CIR when bed available          Planned Discharge Destination: Rehab   Time spent: 55 minutes  Author: Geradine Girt, DO 10/26/2021 1:27 PM  For on call review [email or web address].

## 2021-10-26 NOTE — Consult Note (Signed)
Consultation Note Date: 10/26/2021   Patient Name: Jeff Wood.  DOB: Jun 01, 1956  MRN: [id number]  Age / Sex: 66 y.o., male  PCP: Pcp, No Referring Physician: Geradine Girt, DO  Reason for Consultation: Establishing goals of care and Psychosocial/spiritual support  HPI/Patient Profile: 66 y.o. 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    UDS negative.     CT head concerning for 3 to 4 cm region of subacute infarction versus mass in left posterior temporal lobe.  Neurology consulted.  He was started on Keppra 500 mg twice daily. Spot and LTM EEG without seizure or epileptiform discharge.     CT angio head and neck and CT cerebral perfusion with contrast without LVO but showed 2.5 cm hyperemic mass in left posterior temporal lobe.    MRI brain without contrast also showed mass lesion of the left temporal lobe with associated edema but truncated exam due to patient's inability to tolerate setting of mental status change.   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     Neurosurgery consulted and started Decadron. left temporal craniotomy and resection of tumor on 2/21.     Follow-up cranial CT scan 10/26/2021 showed stable resection cavity with small volume adjacent parenchymal blood unchanged mild regional mass effect no new intracranial abnormality..  Pathology report pending to be presented to tumor board and Dr. Mickeal Skinner from medical oncology to follow-up.      Progressing well after surgery    Plan is for CIR for  continued rehabilitation  Patient and his family face ongoing treatment option decisions, advanced directive decisions and anticipatory care needs.     Clinical Assessment and Goals of Care:  This NP Wadie Lessen reviewed medical records, received report from team, assessed the patient and then meet at the patient's bedside with his wife and daughter/Crystal  to discuss diagnosis, prognosis, GOC, disposition and options.   Concept of Palliative Care was introduced as specialized medical care for people and their families living with serious illness.  If focuses on providing relief from the symptoms and stress of a serious illness.  The goal is to improve quality of life for both the patient and the family.  Values and goals of care important to patient and family were attempted to be elicited.  Created space and opportunity for patient  and family to explore thoughts and feelings regarding current medical situation.  Wife understands the seriousness of the current medical situation.  Wife is very engaged in today's conversation and active in his care, she wants only "the best for him." She wants to be able to stay with him during the night.  She and her husband moved to [place] only 3 weeks ago to live with their daughter, she is grateful to be here.  Patient and his wife have been married for 40 plus years. Family is originally from [place]. I believe there are 5 children total.   PMT will continue to support holistically.     Questions and concerns addressed.   Family encouraged to call with questions or concerns.              No documented HPOA or ACP documents noted.  Wife is next of kin and main decision maker.  She tells me there are documents and she will bring them in for scanning       SUMMARY OF RECOMMENDATIONS    Code Status/Advance Care Planning: Full code    Palliative Prophylaxis:  Aspiration, Delirium Protocol, Frequent Pain Assessment, and Oral  Care  Additional Recommendations (Limitations, Scope, Preferences): Full Scope Treatment  Psycho-social/Spiritual:  Desire for further Chaplaincy support:no/declined- daughter is a minister  Prognosis:  Unable to determine  Discharge Planning: To Be Determined      Primary Diagnoses: Present on Admission:  Lab test positive for detection of COVID-19 virus  Sinus bradycardia  Acute toxic metabolic encephalopathy with aphasia with new left temporal mass   I have reviewed the medical record, interviewed the patient and family, and examined the patient. The following aspects are pertinent.  Past Medical History:  Diagnosis Date   Morbid obesity ([place]) 10/17/2021   Social History   Socioeconomic History   Marital status: Married    Spouse name: Not on file   Number of children: Not on file   Years of education: Not on file   Highest education level: Not on file  Occupational History   Not on file  Tobacco Use   Smoking status: Unknown   Smokeless tobacco: Not on file  Vaping Use   Vaping Use: Never used  Substance and Sexual Activity   Alcohol use: Not on file   Drug use: Not on file   Sexual activity: Not on file  Other Topics Concern   Not on file  Social History Narrative   Not on file   Social Determinants of Health   Financial Resource Strain: Not on file  Food Insecurity: Not on file  Transportation Needs: Not on file  Physical Activity: Not on file  Stress: Not on file  Social Connections: Not on file   History reviewed. No pertinent family history. Scheduled Meds:  budesonide (PULMICORT) nebulizer solution  0.25 mg Nebulization BID   Chlorhexidine Gluconate Cloth  6 each Topical Daily   dexamethasone  2 mg Oral Q6H   insulin aspart  0-15 Units Subcutaneous TID WC   insulin aspart  0-5 Units Subcutaneous QHS   insulin aspart  8 Units Subcutaneous TID WC   insulin glargine-yfgn  28 Units Subcutaneous BID   isosorbide mononitrate  30 mg Oral Daily    levETIRAcetam  1,000 mg Oral BID   metoprolol tartrate  100 mg Oral BID   montelukast  10 mg Oral QHS   pantoprazole  40 mg Oral Daily   polyethylene glycol  17 g Oral BID   QUEtiapine  12.5 mg Oral QHS   Continuous Infusions: PRN Meds:.acetaminophen **OR** acetaminophen, bisacodyl, hydrALAZINE, HYDROmorphone (DILAUDID) injection, ipratropium-albuterol, nitroGLYCERIN, ondansetron **OR** ondansetron (ZOFRAN) IV, oxyCODONE-acetaminophen Medications Prior to Admission:  Prior to Admission medications   Medication Sig Start Date End Date Taking? Authorizing Provider  acetaminophen (TYLENOL) 500 MG tablet  Take 500 mg by mouth every 6 (six) hours as needed for headache.   Yes [provider]  albuterol (VENTOLIN HFA) 108 (90 Base) MCG/ACT inhaler Inhale 1-2 puffs into the lungs every 6 (six) hours as needed for wheezing or shortness of breath.   Yes [provider]  apixaban (ELIQUIS) 5 MG TABS tablet Take 5 mg by mouth 2 (two) times daily.   Yes [provider]  atorvastatin (LIPITOR) 40 MG tablet Take 40 mg by mouth daily.   Yes [provider]  budesonide (PULMICORT) 0.5 MG/2ML nebulizer solution Take 0.5 mg by nebulization 2 (two) times daily as needed (wheezing).   Yes [provider]  Cholecalciferol (VITAMIN D3 GUMMIES PO) Take 2 tablets by mouth every evening.   Yes [provider]  clopidogrel (PLAVIX) 75 MG tablet Take 75 mg by mouth daily.   Yes [provider]  famotidine (PEPCID) 20 MG tablet Take 20 mg by mouth daily.   Yes [provider]  fluticasone (FLONASE) 50 MCG/ACT nasal spray Place 2 sprays into both nostrils in the morning and at bedtime.   Yes [provider]  FLUTICASONE FUROATE-VILANTEROL IN Inhale 1 puff into the lungs daily.   Yes [provider]  Fluticasone-Umeclidin-Vilant (TRELEGY ELLIPTA IN) Inhale 1 puff into the lungs daily.   Yes [provider]  gabapentin  (NEURONTIN) 300 MG capsule Take 300 mg by mouth 3 (three) times daily.   Yes [provider]  gemfibrozil (LOPID) 600 MG tablet Take 600 mg by mouth 2 (two) times daily before a meal.   Yes [provider]  glipiZIDE (GLUCOTROL XL) 5 MG 24 hr tablet Take 5 mg by mouth in the morning and at bedtime.   Yes [provider]  ipratropium-albuterol (DUONEB) 0.5-2.5 (3) MG/3ML SOLN Take 3 mLs by nebulization every 6 (six) hours as needed (shortness of breath).   Yes [provider]  isosorbide mononitrate (IMDUR) 30 MG 24 hr tablet Take 30 mg by mouth daily.   Yes [provider]  losartan-hydrochlorothiazide (HYZAAR) 100-12.5 MG tablet Take 1 tablet by mouth daily.   Yes [provider]  metoprolol tartrate (LOPRESSOR) 100 MG tablet Take 100 mg by mouth 2 (two) times daily.   Yes [provider]  montelukast (SINGULAIR) 10 MG tablet Take 10 mg by mouth every evening.   Yes [provider]  Multiple Vitamins-Minerals (MENS MULTIVITAMIN PO) Take 1 tablet by mouth daily.   Yes [provider]  nitroGLYCERIN (NITROSTAT) 0.4 MG SL tablet Place 0.4 mg under the tongue every 5 (five) minutes as needed for chest pain.   Yes [provider]  oxyCODONE-acetaminophen (PERCOCET) 10-325 MG tablet Take 1 tablet by mouth 4 (four) times daily as needed for pain.   Yes [provider]  pantoprazole (PROTONIX) 40 MG tablet Take 40 mg by mouth daily.   Yes [provider]  tiZANidine (ZANAFLEX) 4 MG tablet Take 4 mg by mouth every 8 (eight) hours as needed for muscle spasms.   Yes [provider]  torsemide (DEMADEX) 20 MG tablet Take 20 mg by mouth daily.   Yes [provider]  traZODone (DESYREL) 100 MG tablet Take 200 mg by mouth at bedtime as needed for sleep.   Yes [provider]   Allergies  Allergen Reactions   Iodine Anaphylaxis   Shellfish Allergy Anaphylaxis   Review of  Systems  Unable to perform ROS: Mental status change   Physical Exam  Constitutional:      Appearance: He is normal weight. He is ill-appearing.  Cardiovascular:     Rate and Rhythm: Normal rate.  Pulmonary:     Effort: Pulmonary effort is normal.  Skin:    General: Skin is warm and dry.  Neurological:     Mental Status: He is lethargic.    Vital Signs: BP 140/90 (BP Location: Right Arm)    Pulse 79    Temp 98.6 F (37 C) (Oral)    Resp 19    Ht 5\' 10"  (1.778 m)    Wt 119.7 kg    SpO2 99%    BMI 37.86 kg/m  Pain Scale: 0-10   Pain Score: Asleep   SpO2: SpO2: 99 % O2 Device:SpO2: 99 % O2 Flow Rate: .O2 Flow Rate (L/min): 2 L/min  IO: Intake/output summary: No intake or output data in the 24 hours ending 10/26/21 1430  LBM: Last BM Date : 10/22/21 Baseline Weight: Weight: 123.6 kg Most recent weight: Weight: 119.7 kg     Palliative Assessment/Data:     Signed by: Wadie Lessen, NP   Please contact Palliative Medicine Team phone at [phone] for questions and concerns.  For individual provider: See Shea Evans

## 2021-10-26 NOTE — Care Management Important Message (Signed)
Important Message  Patient Details  Name: Jeff Quintanar Sr. MRN: [id number] Date of Birth: 11/29/1955   Medicare Important Message Given:  Yes     Jeff Wood 10/26/2021, 12:18 PM

## 2021-10-26 NOTE — Progress Notes (Signed)
Patient continues to slowly improve.  He is much more fluent.  He has noted some increased headache today but currently states his headache is not bad.  He is afebrile.  He is mildly hypertensive.  Otherwise vitals are stable.  Urine output is good.  He awakens easily.  His speech is reasonably fluent.  He follows commands bilaterally.  Strength is equal bilaterally.  Overall I think the patient is making good steady progress.  He may be having somewhat more headache than he has had through the weekend however and I think getting a follow-up scan is reasonable.

## 2021-10-26 NOTE — Progress Notes (Addendum)
Inpatient Rehabilitation Admissions Coordinator  ° °I may have a CIR bed today vs tomorrow depending on a discharge from CIR today. I met with patient and his wife at bedside and they are aware and in agreement. I have contacted acute team and TOC. ° ° , RN, MSN °Rehab Admissions Coordinator °[phone] °10/26/2021 11:34 AM ° ° °Admission to CIR will be Tuesday. I contacted his daughter, Crystal by phone and she is aware. ° ° , RN, MSN °Rehab Admissions Coordinator °[phone] °10/26/2021 2:35 PM ° °

## 2021-10-26 NOTE — Progress Notes (Incomplete)
Inpatient Rehabilitation Admission Medication Review by a Pharmacist  A complete drug regimen review was completed for this patient to identify any potential clinically significant medication issues.  High Risk Drug Classes Is patient taking? Indication by Medication  Antipsychotic Yes Seroquel- sleep  Anticoagulant Yes Apixaban- extensive DVT  Antibiotic No   Opioid Yes Percocet- acute pain  Antiplatelet No   Hypoglycemics/insulin Yes Semglee, iSS- T2DM  Vasoactive Medication Yes Imdur, NitroSL, lopressor- angina, hypertension  Chemotherapy No   Other Yes Keppra- seizure prevention Singulair- asthma Protonix- GERD     Type of Medication Issue Identified Description of Issue Recommendation(s)  Drug Interaction(s) (clinically significant)     Duplicate Therapy     Allergy     No Medication Administration End Date     Incorrect Dose     Additional Drug Therapy Needed     Significant med changes from prior encounter (inform family/care partners about these prior to discharge).    Other  PTA meds Vitamin D-3 Plavix Pepcid Flonase Trelegy Gabapentin Lopid Glucotrol Hyzaar zanaflex demadex trazodone Restart PTA meds when and if clinically necessary during CIR admission or at time of discharge if warranted    Clinically significant medication issues were identified that warrant physician communication and completion of prescribed/recommended actions by midnight of the next day:  No  Name of provider notified for urgent issues identified:   Provider Method of Notification:     Pharmacist comments:   Time spent performing this drug regimen review (minutes):  30   Corah Willeford BS, PharmD, BCPS Clinical Pharmacist 10/26/2021 12:28 PM

## 2021-10-26 NOTE — Progress Notes (Signed)
Inpatient Diabetes Program Recommendations  AACE/ADA: New Consensus Statement on Inpatient Glycemic Control (2015)  Target Ranges:  Prepandial:   less than 140 mg/dL      Peak postprandial:   less than 180 mg/dL (1-2 hours)      Critically ill patients:  140 - 180 mg/dL   Lab Results  Component Value Date   GLUCAP 338 (H) 10/26/2021   HGBA1C 9.5 (H) 10/16/2021    Review of Glycemic Control  Latest Reference Range & Units 10/26/21 03:29 10/26/21 07:24 10/26/21 11:43  Glucose-Capillary 70 - 99 mg/dL 260 (H) 240 (H) 338 (H)  (H): Data is abnormally high Diabetes history: DM2 Outpatient Diabetes medications: Glipizide XL 5 mg BID Current orders for Inpatient glycemic control: Semglee 24 units BID, Novolog 0-15 units TID with meals, Novolog 0-5 units QHS, Novolog 4 units TID with meals; Decadron 2 mg Q6H   Inpatient Diabetes Program Recommendations:     If steroids are continued consider increasing -Semglee to 28 units BID  -Novolog 8 units TID (assuming patient is consuming >50% of meals).   Thanks, Bronson Curb, MSN, RNC-OB Diabetes Coordinator [phone] (8a-5p)

## 2021-10-26 NOTE — Progress Notes (Signed)
Occupational Therapy Treatment Patient Details Name: Jeff Mazzocco Sr. MRN: [id number] DOB: Jan 04, 1956 Today's Date: 10/26/2021   History of present illness 66 year old male with acute onset of aphasia and confusion. 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 Now s/p L craniotomy on 10/20/21 Patient's medical history includes type 2 diabetes mellitus, coronary artery disease and a remote history of DVT still being treated on Eliquis and Plavix.   OT comments  Pt continues to present with decreased cognition, balance, and strength. Despite drowsiness from sitting in recliner, pt very motivated to participate in therapy. Pt performing functional mobility to/from bathroom to complete oral care at sink. Pt requiring Min A for standing balance at sink and Mod cues for cognition. Pt also presenting with decreased strength and coordination at [place]. Pt requiring seated rest break after grooming; pt with decreased awareness to recognize his BLEs were fatigues from standing (despite shaking/tremors at BLE). Continue to high recommend dc to AIR and will continue to follow acutely as admitted.    Recommendations for follow up therapy are one component of a multi-disciplinary discharge planning process, led by the attending physician.  Recommendations may be updated based on patient status, additional functional criteria and insurance authorization.    Follow Up Recommendations  Acute inpatient rehab (3hours/day)    Assistance Recommended at Discharge Frequent or constant Supervision/Assistance  Patient can return home with the following  A lot of help with walking and/or transfers;A lot of help with bathing/dressing/bathroom;Direct supervision/assist for medications management;Direct supervision/assist for financial management;Assist for transportation;Help with stairs or ramp for  entrance;Assistance with cooking/housework   Equipment Recommendations  Other (comment) (RW)    Recommendations for Other Services Other (comment) (psych consult for meds to manage behavior)    Precautions / Restrictions Precautions Precautions: Fall Restrictions Weight Bearing Restrictions: No       Mobility Bed Mobility Overal bed mobility: Needs Assistance Bed Mobility: Sit to Supine       Sit to supine: Mod assist   General bed mobility comments: Mod A for elevating BELs over EOB. Requiring assistance to reposition    Transfers Overall transfer level: Needs assistance Equipment used: Rolling walker (2 wheels) Transfers: Sit to/from Stand Sit to Stand: Min assist     Step pivot transfers: Min assist, +2 physical assistance     General transfer comment: up to stand from [facility] with min A for balance,     Balance Overall balance assessment: Needs assistance Sitting-balance support: Feet supported Sitting balance-Leahy Scale: Poor     Standing balance support: Bilateral upper extremity supported Standing balance-Leahy Scale: Poor                             ADL either performed or assessed with clinical judgement   ADL Overall ADL's : Needs assistance/impaired     Grooming: Minimal assistance;Standing Grooming Details (indicate cue type and reason): Min A for standing balance at sink. Pt also quickly fatigues and required seated rest break. Pt requiring Min cues for sequencing. Dropping grooming items as hand strength is poor                 Toilet Transfer: Minimal assistance;+2 for physical assistance;+2 for safety/equipment;Ambulation;BSC/3in1;Rolling walker (2 wheels)           Functional mobility during ADLs: Minimal assistance;+2 for physical assistance;Rolling walker (2 wheels) General ADL Comments: Pt  continues to present with decreased balance, strength, and cognition    Extremity/Trunk Assessment Upper Extremity  Assessment Upper Extremity Assessment: LUE deficits/detail;RUE deficits/detail RUE Deficits / Details: Limited shoulder ROM at RUE. Difficulty reaching towards sink in standing and using compensatory hiking of the shoulder to raech forward. Decreased grasp strength RUE Coordination: decreased fine motor;decreased gross motor LUE Deficits / Details: decreased grasp strength LUE Coordination: decreased fine motor;decreased gross motor   Lower Extremity Assessment Lower Extremity Assessment: Defer to PT evaluation        Vision   Vision Assessment?:  (wears readers)   Perception     Praxis Praxis Praxis: Impaired Praxis Impairment Details: Initiation;Motor planning    Cognition Arousal/Alertness: Awake/alert Behavior During Therapy: WFL for tasks assessed/performed Overall Cognitive Status: Impaired/Different from baseline Area of Impairment: Attention, Following commands, Problem solving, Memory                   Current Attention Level: Selective, Sustained Memory: Decreased short-term memory Following Commands: Follows multi-step commands with increased time, Follows multi-step commands inconsistently Safety/Judgement: Decreased awareness of deficits   Problem Solving: Slow processing, Difficulty sequencing, Requires verbal cues General Comments: Pt with requiring increased time and cues throughout. With direct questions, pt answering ~50% - mutiple answers were not intelligable. Pt requiring Mod cues for ST memory to recall which tasks he was asked to perform. Requiring significant time throughout all ADLs        Exercises      Shoulder Instructions       General Comments VSS    Pertinent Vitals/ Pain       Pain Assessment Pain Assessment: Faces Faces Pain Scale: Hurts little more Pain Location: ankles/neuropathy Pain Descriptors / Indicators: Discomfort Pain Intervention(s): Monitored during session, Limited activity within patient's tolerance,  Repositioned  Home Living                                          Prior Functioning/Environment              Frequency  Min 2X/week        Progress Toward Goals  OT Goals(current goals can now be found in the care plan section)  Progress towards OT goals: Progressing toward goals  Acute Rehab OT Goals OT Goal Formulation: With family Time For Goal Achievement: 11/04/21 Potential to Achieve Goals: Fair ADL Goals Pt Will Perform Grooming: with set-up;with supervision;sitting Pt Will Perform Upper Body Bathing: with supervision;with set-up;sitting Pt Will Perform Lower Body Bathing: with min assist;sit to/from stand Pt Will Transfer to Toilet: with min guard assist;ambulating Pt Will Perform Toileting - Clothing Manipulation and hygiene: with min guard assist Additional ADL Goal #1: Pt will demosntrate sustained attention to task without redirectional cues in minimally distracting environment  Plan Discharge plan remains appropriate    Co-evaluation                 AM-PAC OT "6 Clicks" Daily Activity     Outcome Measure   Help from another person eating meals?: A Lot Help from another person taking care of personal grooming?: A Lot Help from another person toileting, which includes using toliet, bedpan, or urinal?: Total Help from another person bathing (including washing, rinsing, drying)?: A Lot Help from another person to put on and taking off regular upper body clothing?: A Lot Help from another person to put on and  taking off regular lower body clothing?: Total 6 Click Score: 10    End of Session Equipment Utilized During Treatment: Gait belt;Rolling walker (2 wheels)  OT Visit Diagnosis: Unsteadiness on feet (R26.81);Other abnormalities of gait and mobility (R26.89);Muscle weakness (generalized) (M62.81);Other symptoms and signs involving cognitive function;Pain Pain - part of body:  (butt)   Activity Tolerance Patient tolerated  treatment well   Patient Left with call bell/phone within reach;in bed;with bed alarm set   Nurse Communication Mobility status        Time: 8022-3361 OT Time Calculation (min): 29 min  Charges: OT General Charges $OT Visit: 1 Visit OT Treatments $Self Care/Home Management : 23-37 mins  [place], OTR/L Acute Rehab Pager: [phone] Office: [place] 10/26/2021, 4:10 PM

## 2021-10-26 NOTE — Progress Notes (Signed)
Physical Therapy Treatment Patient Details Name: Jeff Hausman Sr. MRN: [id number] DOB: December 02, 1955 Today's Date: 10/26/2021   History of Present Illness 66 year old male with acute onset of aphasia and confusion. 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 Now s/p L craniotomy on 10/20/21 Patient's medical history includes type 2 diabetes mellitus, coronary artery disease and a remote history of DVT still being treated on Eliquis and Plavix.    PT Comments    Patient progressing with ambulation and cognition with improved awareness of surroundings, focusing on task and completing with minimal cues.  He ws working on adjusting the walker when on [facility] but needed help due to over lengthening.  Patient still with word finding difficulties, but a little more aware.  He remains appropriate for acute inpatient rehab at d\c.  PT will continue to follow.   Recommendations for follow up therapy are one component of a multi-disciplinary discharge planning process, led by the attending physician.  Recommendations may be updated based on patient status, additional functional criteria and insurance authorization.  Follow Up Recommendations  Acute inpatient rehab (3hours/day)     Assistance Recommended at Discharge Frequent or constant Supervision/Assistance  Patient can return home with the following Assistance with cooking/housework;Direct supervision/assist for medications management;Help with stairs or ramp for entrance;A little help with walking and/or transfers;A little help with bathing/dressing/bathroom   Equipment Recommendations  Rolling walker (2 wheels)    Recommendations for Other Services       Precautions / Restrictions Precautions Precautions: Fall     Mobility  Bed Mobility               General bed mobility comments: up on BSC    Transfers Overall transfer  level: Needs assistance Equipment used: Rolling walker (2 wheels) Transfers: Sit to/from Stand Sit to Stand: Min assist           General transfer comment: up to stand from [facility] with min A for balance, practiced sit <> stand x 3 with cues and assist for safety with min A    Ambulation/Gait Ambulation/Gait assistance: Min assist, Mod assist Gait Distance (Feet): 120 Feet Assistive device: Rolling walker (2 wheels) Gait Pattern/deviations: Step-through pattern, Decreased stride length, Wide base of support       General Gait Details: mod cues for direction, assist for balance and walker management at times; able to perform cognitive tasks with ambulation noting seasonal decor and trying to state the holiday as well as finding room number when 3 rooms away   Stairs             Wheelchair Mobility    Modified Rankin (Stroke Patients Only)       Balance                                            Cognition Arousal/Alertness: Awake/alert Behavior During Therapy: WFL for tasks assessed/performed Overall Cognitive Status: Impaired/Different from baseline Area of Impairment: Attention, Following commands                   Current Attention Level: Selective   Following Commands: Follows one step commands consistently, Follows multi-step commands consistently, Follows multi-step commands with increased time Safety/Judgement: Decreased awareness of deficits     General Comments: able to state the season and  knew in hospital, able to find room when given the number and 3 rooms away        Exercises Other Exercises Other Exercises: sit<>stand x 3    General Comments        Pertinent Vitals/Pain Pain Assessment Faces Pain Scale: Hurts little more Pain Location: ankles/neuropathy Pain Descriptors / Indicators: Discomfort Pain Intervention(s): Monitored during session    Home Living                          Prior Function             PT Goals (current goals can now be found in the care plan section) Progress towards PT goals: Progressing toward goals    Frequency    Min 4X/week      PT Plan Current plan remains appropriate    Co-evaluation              AM-PAC PT "6 Clicks" Mobility   Outcome Measure  Help needed turning from your back to your side while in a flat bed without using bedrails?: A Lot Help needed moving from lying on your back to sitting on the side of a flat bed without using bedrails?: A Lot Help needed moving to and from a bed to a chair (including a wheelchair)?: A Little Help needed standing up from a chair using your arms (e.g., wheelchair or bedside chair)?: A Little Help needed to walk in hospital room?: A Little Help needed climbing 3-5 steps with a railing? : Total 6 Click Score: 14    End of Session Equipment Utilized During Treatment: Gait belt Activity Tolerance: Patient tolerated treatment well Patient left: in chair;with call bell/phone within reach;with family/visitor present Nurse Communication: Mobility status PT Visit Diagnosis: Other abnormalities of gait and mobility (R26.89);Other symptoms and signs involving the nervous system (R29.898);Muscle weakness (generalized) (M62.81)     Time: 1749-4496 PT Time Calculation (min) (ACUTE ONLY): 30 min  Charges:  $Gait Training: 8-22 mins $Therapeutic Activity: 8-22 mins                     Magda Kiel, PT Acute Rehabilitation Services [phone] Office:[phone] 10/26/2021    Reginia Naas 10/26/2021, 3:29 PM

## 2021-10-27 ENCOUNTER — Encounter (HOSPITAL_COMMUNITY): Payer: Self-pay | Admitting: Physical Medicine & Rehabilitation

## 2021-10-27 ENCOUNTER — Other Ambulatory Visit: Payer: Self-pay

## 2021-10-27 ENCOUNTER — Inpatient Hospital Stay (HOSPITAL_COMMUNITY): Payer: Medicare Other

## 2021-10-27 ENCOUNTER — Inpatient Hospital Stay (HOSPITAL_COMMUNITY)
Admission: RE | Admit: 2021-10-27 | Discharge: 2021-11-06 | DRG: 949 | Disposition: A | Discharge status: Home Health | Payer: Medicare Other | Source: Intra-hospital | Attending: Physical Medicine & Rehabilitation | Admitting: Physical Medicine & Rehabilitation

## 2021-10-27 DIAGNOSIS — Z48811 Encounter for surgical aftercare following surgery on the nervous system: Principal | ICD-10-CM

## 2021-10-27 DIAGNOSIS — I2510 Atherosclerotic heart disease of native coronary artery without angina pectoris: Secondary | ICD-10-CM | POA: Diagnosis present

## 2021-10-27 DIAGNOSIS — Z91013 Allergy to seafood: Secondary | ICD-10-CM

## 2021-10-27 DIAGNOSIS — D496 Neoplasm of unspecified behavior of brain: Secondary | ICD-10-CM

## 2021-10-27 DIAGNOSIS — C719 Malignant neoplasm of brain, unspecified: Secondary | ICD-10-CM | POA: Diagnosis present

## 2021-10-27 DIAGNOSIS — I10 Essential (primary) hypertension: Secondary | ICD-10-CM | POA: Diagnosis present

## 2021-10-27 DIAGNOSIS — R4701 Aphasia: Secondary | ICD-10-CM | POA: Diagnosis present

## 2021-10-27 DIAGNOSIS — Z6836 Body mass index (BMI) 36.0-36.9, adult: Secondary | ICD-10-CM

## 2021-10-27 DIAGNOSIS — J449 Chronic obstructive pulmonary disease, unspecified: Secondary | ICD-10-CM | POA: Diagnosis present

## 2021-10-27 DIAGNOSIS — E119 Type 2 diabetes mellitus without complications: Secondary | ICD-10-CM | POA: Diagnosis present

## 2021-10-27 DIAGNOSIS — Z7902 Long term (current) use of antithrombotics/antiplatelets: Secondary | ICD-10-CM

## 2021-10-27 DIAGNOSIS — R482 Apraxia: Secondary | ICD-10-CM | POA: Diagnosis present

## 2021-10-27 DIAGNOSIS — Z7951 Long term (current) use of inhaled steroids: Secondary | ICD-10-CM

## 2021-10-27 DIAGNOSIS — F1120 Opioid dependence, uncomplicated: Secondary | ICD-10-CM | POA: Diagnosis present

## 2021-10-27 DIAGNOSIS — Z888 Allergy status to other drugs, medicaments and biological substances status: Secondary | ICD-10-CM

## 2021-10-27 DIAGNOSIS — Z7984 Long term (current) use of oral hypoglycemic drugs: Secondary | ICD-10-CM

## 2021-10-27 DIAGNOSIS — Z7901 Long term (current) use of anticoagulants: Secondary | ICD-10-CM

## 2021-10-27 DIAGNOSIS — G479 Sleep disorder, unspecified: Secondary | ICD-10-CM | POA: Diagnosis not present

## 2021-10-27 DIAGNOSIS — E785 Hyperlipidemia, unspecified: Secondary | ICD-10-CM | POA: Diagnosis present

## 2021-10-27 DIAGNOSIS — I480 Paroxysmal atrial fibrillation: Secondary | ICD-10-CM | POA: Diagnosis present

## 2021-10-27 DIAGNOSIS — R4782 Fluency disorder in conditions classified elsewhere: Secondary | ICD-10-CM

## 2021-10-27 DIAGNOSIS — N390 Urinary tract infection, site not specified: Secondary | ICD-10-CM | POA: Diagnosis not present

## 2021-10-27 DIAGNOSIS — Z9981 Dependence on supplemental oxygen: Secondary | ICD-10-CM

## 2021-10-27 DIAGNOSIS — Z66 Do not resuscitate: Secondary | ICD-10-CM | POA: Diagnosis not present

## 2021-10-27 DIAGNOSIS — Z79899 Other long term (current) drug therapy: Secondary | ICD-10-CM

## 2021-10-27 DIAGNOSIS — Z86718 Personal history of other venous thrombosis and embolism: Secondary | ICD-10-CM

## 2021-10-27 DIAGNOSIS — G8929 Other chronic pain: Secondary | ICD-10-CM | POA: Diagnosis present

## 2021-10-27 LAB — COMPREHENSIVE METABOLIC PANEL
ALT: 56 U/L — ABNORMAL HIGH (ref 0–44)
AST: 57 U/L — ABNORMAL HIGH (ref 15–41)
Albumin: 3.1 g/dL — ABNORMAL LOW (ref 3.5–5.0)
Alkaline Phosphatase: 74 U/L (ref 38–126)
Anion gap: 11 (ref 5–15)
BUN: 29 mg/dL — ABNORMAL HIGH (ref 8–23)
CO2: 24 mmol/L (ref 22–32)
Calcium: 9.3 mg/dL (ref 8.9–10.3)
Chloride: 101 mmol/L (ref 98–111)
Creatinine, Ser: 1.04 mg/dL (ref 0.61–1.24)
GFR, Estimated: >60 mL/min (ref >60)
Glucose, Bld: 222 mg/dL — ABNORMAL HIGH (ref 70–99)
Potassium: 3.7 mmol/L (ref 3.5–5.1)
Sodium: 136 mmol/L (ref 135–145)
Total Bilirubin: 0.7 mg/dL (ref 0.3–1.2)
Total Protein: 6.6 g/dL (ref 6.5–8.1)

## 2021-10-27 LAB — CBC WITH DIFFERENTIAL/PLATELET
Abs Immature Granulocytes: 0.2 10*3/uL — ABNORMAL HIGH (ref 0.00–0.07)
Basophils Absolute: 0 10*3/uL (ref 0.0–0.1)
Basophils Relative: 0 %
Eosinophils Absolute: 0 10*3/uL (ref 0.0–0.5)
Eosinophils Relative: 0 %
HCT: 44.7 % (ref 39.0–52.0)
Hemoglobin: 14.9 g/dL (ref 13.0–17.0)
Immature Granulocytes: 1 %
Lymphocytes Relative: 10 %
Lymphs Abs: 1.4 10*3/uL (ref 0.7–4.0)
MCH: 31 pg (ref 26.0–34.0)
MCHC: 33.3 g/dL (ref 30.0–36.0)
MCV: 93.1 fL (ref 80.0–100.0)
Monocytes Absolute: 1.3 10*3/uL — ABNORMAL HIGH (ref 0.1–1.0)
Monocytes Relative: 9 %
Neutro Abs: 11.6 10*3/uL — ABNORMAL HIGH (ref 1.7–7.7)
Neutrophils Relative %: 80 %
Platelets: 185 10*3/uL (ref 150–400)
RBC: 4.8 MIL/uL (ref 4.22–5.81)
RDW: 13.3 % (ref 11.5–15.5)
WBC: 14.5 10*3/uL — ABNORMAL HIGH (ref 4.0–10.5)
nRBC: 0 % (ref 0.0–0.2)

## 2021-10-27 LAB — GLUCOSE, CAPILLARY
Glucose-Capillary: 187 mg/dL — ABNORMAL HIGH (ref 70–99)
Glucose-Capillary: 362 mg/dL — ABNORMAL HIGH (ref 70–99)
Glucose-Capillary: 207 mg/dL — ABNORMAL HIGH (ref 70–99)
Glucose-Capillary: 314 mg/dL — ABNORMAL HIGH (ref 70–99)

## 2021-10-27 IMAGING — CT CT HEAD W/O CM
4 series · 15 of 47 positions shown, 17 images · non-contrast
Comparison: Postoperative head CT [DATE] and earlier.

CLINICAL DATA: 65-year-old male postoperative day 7 right posterior
temporal lobe craniotomy and tumor resection. Pathology pending.



[Series 3: head wo · axial · 0.41mm/px · z∈[-654,-519]mm · 7 of 37 slices shown, 9 images]
[im 5/37  brain]
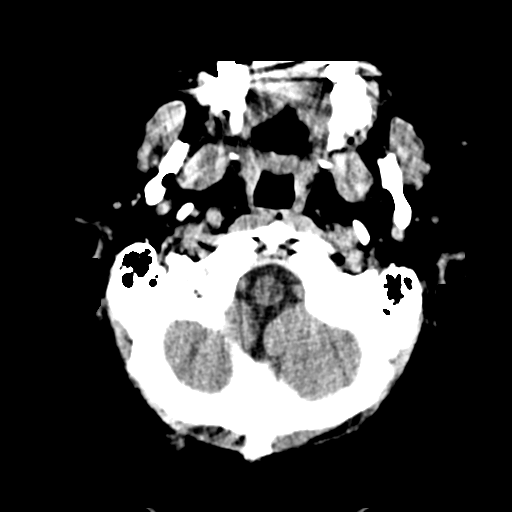
[im 5/37  bone]
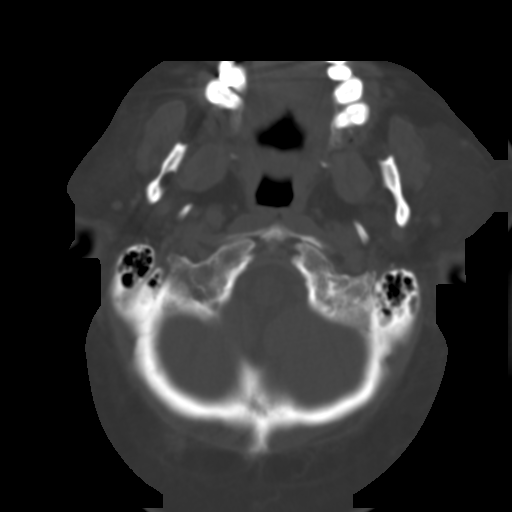
[im 10/37  brain]
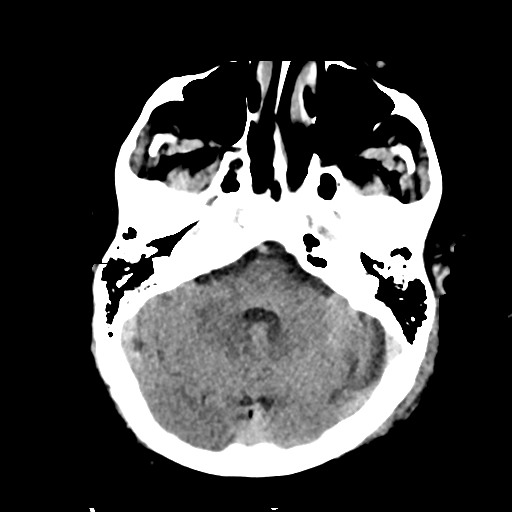
[im 14/37  brain]
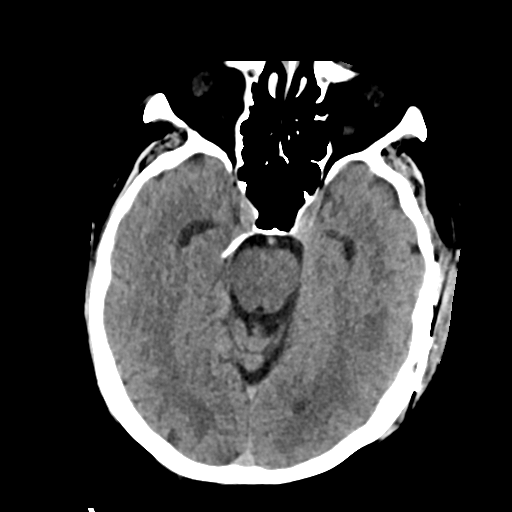
[im 19/37  brain]
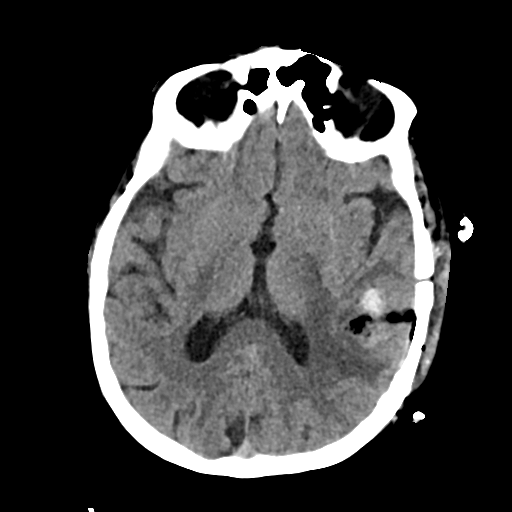
[im 23/37  brain]
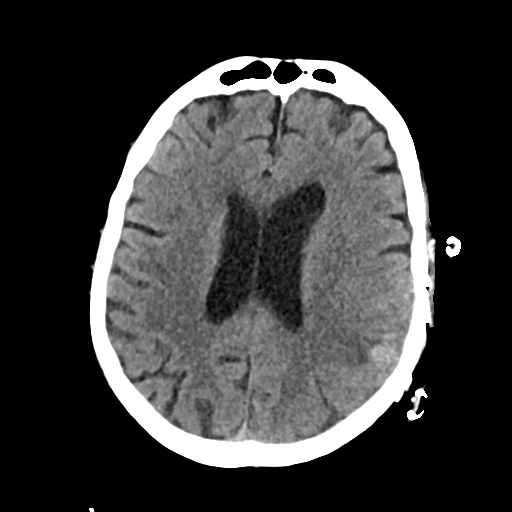
[im 23/37  bone]
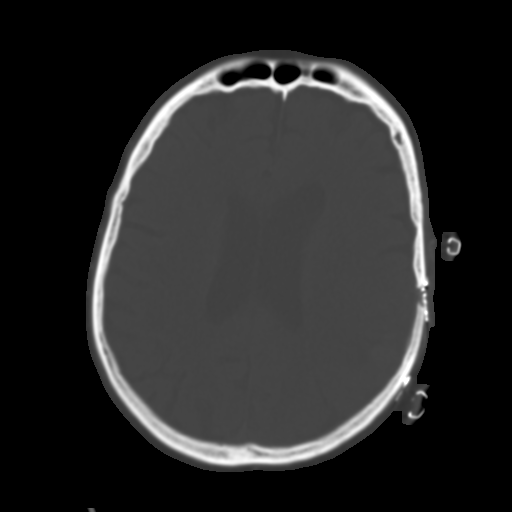
[im 28/37  brain]
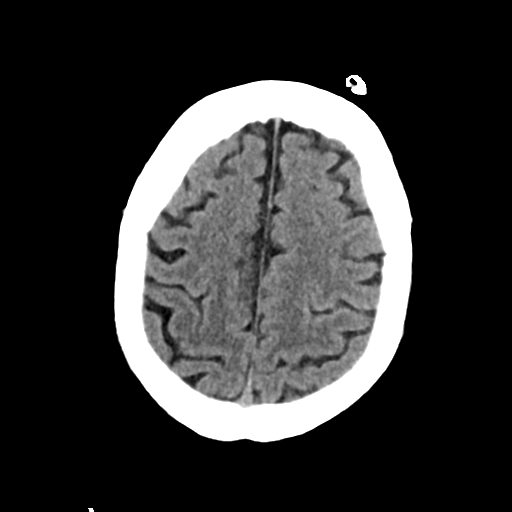
[im 32/37  brain]
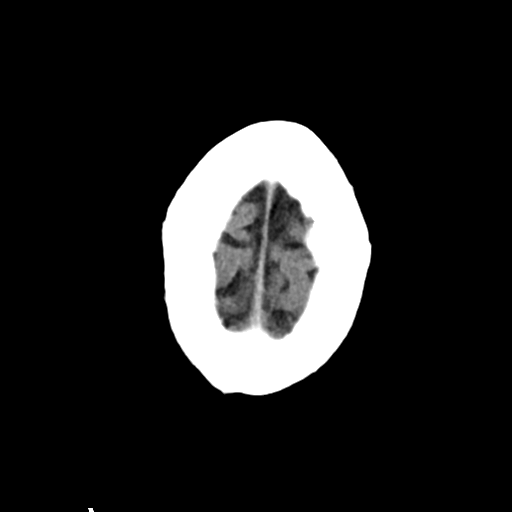

[Series 4: head bone · axial · 0.41mm/px · z∈[-656,-638]mm · 2 of 92 slices shown]
[im 10/92  bone]
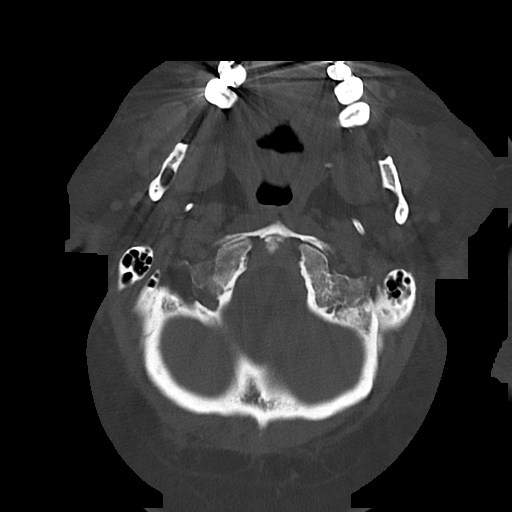
[im 19/92  bone]
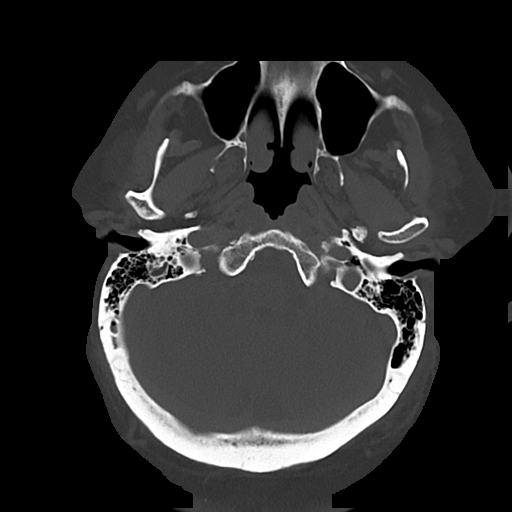

[Series 5: cor soft · coronal · 0.37mm/px · 3 of 87 slices shown]
[im 34/87  brain]
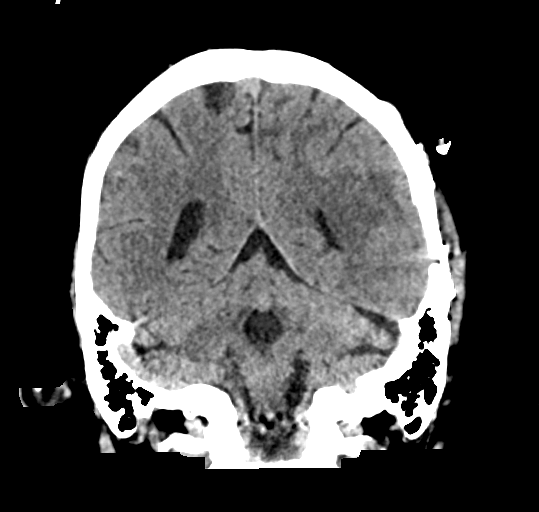
[im 40/87  brain]
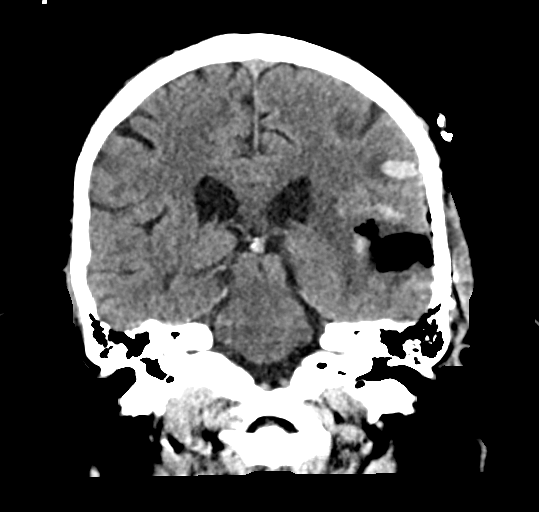
[im 47/87  brain]
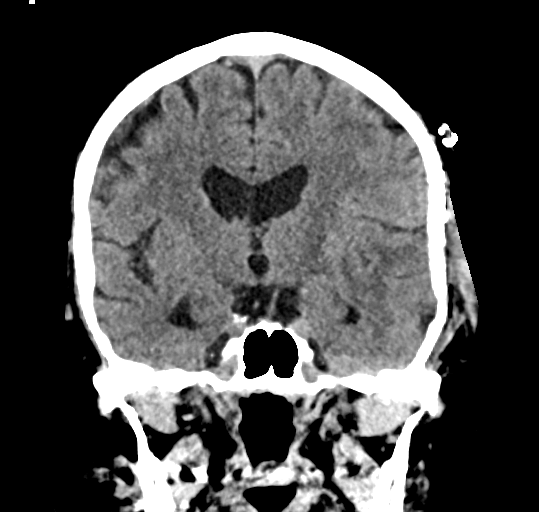

[Series 6: sag soft · sagittal · 0.35mm/px · 3 of 68 slices shown]
[im 23/68  brain]
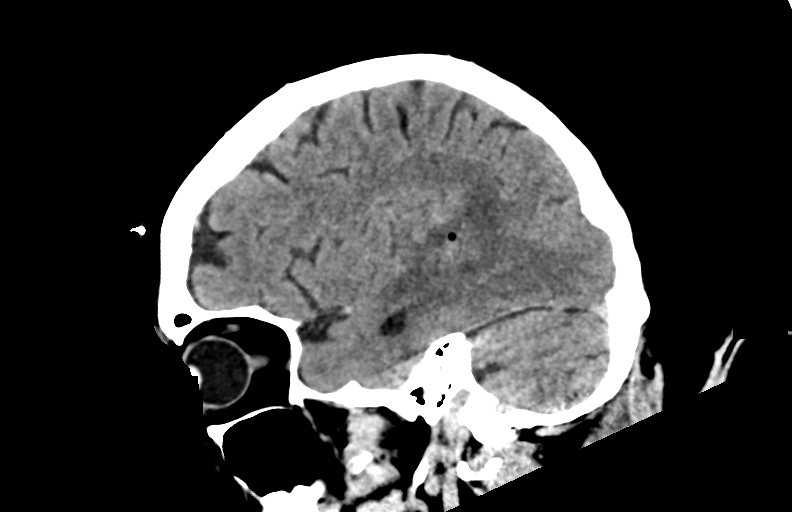
[im 34/68  brain]
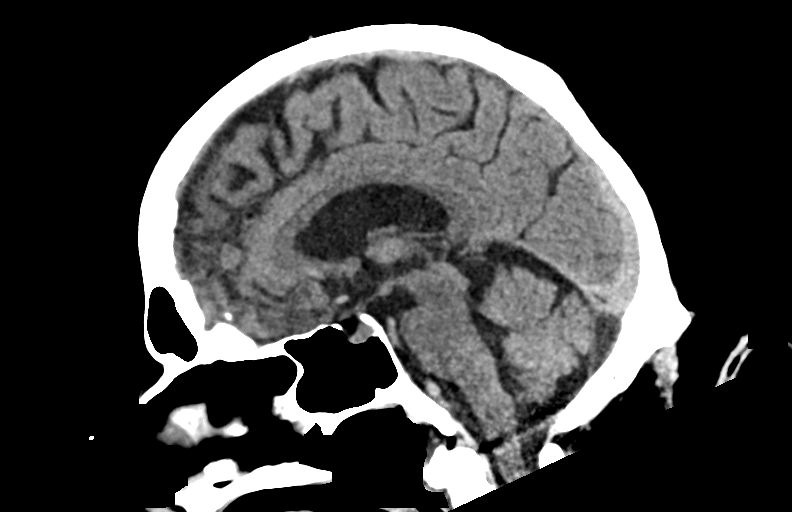
[im 45/68  brain]
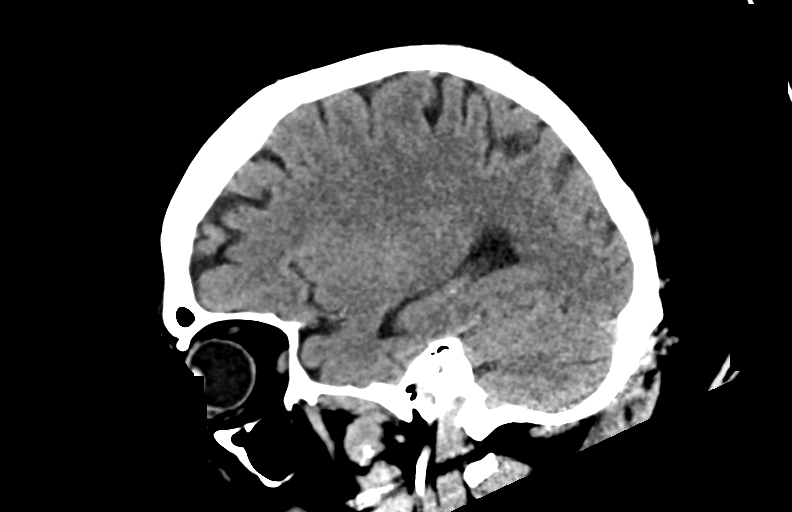

[15 of 47 positions shown; findings below may reference images not displayed]

FINDINGS: Brain: Posterior left temporal lobe resection cavity containing some
gas and fluid has not significantly changed in size or configuration
since [DATE]. Small round foci of regional hemorrhage are also
stable (series 3, images 19 and 22, up to 13 mm individually).
Regional white matter hypodensity appears mildly regressed. Trace
left side subdural pneumocephalus persists.

Stable trace rightward midline shift. Mild mass effect on the atrium
of the left lateral ventricle is stable. No ventriculomegaly.
Basilar cisterns remain patent. No new intracranial hemorrhage or
evidence of cortically based acute infarction.

Vascular: Mild Calcified atherosclerosis at the skull base.

Skull: Stable left lateral craniotomy. No acute osseous abnormality
identified.

Sinuses/Orbits: Visualized paranasal sinuses and mastoids are stable
and well aerated.

Other: Evolving postoperative changes to the left scalp. Skin
staples remain in place. Negative orbits soft tissues.
IMPRESSION: 1. Essentially stable CT appearance of the posterior left temporal
resection. Stable resection cavity with small volume adjacent
parenchymal blood. Unchanged mild regional mass effect.
2. No new intracranial abnormality.

## 2021-10-27 MED ORDER — IPRATROPIUM-ALBUTEROL 0.5-2.5 (3) MG/3ML IN SOLN: 3.0000 mL | Freq: Four times a day (QID) | RESPIRATORY_TRACT | Status: DC | PRN

## 2021-10-27 MED ORDER — INSULIN ASPART 100 UNIT/ML IJ SOLN
0.0000 [IU] | Freq: Every day | INTRAMUSCULAR | 11 refills | Status: DC
Start: 2021-10-27 — End: 2021-11-06

## 2021-10-27 MED ORDER — INSULIN GLARGINE-YFGN 100 UNIT/ML ~~LOC~~ SOLN
28.0000 [IU] | Freq: Two times a day (BID) | SUBCUTANEOUS | Status: DC
  Administered 2021-10-27 – 2021-10-30 (×6): 28 [IU] via SUBCUTANEOUS
  Filled 2021-10-27 (×7): qty 0.28

## 2021-10-27 MED ORDER — METOPROLOL TARTRATE 50 MG PO TABS
100.0000 mg | ORAL_TABLET | Freq: Two times a day (BID) | ORAL | Status: DC
  Administered 2021-10-27 – 2021-11-06 (×20): 100 mg via ORAL
  Filled 2021-10-27 (×20): qty 2

## 2021-10-27 MED ORDER — OXYCODONE-ACETAMINOPHEN 5-325 MG PO TABS
2.0000 | ORAL_TABLET | Freq: Four times a day (QID) | ORAL | Status: DC | PRN
  Administered 2021-10-28 – 2021-11-06 (×25): 2 via ORAL
  Filled 2021-10-27 (×29): qty 2

## 2021-10-27 MED ORDER — ONDANSETRON HCL 4 MG/2ML IJ SOLN: 4.0000 mg | INTRAMUSCULAR | Status: DC | PRN

## 2021-10-27 MED ORDER — INSULIN ASPART 100 UNIT/ML IJ SOLN
8.0000 [IU] | Freq: Three times a day (TID) | INTRAMUSCULAR | Status: DC
  Administered 2021-10-27 – 2021-11-06 (×28): 8 [IU] via SUBCUTANEOUS

## 2021-10-27 MED ORDER — MONTELUKAST SODIUM 10 MG PO TABS
10.0000 mg | ORAL_TABLET | Freq: Every day | ORAL | Status: DC
  Administered 2021-10-27 – 2021-11-05 (×10): 10 mg via ORAL
  Filled 2021-10-27 (×10): qty 1

## 2021-10-27 MED ORDER — LEVETIRACETAM 500 MG PO TABS
1000.0000 mg | ORAL_TABLET | Freq: Two times a day (BID) | ORAL | Status: DC
  Administered 2021-10-27 – 2021-11-03 (×14): 1000 mg via ORAL
  Filled 2021-10-27 (×14): qty 2

## 2021-10-27 MED ORDER — LEVETIRACETAM 1000 MG PO TABS: 1000.0000 mg | ORAL_TABLET | Freq: Two times a day (BID) | ORAL | Status: DC

## 2021-10-27 MED ORDER — DEXAMETHASONE 2 MG PO TABS: 2.0000 mg | ORAL_TABLET | Freq: Four times a day (QID) | ORAL | Status: DC

## 2021-10-27 MED ORDER — INSULIN ASPART 100 UNIT/ML IJ SOLN
0.0000 [IU] | Freq: Three times a day (TID) | INTRAMUSCULAR | Status: DC
  Administered 2021-10-27 – 2021-10-28 (×2): 5 [IU] via SUBCUTANEOUS
  Administered 2021-10-28: 3 [IU] via SUBCUTANEOUS
  Administered 2021-10-28: 2 [IU] via SUBCUTANEOUS
  Administered 2021-10-29 (×2): 5 [IU] via SUBCUTANEOUS
  Administered 2021-10-29: 2 [IU] via SUBCUTANEOUS
  Administered 2021-10-30: 5 [IU] via SUBCUTANEOUS
  Administered 2021-10-30 – 2021-10-31 (×3): 3 [IU] via SUBCUTANEOUS
  Administered 2021-10-31: 8 [IU] via SUBCUTANEOUS
  Administered 2021-11-01 (×3): 2 [IU] via SUBCUTANEOUS
  Administered 2021-11-02: 3 [IU] via SUBCUTANEOUS
  Administered 2021-11-02: 2 [IU] via SUBCUTANEOUS
  Administered 2021-11-03 (×3): 3 [IU] via SUBCUTANEOUS
  Administered 2021-11-04 (×2): 2 [IU] via SUBCUTANEOUS
  Administered 2021-11-05: 18:00:00 3 [IU] via SUBCUTANEOUS
  Administered 2021-11-06: 2 [IU] via SUBCUTANEOUS

## 2021-10-27 MED ORDER — INSULIN GLARGINE-YFGN 100 UNIT/ML ~~LOC~~ SOLN: 28.0000 [IU] | Freq: Two times a day (BID) | SUBCUTANEOUS | 11 refills | Status: DC

## 2021-10-27 MED ORDER — QUETIAPINE FUMARATE 25 MG PO TABS
12.5000 mg | ORAL_TABLET | Freq: Every day | ORAL | Status: DC
  Administered 2021-10-27: 12.5 mg via ORAL
  Filled 2021-10-27: qty 1

## 2021-10-27 MED ORDER — POLYETHYLENE GLYCOL 3350 17 G PO PACK
17.0000 g | PACK | Freq: Two times a day (BID) | ORAL | Status: DC
  Administered 2021-10-28 – 2021-10-30 (×3): 17 g via ORAL
  Filled 2021-10-27 (×12): qty 1

## 2021-10-27 MED ORDER — QUETIAPINE FUMARATE 25 MG PO TABS: 12.5000 mg | ORAL_TABLET | Freq: Every day | ORAL | Status: DC

## 2021-10-27 MED ORDER — ACETAMINOPHEN 650 MG RE SUPP: 650.0000 mg | Freq: Four times a day (QID) | RECTAL | Status: DC | PRN

## 2021-10-27 MED ORDER — ISOSORBIDE MONONITRATE ER 30 MG PO TB24
30.0000 mg | ORAL_TABLET | Freq: Every day | ORAL | Status: DC
  Administered 2021-10-28 – 2021-11-06 (×10): 30 mg via ORAL
  Filled 2021-10-27 (×10): qty 1

## 2021-10-27 MED ORDER — BISACODYL 10 MG RE SUPP: 10.0000 mg | Freq: Every day | RECTAL | Status: DC | PRN

## 2021-10-27 MED ORDER — NITROGLYCERIN 0.4 MG SL SUBL: 0.4000 mg | SUBLINGUAL_TABLET | SUBLINGUAL | Status: DC | PRN

## 2021-10-27 MED ORDER — DEXAMETHASONE 2 MG PO TABS
2.0000 mg | ORAL_TABLET | Freq: Four times a day (QID) | ORAL | Status: DC
  Administered 2021-10-27 – 2021-10-29 (×7): 2 mg via ORAL
  Filled 2021-10-27 (×7): qty 1

## 2021-10-27 MED ORDER — ONDANSETRON HCL 4 MG PO TABS: 4.0000 mg | ORAL_TABLET | ORAL | Status: DC | PRN

## 2021-10-27 MED ORDER — PANTOPRAZOLE SODIUM 40 MG PO TBEC
40.0000 mg | DELAYED_RELEASE_TABLET | Freq: Every day | ORAL | Status: DC
  Administered 2021-10-28 – 2021-11-06 (×10): 40 mg via ORAL
  Filled 2021-10-27 (×10): qty 1

## 2021-10-27 MED ORDER — ACETAMINOPHEN 325 MG PO TABS
650.0000 mg | ORAL_TABLET | Freq: Four times a day (QID) | ORAL | Status: DC | PRN
  Administered 2021-10-27 – 2021-11-05 (×8): 650 mg via ORAL
  Filled 2021-10-27 (×8): qty 2

## 2021-10-27 MED ORDER — INSULIN ASPART 100 UNIT/ML IJ SOLN: 0.0000 [IU] | Freq: Three times a day (TID) | INTRAMUSCULAR | 11 refills | Status: DC

## 2021-10-27 MED ORDER — INSULIN ASPART 100 UNIT/ML IJ SOLN: 8.0000 [IU] | Freq: Three times a day (TID) | INTRAMUSCULAR | 11 refills | Status: DC

## 2021-10-27 MED ORDER — APIXABAN 5 MG PO TABS
5.0000 mg | ORAL_TABLET | Freq: Two times a day (BID) | ORAL | Status: DC
  Administered 2021-10-28 – 2021-11-06 (×19): 5 mg via ORAL
  Filled 2021-10-27 (×19): qty 1

## 2021-10-27 MED ORDER — BUDESONIDE 0.5 MG/2ML IN SUSP
0.2500 mg | Freq: Two times a day (BID) | RESPIRATORY_TRACT | Status: DC
  Administered 2021-10-27 – 2021-11-06 (×16): 0.25 mg via RESPIRATORY_TRACT
  Filled 2021-10-27 (×22): qty 2

## 2021-10-27 NOTE — Progress Notes (Signed)
Pt refusing to go back to bed, sitting on chair. Alarm is on. Pt A/O x 3, disoriented to situation. Pt informed that he is in Rehab and tomorrow early morning he has session with rehab team and he should be resting. Pt continues to refuse to go back to bed.

## 2021-10-27 NOTE — Progress Notes (Signed)
Jeff Staggers, MD  Physician Physical Medicine and Rehabilitation PMR Pre-admission    Signed Date of Service:  10/26/2021  9:23 AM  Related encounter: ED to Hosp-Admission (Current) from 10/15/2021 in [place]      Show:Clear all $RemoveBefore'[x]'hSGprwWPYenYY$ Written$R'[x]'vM$ Templat'[x]'$ Copied  Added by: $RemoveB'[x]'OQBTKzsr$ Cristina Gong, RN$RemoveBeforeDE'[x]'mSkklagOkDZXZer$ Jeff Staggers, MD  $R'[]'rf$ [place] for details                                                                                                                                                                                                                                                                                                                                                                                                                                                                   PMR Admission Coordinator Pre-Admission Assessment   Patient: Jeff Wood. is an 66 y.o., male MRN: [id number] DOB: December 26, 1955 Height: $RemoveBefo'5\' 10"'OqLtFgcVPzV$  (177.8 cm) Weight: 119.7 kg   Insurance Information HMO:     PPO:      PCP:      IPA:      80/20:      OTHER:  PRIMARY: Medicare a and b      Policy#: [id number]      Subscriber: pt Benefits:  Phone #: passport one source     Name: 2/23 Eff. Date: 12/28/2013     Deduct: $1600      Out of Pocket Max: none      Life Max: none CIR: 100%      SNF: 20 full days Outpatient: 80%     Co-Pay: 20% Home Health: 100%      Co-Pay: none DME: 80%     Co-Pay: 20% Providers: pt choice  SECONDARY: [employer]      Policy#: [id number]   Financial Counselor:       Phone#:    The Data Collection Information Summary for patients in Inpatient Rehabilitation Facilities with attached Privacy Act [place] Records was provided and verbally reviewed with: Patient and Family    Emergency Contact Information Contact Information       Name Relation Home Work Mobile    [place] Spouse     [place] Daughter     [phone]         Current Medical History  Patient Admitting Diagnosis: tumor resection   History of Present Illness: Jeff Wood. Jeff Wood is a 66 year old right-handed male history of , hyperlipidemia, COPD with 2 L oxygen, CAD on Plavix, DVT on Eliquis, hypertension, type 2 diabetes mellitus.  Per chart review patient lives with spouse.  1 level home 2 steps to entry.  Modified independent prior to admission.  Wife assist with medication management.  Presented 10/15/2021 with acute onset of aphasia.  Cranial CT scan question 3-4 cm region of subacute infarct versus possibly mass lesion in the left posterior temporal lobe.  CT angiogram head and neck no large vessel occlusion.  Hyperemic mass in the left posterior temporal lobe measuring about 2.5 cm in size.  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  Admission chemistries unremarkable aside glucose 230, urine drug screen negative, hemoglobin A1c 9.5.  Patient underwent left temporal craniotomy for tumor 10/20/2021 per Dr. Annette Stable.  Maintained on Decadron with slow taper.  EEG negative for seizure and maintained on Keppra for seizure prophylaxis..  Follow-up cranial CT scan 10/26/2021 pending.  Tolerating mechanical soft diet.     Complete NIHSS TOTAL: 3   Patient's medical record from [facility] has been reviewed by the rehabilitation admission coordinator and physician.   Past Medical History      Past Medical History:  Diagnosis Date   Morbid obesity ([place]) 10/17/2021    Has the patient had major surgery during 100 days prior to admission? Yes   Family History   family history is not on file.   Current Medications   Current  Facility-Administered Medications:    acetaminophen (TYLENOL) tablet 650 mg, 650 mg, Oral, Q6H PRN, 650 mg at 10/24/21 1924 **OR** acetaminophen (TYLENOL) suppository 650 mg, 650 mg, Rectal, Q6H PRN, Earnie Larsson, MD   bisacodyl (DULCOLAX) suppository 10 mg, 10 mg, Rectal, Daily PRN, Vann, Jessica U, DO   budesonide (PULMICORT) nebulizer solution 0.25 mg, 0.25 mg, Nebulization, BID, Vann, Jessica U, DO, 0.25 mg at 10/27/21 0809   Chlorhexidine Gluconate Cloth 2 % PADS 6 each, 6 each, Topical, Daily, Cyndia Skeeters, Taye T, MD, 6 each at 10/26/21 0829   dexamethasone (DECADRON) tablet 2 mg, 2 mg, Oral, Q6H, Consuella Lose, MD, 2 mg at 10/27/21 0455   hydrALAZINE (APRESOLINE) injection 10 mg, 10 mg, Intravenous, Q6H  PRN, Lang Snow, NP, 10 mg at 10/24/21 2037   HYDROmorphone (DILAUDID) injection 0.5 mg, 0.5 mg, Intravenous, Q2H PRN, Eulogio Bear U, DO, 0.5 mg at 10/26/21 0353   insulin aspart (novoLOG) injection 0-15 Units, 0-15 Units, Subcutaneous, TID WC, Vann, Jessica U, DO, 3 Units at 10/27/21 0917   insulin aspart (novoLOG) injection 0-5 Units, 0-5 Units, Subcutaneous, QHS, Vann, Jessica U, DO, 5 Units at 10/26/21 2207   insulin aspart (novoLOG) injection 8 Units, 8 Units, Subcutaneous, TID WC, Vann, Jessica U, DO, 8 Units at 10/27/21 0920   insulin glargine-yfgn (SEMGLEE) injection 28 Units, 28 Units, Subcutaneous, BID, Vann, Jessica U, DO, 28 Units at 10/27/21 0919   ipratropium-albuterol (DUONEB) 0.5-2.5 (3) MG/3ML nebulizer solution 3 mL, 3 mL, Nebulization, Q6H PRN, Earnie Larsson, MD   isosorbide mononitrate (IMDUR) 24 hr tablet 30 mg, 30 mg, Oral, Daily, Vann, Jessica U, DO, 30 mg at 10/27/21 0919   levETIRAcetam (KEPPRA) tablet 1,000 mg, 1,000 mg, Oral, BID, Consuella Lose, MD, 1,000 mg at 10/27/21 0919   metoprolol tartrate (LOPRESSOR) tablet 100 mg, 100 mg, Oral, BID, Vann, Jessica U, DO, 100 mg at 10/27/21 0919   montelukast (SINGULAIR) tablet 10 mg, 10 mg, Oral, QHS, Vann,  Jessica U, DO, 10 mg at 10/26/21 2207   nitroGLYCERIN (NITROSTAT) SL tablet 0.4 mg, 0.4 mg, Sublingual, Q5 min PRN, Earnie Larsson, MD, 0.4 mg at 10/17/21 1733   ondansetron (ZOFRAN) tablet 4 mg, 4 mg, Oral, Q4H PRN **OR** ondansetron (ZOFRAN) injection 4 mg, 4 mg, Intravenous, Q4H PRN, Pool, Mallie Mussel, MD   oxyCODONE-acetaminophen (PERCOCET/ROXICET) 5-325 MG per tablet 2 tablet, 2 tablet, Oral, Q6H PRN, Earnie Larsson, MD, 2 tablet at 10/27/21 0455   pantoprazole (PROTONIX) EC tablet 40 mg, 40 mg, Oral, Daily, Vann, Jessica U, DO, 40 mg at 10/27/21 0918   polyethylene glycol (MIRALAX / GLYCOLAX) packet 17 g, 17 g, Oral, BID, Vann, Jessica U, DO, 17 g at 10/27/21 2993   QUEtiapine (SEROQUEL) tablet 12.5 mg, 12.5 mg, Oral, QHS, Vann, Jessica U, DO, 12.5 mg at 10/26/21 2207   Patients Current Diet:  Diet Order                  DIET DYS 3 Room service appropriate? No; Fluid consistency: Thin  Diet effective now                       Precautions / Restrictions Precautions Precautions: Fall Precaution Comments: restraints, occasional agitation vs lethargy Restrictions Weight Bearing Restrictions: No    Has the patient had 2 or more falls or a fall with injury in the past year? No   Prior Activity Level Community (5-7x/wk): independent   Prior Functional Level Self Care: Did the patient need help bathing, dressing, using the toilet or eating? Independent   Indoor Mobility: Did the patient need assistance with walking from room to room (with or without device)? Independent   Stairs: Did the patient need assistance with internal or external stairs (with or without device)? Independent   Functional Cognition: Did the patient need help planning regular tasks such as shopping or remembering to take medications? Needed some help   Patient Information Are you of Hispanic, Latino/a,or Spanish origin?: A. No, not of Hispanic, Latino/a, or Spanish origin What is your race?: A. White Do you need or  want an interpreter to communicate with a doctor or health care staff?: 0. No   Patient's Response To:  Health Literacy and Transportation Is  the patient able to respond to health literacy and transportation needs?: No Health Literacy - How often do you need to have someone help you when you read instructions, pamphlets, or other written material from your doctor or pharmacy?: Patient unable to respond In the past 12 months, has lack of transportation kept you from medical appointments or from getting medications?: No In the past 12 months, has lack of transportation kept you from meetings, work, or from getting things needed for daily living?: No   Development worker, international aid / [place] Devices/Equipment: None Home Equipment: Shower seat, Grab bars - toilet, Grab bars - tub/shower, Cane - single point   Prior Device Use: Indicate devices/aids used by the patient prior to current illness, exacerbation or injury?  cane   Current Functional Level Cognition   Arousal/Alertness: Awake/alert Overall Cognitive Status: Impaired/Different from baseline Difficult to assess due to: Impaired communication, Level of arousal Current Attention Level: Selective, Sustained Orientation Level: Oriented to person, Oriented to place, Oriented to time Following Commands: Follows multi-step commands with increased time, Follows multi-step commands inconsistently Safety/Judgement: Decreased awareness of deficits General Comments: Pt with requiring increased time and cues throughout. With direct questions, pt answering ~50% - mutiple answers were not intelligable. Pt requiring Mod cues for ST memory to recall which tasks he was asked to perform. Requiring significant time throughout all ADLs Attention: Focused, Sustained Focused Attention: Impaired Focused Attention Impairment: Verbal complex Sustained Attention: Impaired Sustained Attention Impairment: Verbal basic    Extremity  Assessment (includes Sensation/Coordination)   Upper Extremity Assessment: LUE deficits/detail, RUE deficits/detail RUE Deficits / Details: Limited shoulder ROM at RUE. Difficulty reaching towards sink in standing and using compensatory hiking of the shoulder to raech forward. Decreased grasp strength RUE Coordination: decreased fine motor, decreased gross motor LUE Deficits / Details: decreased grasp strength LUE Coordination: decreased fine motor, decreased gross motor  Lower Extremity Assessment: Defer to PT evaluation     ADLs   Overall ADL's : Needs assistance/impaired Eating/Feeding Details (indicate cue type and reason): hadn over hand at times; eventually able to bring finger food and milk with straw to mouth using L hand, however pt R hand dom (family education on importance of minimizing distractions when attempting self feeding; educated on proper tray set up with minimal items on tray; does best with finger foods; use of hand over hand initially) Grooming: Minimal assistance, Standing Grooming Details (indicate cue type and reason): Min A for standing balance at sink. Pt also quickly fatigues and required seated rest break. Pt requiring Min cues for sequencing. Dropping grooming items as hand strength is poor Upper Body Bathing: Maximal assistance Lower Body Bathing: Total assistance Upper Body Dressing : Maximal assistance Lower Body Dressing: Total assistance Toilet Transfer: Minimal assistance, +2 for physical assistance, +2 for safety/equipment, Ambulation, BSC/3in1, Rolling walker (2 wheels) Toileting- Clothing Manipulation and Hygiene: Total assistance Toileting - Clothing Manipulation Details (indicate cue type and reason): incontinent of BM Functional mobility during ADLs: Minimal assistance, +2 for physical assistance, Rolling walker (2 wheels) General ADL Comments: Pt continues to present with decreased balance, strength, and cognition     Mobility   Overal bed  mobility: Needs Assistance Bed Mobility: Sit to Supine Rolling: Mod assist Sidelying to sit: Mod assist, HOB elevated Supine to sit: Mod assist, +2 for physical assistance, HOB elevated Sit to supine: Mod assist General bed mobility comments: Mod A for elevating BELs over EOB. Requiring assistance to reposition     Transfers   Overall  transfer level: Needs assistance Equipment used: Rolling walker (2 wheels) Transfers: Sit to/from Stand Sit to Stand: Min assist Bed to/from chair/wheelchair/BSC transfer type:: Step pivot Step pivot transfers: Min assist, +2 physical assistance General transfer comment: up to stand from [facility] with min A for balance,     Ambulation / Gait / Stairs / Wheelchair Mobility   Ambulation/Gait Ambulation/Gait assistance: Min assist, Mod assist Gait Distance (Feet): 120 Feet Assistive device: Rolling walker (2 wheels) Gait Pattern/deviations: Step-through pattern, Decreased stride length, Wide base of support General Gait Details: mod cues for direction, assist for balance and walker management at times; able to perform cognitive tasks with ambulation noting seasonal decor and trying to state the holiday as well as finding room number when 3 rooms away     Posture / Balance Dynamic Sitting Balance Sitting balance - Comments: mild posterior bias Balance Overall balance assessment: Needs assistance Sitting-balance support: Feet supported Sitting balance-Leahy Scale: Poor Sitting balance - Comments: mild posterior bias Standing balance support: Bilateral upper extremity supported Standing balance-Leahy Scale: Poor Standing balance comment: UE support for balance     Special needs/care consideration O2 at 2.5 liters when asleep at home Wife has been staying at bedside on acute 24/7                                                              Palliative consulted on acute for goals of care Previous Home Environment  Living Arrangements: Spouse/significant other  (and daughter who moved to [facility] as a Theme park manager)  Lives With: Spouse, Daughter Available Help at Discharge: Family, Available 24 hours/day Type of Home: House Home Layout: One level Home Access: Stairs to enter Entrance Stairs-Rails: None Technical brewer of Steps: 2 Bathroom Shower/Tub: Chiropodist: Handicapped height Bathroom Accessibility: No Home Care Services: No   Moved to [place] 3 weeks pta   Discharge Living Setting Plans for Discharge Living Setting: Lives with (comment) (daughter and wife) Type of Home at Discharge: House Discharge Home Layout: One level Discharge Home Access: Stairs to enter Entrance Stairs-Rails: None Entrance Stairs-Number of Steps: 2 Discharge Bathroom Shower/Tub: Tub/shower unit Discharge Bathroom Toilet: Handicapped height Discharge Bathroom Accessibility: Yes How Accessible: Accessible via walker Does the patient have any problems obtaining your medications?: No   Social/Family/Support Systems Patient Roles: Spouse, Parent Contact Information: wife, Pamala Hurry and daughter, Teacher, music Anticipated Caregiver: wife and daughter Anticipated Ambulance person Information: see contacts Ability/Limitations of Caregiver: wife may have mild dementia; daughter very hands on and supportive Caregiver Availability: 24/7 Discharge Plan Discussed with Primary Caregiver: Yes Is Caregiver In Agreement with Plan?: Yes Does Caregiver/Family have Issues with Lodging/Transportation while Pt is in Rehab?: Yes   Goals Patient/Family Goal for Rehab: supervision to min assist with PT, OT and SLP Expected length of stay: ELOS 10 to 12 days Additional Information Needs: Patient has an emotional support dog at home   Decrease burden of Care through IP rehab admission: n/a   Possible need for SNF placement upon discharge: not anticipated   Patient Condition: I have reviewed medical records from [facility], spoken with CM, and patient,  spouse, and daughter. I met with patient at the bedside for inpatient rehabilitation assessment.  Patient will benefit from ongoing PT, OT, and SLP, can actively participate in 3  hours of therapy a day 5 days of the week, and can make measurable gains during the admission.  Patient will also benefit from the coordinated team approach during an Inpatient Acute Rehabilitation admission.  The patient will receive intensive therapy as well as Rehabilitation physician, nursing, social worker, and care management interventions.  Due to bladder management, bowel management, safety, skin/wound care, disease management, medication administration, pain management, and patient education the patient requires 24 hour a day rehabilitation nursing.  The patient is currently mod assist overall with mobility and basic ADLs.  Discharge setting and therapy post discharge at home with home health is anticipated.  Patient has agreed to participate in the Acute Inpatient Rehabilitation Program and will admit today.   Preadmission Screen Completed By:  Cleatrice Burke, 10/27/2021 10:25 AM ______________________________________________________________________   Discussed status with Dr. Naaman Plummer on 10/27/2021 at 1025 and received approval for admission today.   Admission Coordinator:  Cleatrice Burke, RN, time  7543 Date  10/27/2021    Assessment/Plan: Diagnosis: brain tumor s/p resection 2/21 Does the need for close, 24 hr/day Medical supervision in concert with the patient's rehab needs make it unreasonable for this patient to be served in a less intensive setting? Yes Co-Morbidities requiring supervision/potential complications: copd, cad, htn dm Due to bladder management, bowel management, safety, skin/wound care, disease management, medication administration, pain management, and patient education, does the patient require 24 hr/day rehab nursing? Yes Does the patient require coordinated care of a physician,  rehab nurse, PT, OT, and SLP to address physical and functional deficits in the context of the above medical diagnosis(es)? Yes Addressing deficits in the following areas: balance, endurance, locomotion, strength, transferring, bowel/bladder control, bathing, dressing, feeding, grooming, toileting, cognition, speech, and psychosocial support Can the patient actively participate in an intensive therapy program of at least 3 hrs of therapy 5 days a week? Yes The potential for patient to make measurable gains while on inpatient rehab is excellent Anticipated functional outcomes upon discharge from inpatient rehab: supervision and min assist PT, supervision and min assist OT, supervision and min assist SLP Estimated rehab length of stay to reach the above functional goals is: 10-12 days Anticipated discharge destination: Home 10. Overall Rehab/Functional Prognosis: excellent     MD Signature: Jeff Staggers, MD, [place] Director Rehabilitation Services 10/27/2021          Revision History                          Note Details  Author Jeff Staggers, MD File Time 10/27/2021 10:40 AM  Author Type Physician Status Signed  Last Editor Jeff Staggers, MD Service Physical Medicine and Rehabilitation

## 2021-10-27 NOTE — Progress Notes (Signed)
Dr. Annette Stable in regards to resuming Eliquis that he was on prior to admission and recommends resuming anticoagulation 10/28/2021.

## 2021-10-27 NOTE — H&P (Signed)
Physical Medicine and Rehabilitation Admission H&P        Chief Complaint  Patient presents with   Code Stroke  : HPI: Jeff Wood. Jeff Wood is a 66 year old right-handed male history of , hyperlipidemia, COPD with 2 L oxygen, CAD on Plavix, DVT on Eliquis, hypertension, type 2 diabetes mellitus.  Per chart review patient lives with spouse.  1 level home 2 steps to entry.  Modified independent prior to admission.  Wife assist with medication management.  Presented 10/15/2021 with acute onset of aphasia.  Cranial CT scan question 3-4 cm region of subacute infarct versus possibly mass lesion in the left posterior temporal lobe.  CT angiogram head and neck no large vessel occlusion.  Hyperemic mass in the left posterior temporal lobe measuring about 2.5 cm in size.  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  Admission chemistries unremarkable aside glucose 230, urine drug screen negative, hemoglobin A1c 9.5, lab test positive detection of COVID-19 virus per wife received initial 2 doses of COVID-vaccine and 1 booster shot.  Chest x-ray without acute findings inflammatory markers were normal initially on precautions and discontinued..  Patient underwent left temporal craniotomy for tumor 10/20/2021 per Dr. Annette Stable.  Maintained on Decadron with slow taper.  EEG negative for seizure and maintained on Keppra for seizure prophylaxis..  Follow-up cranial CT scan 10/26/2021 showed stable resection cavity with small volume adjacent parenchymal blood unchanged mild regional mass effect no new intracranial abnormality..  Pathology report pending to be presented to tumor board and Dr. Mickeal Skinner from medical oncology to follow-up.  Awaiting plan from neurosurgery on when to resume chronic Eliquis..  Tolerating mechanical soft diet.  Therapy evaluations completed due to patient decreased  functional mobility cognitive changes was admitted for a comprehensive rehab program.   Review of Systems  Constitutional:  Negative for chills and fever.  HENT:  Negative for hearing loss.   Eyes:  Negative for blurred vision and double vision.  Respiratory:  Positive for shortness of breath.   Cardiovascular:  Positive for leg swelling. Negative for chest pain and palpitations.  Gastrointestinal:  Positive for constipation. Negative for nausea and vomiting.  Genitourinary:  Negative for dysuria, flank pain and hematuria.  Musculoskeletal:  Positive for myalgias.  Skin:  Negative for rash.  Neurological:  Positive for speech change and weakness.  All other systems reviewed and are negative.     Past Medical History:  Diagnosis Date   Morbid obesity ([place]) 10/17/2021         Past Surgical History:  Procedure Laterality Date   APPLICATION OF CRANIAL NAVIGATION Left 10/20/2021    Procedure: APPLICATION OF CRANIAL NAVIGATION;  Surgeon: Earnie Larsson, MD;  Location: [facility];  Service: Neurosurgery;  Laterality: Left;   CRANIOTOMY Left 10/20/2021    Procedure: Left temporal craniotomy for tumor with Brain Lab;  Surgeon: Earnie Larsson, MD;  Location: [facility];  Service: Neurosurgery;  Laterality: Left;   RADIOLOGY WITH ANESTHESIA N/A 10/18/2021    Procedure: MRI WITH ANESTHESIA;  Surgeon: Radiologist, Medication, MD;  Location: [facility];  Service: Radiology;  Laterality: N/A;    History reviewed. No pertinent family history. Social History:  has no history on file for tobacco use, alcohol use, and drug use. Allergies:      Allergies  Allergen Reactions   Iodine Anaphylaxis   Shellfish Allergy Anaphylaxis  Medications Prior to Admission  Medication Sig Dispense Refill   acetaminophen (TYLENOL) 500 MG tablet Take 500 mg by mouth every 6 (six) hours as needed for headache.       albuterol (VENTOLIN HFA) 108 (90 Base) MCG/ACT inhaler Inhale 1-2 puffs into the lungs every 6 (six) hours as  needed for wheezing or shortness of breath.       apixaban (ELIQUIS) 5 MG TABS tablet Take 5 mg by mouth 2 (two) times daily.       atorvastatin (LIPITOR) 40 MG tablet Take 40 mg by mouth daily.       budesonide (PULMICORT) 0.5 MG/2ML nebulizer solution Take 0.5 mg by nebulization 2 (two) times daily as needed (wheezing).       Cholecalciferol (VITAMIN D3 GUMMIES PO) Take 2 tablets by mouth every evening.       clopidogrel (PLAVIX) 75 MG tablet Take 75 mg by mouth daily.       famotidine (PEPCID) 20 MG tablet Take 20 mg by mouth daily.       fluticasone (FLONASE) 50 MCG/ACT nasal spray Place 2 sprays into both nostrils in the morning and at bedtime.       FLUTICASONE FUROATE-VILANTEROL IN Inhale 1 puff into the lungs daily.       Fluticasone-Umeclidin-Vilant (TRELEGY ELLIPTA IN) Inhale 1 puff into the lungs daily.       gabapentin (NEURONTIN) 300 MG capsule Take 300 mg by mouth 3 (three) times daily.       gemfibrozil (LOPID) 600 MG tablet Take 600 mg by mouth 2 (two) times daily before a meal.       glipiZIDE (GLUCOTROL XL) 5 MG 24 hr tablet Take 5 mg by mouth in the morning and at bedtime.       ipratropium-albuterol (DUONEB) 0.5-2.5 (3) MG/3ML SOLN Take 3 mLs by nebulization every 6 (six) hours as needed (shortness of breath).       isosorbide mononitrate (IMDUR) 30 MG 24 hr tablet Take 30 mg by mouth daily.       losartan-hydrochlorothiazide (HYZAAR) 100-12.5 MG tablet Take 1 tablet by mouth daily.       metoprolol tartrate (LOPRESSOR) 100 MG tablet Take 100 mg by mouth 2 (two) times daily.       montelukast (SINGULAIR) 10 MG tablet Take 10 mg by mouth every evening.       Multiple Vitamins-Minerals (MENS MULTIVITAMIN PO) Take 1 tablet by mouth daily.       nitroGLYCERIN (NITROSTAT) 0.4 MG SL tablet Place 0.4 mg under the tongue every 5 (five) minutes as needed for chest pain.       oxyCODONE-acetaminophen (PERCOCET) 10-325 MG tablet Take 1 tablet by mouth 4 (four) times daily as needed for  pain.       pantoprazole (PROTONIX) 40 MG tablet Take 40 mg by mouth daily.       tiZANidine (ZANAFLEX) 4 MG tablet Take 4 mg by mouth every 8 (eight) hours as needed for muscle spasms.       torsemide (DEMADEX) 20 MG tablet Take 20 mg by mouth daily.       traZODone (DESYREL) 100 MG tablet Take 200 mg by mouth at bedtime as needed for sleep.              Home: Home Living Family/patient expects to be discharged to:: Inpatient rehab Living Arrangements: Spouse/significant other (and daughter who moved to [facility] as a Theme park manager) Available Help at Discharge: Family, Available 24 hours/day Type of  Home: House Home Access: Stairs to enter [employer] of Steps: 2 Entrance Stairs-Rails: None Home Layout: One level Bathroom Shower/Tub: Chiropodist: Handicapped height Bathroom Accessibility: No Home Equipment: Civil engineer, contracting, Grab bars - toilet, Grab bars - tub/shower, Radio producer - single point  Lives With: Spouse, Daughter   Functional History: Prior Function Prior Level of Function : Independent/Modified Independent, Needs assist  Cognitive Assist : ADLs (cognitive) ADLs (Cognitive): Intermittent cues ADLs Comments: wife assists wtih medicaiton management; wears 2.5 L at home when sleeping/napping; has support dog down stairs; enjoys doing christain word search   Functional Status:  Mobility: Bed Mobility Overal bed mobility: Needs Assistance Bed Mobility: Sit to Supine Rolling: Mod assist Sidelying to sit: Mod assist, HOB elevated Supine to sit: Mod assist, +2 for physical assistance, HOB elevated Sit to supine: Mod assist General bed mobility comments: Mod A for elevating BELs over EOB. Requiring assistance to reposition Transfers Overall transfer level: Needs assistance Equipment used: Rolling walker (2 wheels) Transfers: Sit to/from Stand Sit to Stand: Min assist Bed to/from chair/wheelchair/BSC transfer type:: Step pivot Step pivot transfers: Min assist,  +2 physical assistance General transfer comment: up to stand from [facility] with min A for balance, Ambulation/Gait Ambulation/Gait assistance: Min assist, Mod assist Gait Distance (Feet): 120 Feet Assistive device: Rolling walker (2 wheels) Gait Pattern/deviations: Step-through pattern, Decreased stride length, Wide base of support General Gait Details: mod cues for direction, assist for balance and walker management at times; able to perform cognitive tasks with ambulation noting seasonal decor and trying to state the holiday as well as finding room number when 3 rooms away   ADL: ADL Overall ADL's : Needs assistance/impaired Eating/Feeding Details (indicate cue type and reason): hadn over hand at times; eventually able to bring finger food and milk with straw to mouth using L hand, however pt R hand dom (family education on importance of minimizing distractions when attempting self feeding; educated on proper tray set up with minimal items on tray; does best with finger foods; use of hand over hand initially) Grooming: Minimal assistance, Standing Grooming Details (indicate cue type and reason): Min A for standing balance at sink. Pt also quickly fatigues and required seated rest break. Pt requiring Min cues for sequencing. Dropping grooming items as hand strength is poor Upper Body Bathing: Maximal assistance Lower Body Bathing: Total assistance Upper Body Dressing : Maximal assistance Lower Body Dressing: Total assistance Toilet Transfer: Minimal assistance, +2 for physical assistance, +2 for safety/equipment, Ambulation, BSC/3in1, Rolling walker (2 wheels) Toileting- Clothing Manipulation and Hygiene: Total assistance Toileting - Clothing Manipulation Details (indicate cue type and reason): incontinent of BM Functional mobility during ADLs: Minimal assistance, +2 for physical assistance, Rolling walker (2 wheels) General ADL Comments: Pt continues to present with decreased balance, strength,  and cognition   Cognition: Cognition Overall Cognitive Status: Impaired/Different from baseline Arousal/Alertness: Awake/alert Orientation Level: Oriented to person, Oriented to place, Oriented to time Attention: Focused, Sustained Focused Attention: Impaired Focused Attention Impairment: Verbal complex Sustained Attention: Impaired Sustained Attention Impairment: Verbal basic Cognition Arousal/Alertness: Awake/alert Behavior During Therapy: WFL for tasks assessed/performed Overall Cognitive Status: Impaired/Different from baseline Area of Impairment: Attention, Following commands, Problem solving, Memory Orientation Level: Disoriented to, Place, Time, Situation Current Attention Level: Selective, Sustained Memory: Decreased short-term memory Following Commands: Follows multi-step commands with increased time, Follows multi-step commands inconsistently Safety/Judgement: Decreased awareness of deficits Problem Solving: Slow processing, Difficulty sequencing, Requires verbal cues General Comments: Pt with requiring increased time and cues throughout. With  direct questions, pt answering ~50% - mutiple answers were not intelligable. Pt requiring Mod cues for ST memory to recall which tasks he was asked to perform. Requiring significant time throughout all ADLs Difficult to assess due to: Impaired communication, Level of arousal   Physical Exam: Blood pressure 138/82, pulse 73, temperature 98.9 F (37.2 C), temperature source Oral, resp. rate 14, height 5\' 10"  (1.778 m), weight 119.7 kg, SpO2 95 %. Physical Exam Constitutional:      Appearance: He is obese.  HENT:     Head:     Comments: Craniotomy site clean and dry with staples. Staples over each eyebrow.     Nose: Nose normal.  Eyes:     Conjunctiva/sclera: Conjunctivae normal.     Pupils: Pupils are equal, round, and reactive to light.  Cardiovascular:     Rate and Rhythm: Normal rate and regular rhythm.     Heart sounds: No  murmur heard.   No gallop.  Pulmonary:     Effort: Pulmonary effort is normal. No respiratory distress.     Breath sounds: No wheezing.  Abdominal:     General: Bowel sounds are normal. There is no distension.     Palpations: Abdomen is soft.     Tenderness: There is no abdominal tenderness.  Musculoskeletal:        General: No tenderness.     Cervical back: No rigidity or tenderness.  Skin:    General: Skin is warm.     Findings: No lesion.  Neurological:     Mental Status: He is alert.     Comments: Patient is alert.  No acute distress.  Makes eye contact with examiner. Does track my finger bilaterally to peripheries. Speech and processing are delayed. Oriented to person, [place], day of month (year with cueing). Moves all 4 limbs, right seems to be more delayed than left. 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 Decreased LT/PP in stocking glove distribution below the knees and in the palm of each hand. No abnormal resting tone.   Psychiatric:     Comments: Flat but cooperative.       Lab Results Last 48 Hours        Results for orders placed or performed during the hospital encounter of 10/15/21 (from the past 48 hour(s))  Glucose, capillary     Status: Abnormal    Collection Time: 10/25/21  7:52 AM  Result Value Ref Range    Glucose-Capillary 224 (H) 70 - 99 mg/dL      Comment: Glucose reference range applies only to samples taken after fasting for at least 8 hours.  Glucose, capillary     Status: Abnormal    Collection Time: 10/25/21 12:20 PM  Result Value Ref Range    Glucose-Capillary 360 (H) 70 - 99 mg/dL      Comment: Glucose reference range applies only to samples taken after fasting for at least 8 hours.  Glucose, capillary     Status: Abnormal    Collection Time: 10/25/21  3:30 PM  Result Value Ref Range    Glucose-Capillary 227 (H) 70 - 99 mg/dL      Comment: Glucose reference range applies only to samples taken after fasting for at least 8 hours.  Glucose, capillary      Status: Abnormal    Collection Time: 10/25/21  8:11 PM  Result Value Ref Range    Glucose-Capillary 353 (H) 70 - 99 mg/dL      Comment: Glucose reference range applies only to samples  taken after fasting for at least 8 hours.    Comment 1 Notify RN    Glucose, capillary     Status: Abnormal    Collection Time: 10/25/21 11:19 PM  Result Value Ref Range    Glucose-Capillary 293 (H) 70 - 99 mg/dL      Comment: Glucose reference range applies only to samples taken after fasting for at least 8 hours.  Glucose, capillary     Status: Abnormal    Collection Time: 10/26/21  3:29 AM  Result Value Ref Range    Glucose-Capillary 260 (H) 70 - 99 mg/dL      Comment: Glucose reference range applies only to samples taken after fasting for at least 8 hours.  Glucose, capillary     Status: Abnormal    Collection Time: 10/26/21  7:24 AM  Result Value Ref Range    Glucose-Capillary 240 (H) 70 - 99 mg/dL      Comment: Glucose reference range applies only to samples taken after fasting for at least 8 hours.  Basic metabolic panel     Status: Abnormal    Collection Time: 10/26/21 10:00 AM  Result Value Ref Range    Sodium 136 135 - 145 mmol/L    Potassium 3.7 3.5 - 5.1 mmol/L    Chloride 99 98 - 111 mmol/L    CO2 22 22 - 32 mmol/L    Glucose, Bld 398 (H) 70 - 99 mg/dL      Comment: Glucose reference range applies only to samples taken after fasting for at least 8 hours.    BUN 33 (H) 8 - 23 mg/dL    Creatinine, Ser 1.37 (H) 0.61 - 1.24 mg/dL    Calcium 9.4 8.9 - 10.3 mg/dL    GFR, Estimated 57 (L) >60 mL/min      Comment: (NOTE) Calculated using the CKD-EPI Creatinine Equation (2021)      Anion gap 15 5 - 15      Comment: Performed at [facility] [address], [place], [place] [address]  CBC     Status: Abnormal    Collection Time: 10/26/21 10:00 AM  Result Value Ref Range    WBC 13.8 (H) 4.0 - 10.5 K/uL    RBC 4.88 4.22 - 5.81 MIL/uL    Hemoglobin 15.5 13.0 - 17.0 g/dL    HCT 45.8  39.0 - 52.0 %    MCV 93.9 80.0 - 100.0 fL    MCH 31.8 26.0 - 34.0 pg    MCHC 33.8 30.0 - 36.0 g/dL    RDW 13.5 11.5 - 15.5 %    Platelets 246 150 - 400 K/uL    nRBC 0.0 0.0 - 0.2 %      Comment: Performed at [facility], [place] [address], [place], [place] [address]  Glucose, capillary     Status: Abnormal    Collection Time: 10/26/21 11:43 AM  Result Value Ref Range    Glucose-Capillary 338 (H) 70 - 99 mg/dL      Comment: Glucose reference range applies only to samples taken after fasting for at least 8 hours.  Glucose, capillary     Status: Abnormal    Collection Time: 10/26/21  4:55 PM  Result Value Ref Range    Glucose-Capillary 331 (H) 70 - 99 mg/dL      Comment: Glucose reference range applies only to samples taken after fasting for at least 8 hours.  Glucose, capillary     Status: Abnormal  Collection Time: 10/26/21 10:02 PM  Result Value Ref Range    Glucose-Capillary 361 (H) 70 - 99 mg/dL      Comment: Glucose reference range applies only to samples taken after fasting for at least 8 hours.       Imaging Results (Last 48 hours)  CT HEAD WO CONTRAST (5MM)   Result Date: 10/27/2021 CLINICAL DATA:  66 year old male postoperative day 7 right posterior temporal lobe craniotomy and tumor resection. Pathology pending. EXAM: CT HEAD WITHOUT CONTRAST TECHNIQUE: Contiguous axial images were obtained from the base of the skull through the vertex without intravenous contrast. RADIATION DOSE REDUCTION: This exam was performed according to the departmental dose-optimization program which includes automated exposure control, adjustment of the mA and/or kV according to patient size and/or use of iterative reconstruction technique. COMPARISON:  Postoperative head CT 10/21/2021 and earlier. FINDINGS: Brain: Posterior left temporal lobe resection cavity containing some gas and fluid has not significantly changed in size or configuration since 10/21/2021. Small round foci of regional  hemorrhage are also stable (series 3, images 19 and 22, up to 13 mm individually). Regional white matter hypodensity appears mildly regressed. Trace left side subdural pneumocephalus persists. Stable trace rightward midline shift. Mild mass effect on the atrium of the left lateral ventricle is stable. No ventriculomegaly. Basilar cisterns remain patent. No new intracranial hemorrhage or evidence of cortically based acute infarction. Vascular: Mild Calcified atherosclerosis at the skull base. Skull: Stable left lateral craniotomy. No acute osseous abnormality identified. Sinuses/Orbits: Visualized paranasal sinuses and mastoids are stable and well aerated. Other: Evolving postoperative changes to the left scalp. Skin staples remain in place. Negative orbits soft tissues. IMPRESSION: 1. Essentially stable CT appearance of the posterior left temporal resection. Stable resection cavity with small volume adjacent parenchymal blood. Unchanged mild regional mass effect. 2. No new intracranial abnormality. Electronically Signed   By: Genevie Ann M.D.   On: 10/27/2021 04:50           Blood pressure 138/82, pulse 73, temperature 98.9 F (37.2 C), temperature source Oral, resp. rate 14, height 5\' 10"  (1.778 m), weight 119.7 kg, SpO2 95 %.   Medical Problem List and Plan: 1. 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  Status post right craniotomy resection of tumor worrisome for glioblastoma 10/20/2021.  -DECADRON TAPER.  Medical oncology Dr. Mickeal Skinner to follow-up on plan of care             -patient may shower             -ELOS/Goals: 10-12 days, supervision to min assist goals 2.  Antithrombotics: -DVT/anticoagulation: Awaiting plan from neurosurgery when chronic Eliquis can be resumed.             -antiplatelet therapy: N/A 3. Pain Management: Oxycodone as needed 4. Mood: Provide emotional support             -antipsychotic agents: Seroquel 12.5 mg nightly 5. Neuropsych: This patient is capable of  making decisions on his own behalf. 6. Skin/Wound Care: Routine skin checks             -continue staples for now 7. Fluids/Electrolytes/Nutrition: Routine in and outs with follow-up chemistries 8.  Seizure prophylaxis.  Keppra 1000 mg twice daily 9.  History of CAD.  Plavix remains on hold.  No chest pain or shortness of breath.  Continue Imdur 30 mg daily and nitroglycerin as needed 10.  Diabetes mellitus.  Hemoglobin A1c 9.6.  NovoLog 8 units 3 times daily, Semglee 28  units twice daily. 11.  COPD.  Oxygen dependent 2 L prior to admission.  Continue nebulizers as directed 12.  Hypertension.  Lopressor 100 mg twice daily       Cathlyn Parsons, PA-C 10/27/2021  I have personally performed a face to face diagnostic evaluation of this patient and formulated the key components of the plan.  Additionally, I have personally reviewed laboratory data, imaging studies, as well as relevant notes and concur with the physician assistant's documentation above.  The patient's status has not changed from the original H&P.  Any changes in documentation from the acute care chart have been noted above.  Meredith Staggers, MD, [facility]

## 2021-10-27 NOTE — Progress Notes (Signed)
° °  Providing Compassionate, Quality Care - Together   Subjective: Nurse reports no issues overnight. Patient is up to chair.  Objective: Vital signs in last 24 hours: Temp:  [98.1 F (36.7 C)-98.9 F (37.2 C)] 98.1 F (36.7 C) (02/28 0810) Pulse Rate:  [59-79] 66 (02/28 0919) Resp:  [14-20] 18 (02/28 0810) BP: (110-145)/(75-90) 128/86 (02/28 0810) SpO2:  [94 %-100 %] 94 % (02/28 0810)  Intake/Output from previous day: No intake/output data recorded. Intake/Output this shift: No intake/output data recorded.  Alert, oriented to person, place, and time PERRLA Speech clear MAE, generalized weakness Incision closed with staples; area is clean, dry, and intact  Lab Results: Recent Labs    10/25/21 0145 10/26/21 1000  WBC 9.7 13.8*  HGB 14.8 15.5  HCT 43.9 45.8  PLT 279 246   BMET Recent Labs    10/25/21 0145 10/26/21 1000  NA 140 136  K 3.7 3.7  CL 105 99  CO2 21* 22  GLUCOSE 235* 398*  BUN 25* 33*  CREATININE 1.06 1.37*  CALCIUM 9.3 9.4    Studies/Results: CT HEAD WO CONTRAST (5MM)  Result Date: 10/27/2021 CLINICAL DATA:  66 year old male postoperative day 7 right posterior temporal lobe craniotomy and tumor resection. Pathology pending. EXAM: CT HEAD WITHOUT CONTRAST TECHNIQUE: Contiguous axial images were obtained from the base of the skull through the vertex without intravenous contrast. RADIATION DOSE REDUCTION: This exam was performed according to the departmental dose-optimization program which includes automated exposure control, adjustment of the mA and/or kV according to patient size and/or use of iterative reconstruction technique. COMPARISON:  Postoperative head CT 10/21/2021 and earlier. FINDINGS: Brain: Posterior left temporal lobe resection cavity containing some gas and fluid has not significantly changed in size or configuration since 10/21/2021. Small round foci of regional hemorrhage are also stable (series 3, images 19 and 22, up to 13 mm  individually). Regional white matter hypodensity appears mildly regressed. Trace left side subdural pneumocephalus persists. Stable trace rightward midline shift. Mild mass effect on the atrium of the left lateral ventricle is stable. No ventriculomegaly. Basilar cisterns remain patent. No new intracranial hemorrhage or evidence of cortically based acute infarction. Vascular: Mild Calcified atherosclerosis at the skull base. Skull: Stable left lateral craniotomy. No acute osseous abnormality identified. Sinuses/Orbits: Visualized paranasal sinuses and mastoids are stable and well aerated. Other: Evolving postoperative changes to the left scalp. Skin staples remain in place. Negative orbits soft tissues. IMPRESSION: 1. Essentially stable CT appearance of the posterior left temporal resection. Stable resection cavity with small volume adjacent parenchymal blood. Unchanged mild regional mass effect. 2. No new intracranial abnormality. Electronically Signed   By: Genevie Ann M.D.   On: 10/27/2021 04:50    Assessment/Plan: Patient underwent right craniotomy for resection of tumor by Dr. Annette Stable on 10/20/2021. His neuro exam continues to improve. Therapies have recommended CIR at discharge and patient has been approved.   LOS: 11 days   -Staples can be removed on 10/30/2021 -Patient will discharge to CIR today.   Viona Gilmore, DNP, AGNP-C Nurse Practitioner  [facility] [place] [address], [place], [place], [place] [address] P: [phone]     F: [phone]  10/27/2021, 11:56 AM

## 2021-10-27 NOTE — Progress Notes (Signed)
Inpatient Rehabilitation Admission Medication Review by a Pharmacist  A complete drug regimen review was completed for this patient to identify any potential clinically significant medication issues.  High Risk Drug Classes Is patient taking? Indication by Medication  Antipsychotic Yes Seroquel- sleep  Anticoagulant Yes Apixaban- extensive DVT  Antibiotic No   Opioid Yes Percocet- acute pain  Antiplatelet No   Hypoglycemics/insulin Yes Semglee, iSS- T2DM  Vasoactive Medication Yes Imdur, NitroSL, lopressor- angina, hypertension  Chemotherapy No   Other Yes Keppra- seizure prevention Singulair- asthma Protonix- GERD     Type of Medication Issue Identified Description of Issue Recommendation(s)  Drug Interaction(s) (clinically significant)     Duplicate Therapy     Allergy     No Medication Administration End Date     Incorrect Dose     Additional Drug Therapy Needed     Significant med changes from prior encounter (inform family/care partners about these prior to discharge).    Other  PTA meds Vitamin D-3 Plavix Pepcid Flonase Trelegy Gabapentin Lopid Glucotrol Hyzaar zanaflex demadex trazodone Restart PTA meds when and if clinically necessary during CIR admission or at time of discharge if warranted    Clinically significant medication issues were identified that warrant physician communication and completion of prescribed/recommended actions by midnight of the next day:  No  Name of provider notified for urgent issues identified:   Provider Method of Notification:     Pharmacist comments:   Time spent performing this drug regimen review (minutes):  30   Verla Bryngelson BS, PharmD, BCPS Clinical Pharmacist 10/28/2021 7:50 AM

## 2021-10-27 NOTE — Progress Notes (Signed)
Inpatient Rehabilitation Admissions Coordinator   Cir bed is available to admit him today. Acute team and TOC made aware. I will make the arrangments to admit today.  Danne Baxter, RN, MSN Rehab Admissions Coordinator [phone] 10/27/2021 10:22 AM

## 2021-10-27 NOTE — Discharge Summary (Signed)
Physician Discharge Summary   Patient: Jeff Helming Sr. MRN: [id number] DOB: 1956/04/27  Admit date:     10/15/2021  Discharge date: 10/27/21  Discharge Physician: Geradine Girt   PCP: Pcp, No   Recommendations at discharge:    To CIR Restart eliquis 3. Wean off seroquel as able Adjust insulin   Discharge Diagnoses: Principal Problem:   Acute toxic metabolic encephalopathy with aphasia with new left temporal mass Active Problems:   CAD S/P percutaneous coronary angioplasty   History of DVT (deep vein thrombosis)   Essential hypertension   Diabetes mellitus type 2, noninsulin dependent (HCC)   Lab test positive for detection of COVID-19 virus   Sinus bradycardia   Morbid obesity (HCC)   Hyperlipidemia   COPD with chronic hypoxic respiratory failure   Hypokalemia   Mass of left temporal lobe   Agitation   Paroxysmal atrial fibrillation with RVR ([place])  Resolved Problems:   * No resolved hospital problems. *   Hospital Course: 66 year old M with PMH of CAD/remote stent on Plavix, chronic RF on 2 L, COPD, chronic pain on opiate, extensive DVT on Eliquis, DM-2, HTN, HLD and morbid obesity presenting with acute onset confusion and aphasia with concern for CVA.  Reportedly took 2 of the 200 mg trazodone and he is the pain medication prior to arrival.  UDS negative.  He tested positive for COVID-19 but symptomatic for over 2 weeks.   CT head concerning for 3 to 4 cm region of subacute infarction versus mass in left posterior temporal lobe.  Neurology consulted.  He was started on Keppra 500 mg twice daily. Spot and LTM EEG without seizure or epileptiform discharge.    CT angio head and neck and CT cerebral perfusion with contrast without LVO but showed 2.5 cm hyperemic mass in left posterior temporal lobe.   MRI brain without contrast also showed mass lesion of the left temporal lobe with associated edema but truncated exam due to patient's inability to tolerate setting of  mental status change.  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    Neurosurgery consulted and started Decadron.  Plan for left temporal craniotomy and resection of tumor on 2/21.  Neurology, neurosurgery neurology and neurosurgery following.  Neurology and neurooncology, Dr. Mickeal Skinner recommended CT chest/abdomen/pelvis to exclude for possible primary elsewhere - appears cancelled due to allergy...will defer to Dr. Mickeal Skinner to order if needed  Mental status improved Plan for CIR when bed   Assessment and Plan: * Acute toxic metabolic encephalopathy with aphasia with new left temporal mass- (present on admission) Acute onset.  Does not seem to have other focal neuro deficit but limited exam due to confusion.  -No seizure activity since admission.   -Spot and LTM EEG negative for seizure.  -UDS, EtOH and TSH negative.  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 -Continue Keppra 500 mg twice daily- will discuss with neurology in case keppra is worsening delirium/behavior -has not been getting seroquel at night so will d/c -IV Haldol schedule-- will d/c and try nightly seroquel- wean to off as recovers -Seizure, fall, delirium and aspiration precaution  -Minimize sedating medications -SLP/PT/OT eval- CIR consulted  Paroxysmal atrial fibrillation with RVR ([place]) -on BB -responded to cardizem bolus -elquis resumed  Agitation Seen encephalopathy above -improved with IV haldol-- will  d/c and monitor-- seroquel QHS for now  Mass of left temporal lobe Multiple imaging showed 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 -CT  chest/abdomen/pelvis not done---Neuro oncology, Dr. Mickeal Skinner consulted as well -path pending   Hypokalemia Resolved.  COPD with chronic hypoxic respiratory failure On 2.5 L by [place] at baseline at night--  -steroid neb -DuoNeb as needed  Hyperlipidemia - Atorvastatin as above.  Morbid obesity ([place]) Estimated body mass index is 37.86 kg/m as calculated from the following:   Height as of this encounter: 5\' 10"  (1.778 m).   Weight as of this encounter: 119.7 kg.  Sinus bradycardia- (present on admission) Likely due to beta-blocker.  He was on metoprolol 100 mg twice daily at home PO BB -Continue telemetry monitoring  Lab test positive for detection of COVID-19 virus- (present on admission) Previous attending discussed with lab (CT value 38.1).  Per wife, patient has received initial 2 doses of COVID-vaccine and 1 booster shot.  He started having cough and congestion 2 weeks ago.  Chronically uses 2.5 L supplemental oxygen and no change in oxygen requirement from baseline.  CXR without acute finding.  Inflammatory markers within normal. -d/c precautions    Diabetes mellitus type 2, noninsulin dependent (HCC) Uncontrolled NIDDM-2 with hyperglycemia and other complications: A3F 5.7%.  Seems he is only on glipizide at home.  Hyperglycemia likely due to steroid. -adjust medications as able    Essential hypertension Stable. -Cardiac meds as above.  History of DVT (deep vein thrombosis) -resume elquis  CAD S/P percutaneous coronary angioplasty No anginal symptoms. -on BB -Continue holding Hyzaar. -PRN demadex       DISCHARGE MEDICATION: Allergies as of 10/27/2021       Reactions   Iodine Anaphylaxis   Shellfish Allergy Anaphylaxis        Medication List     STOP taking these medications    clopidogrel 75 MG tablet Commonly known as: PLAVIX   FLUTICASONE FUROATE-VILANTEROL IN   gabapentin 300 MG capsule Commonly known as: NEURONTIN   glipiZIDE 5 MG 24 hr  tablet Commonly known as: GLUCOTROL XL   losartan-hydrochlorothiazide 100-12.5 MG tablet Commonly known as: HYZAAR   tiZANidine 4 MG tablet Commonly known as: ZANAFLEX   torsemide 20 MG tablet Commonly known as: DEMADEX   traZODone 100 MG tablet Commonly known as: DESYREL       TAKE these medications    acetaminophen 500 MG tablet Commonly known as: TYLENOL Take 500 mg by mouth every 6 (six) hours as needed for headache.   albuterol 108 (90 Base) MCG/ACT inhaler Commonly known as: VENTOLIN HFA Inhale 1-2 puffs into the lungs every 6 (six) hours as needed for wheezing or shortness of breath.   apixaban 5 MG Tabs tablet Commonly known as: ELIQUIS Take 5 mg by mouth 2 (two) times daily.   atorvastatin 40 MG tablet Commonly known as: LIPITOR Take 40 mg by mouth daily.   budesonide 0.5 MG/2ML nebulizer solution Commonly known as: PULMICORT Take 0.5 mg by nebulization 2 (two) times daily as needed (wheezing).   dexamethasone 2 MG tablet Commonly known as: DECADRON Take 1 tablet (2 mg total) by mouth every 6 (six) hours.   famotidine 20 MG tablet Commonly known as: PEPCID Take 20 mg by mouth daily.   fluticasone 50 MCG/ACT nasal spray Commonly known as: FLONASE Place 2 sprays into both  nostrils in the morning and at bedtime.   gemfibrozil 600 MG tablet Commonly known as: LOPID Take 600 mg by mouth 2 (two) times daily before a meal.   insulin aspart 100 UNIT/ML injection Commonly known as: novoLOG Inject 0-15 Units into the skin 3 (three) times daily with meals.   insulin aspart 100 UNIT/ML injection Commonly known as: novoLOG Inject 0-5 Units into the skin at bedtime.   insulin aspart 100 UNIT/ML injection Commonly known as: novoLOG Inject 8 Units into the skin 3 (three) times daily with meals.   insulin glargine-yfgn 100 UNIT/ML injection Commonly known as: SEMGLEE Inject 0.28 mLs (28 Units total) into the skin 2 (two) times daily.    ipratropium-albuterol 0.5-2.5 (3) MG/3ML Soln Commonly known as: DUONEB Take 3 mLs by nebulization every 6 (six) hours as needed (shortness of breath).   isosorbide mononitrate 30 MG 24 hr tablet Commonly known as: IMDUR Take 30 mg by mouth daily.   levETIRAcetam 1000 MG tablet Commonly known as: KEPPRA Take 1 tablet (1,000 mg total) by mouth 2 (two) times daily.   MENS MULTIVITAMIN PO Take 1 tablet by mouth daily.   metoprolol tartrate 100 MG tablet Commonly known as: LOPRESSOR Take 100 mg by mouth 2 (two) times daily.   montelukast 10 MG tablet Commonly known as: SINGULAIR Take 10 mg by mouth every evening.   nitroGLYCERIN 0.4 MG SL tablet Commonly known as: NITROSTAT Place 0.4 mg under the tongue every 5 (five) minutes as needed for chest pain.   oxyCODONE-acetaminophen 10-325 MG tablet Commonly known as: PERCOCET Take 1 tablet by mouth 4 (four) times daily as needed for pain.   pantoprazole 40 MG tablet Commonly known as: PROTONIX Take 40 mg by mouth daily.   QUEtiapine 25 MG tablet Commonly known as: SEROQUEL Take 0.5 tablets (12.5 mg total) by mouth at bedtime.   TRELEGY ELLIPTA IN Inhale 1 puff into the lungs daily.   VITAMIN D3 GUMMIES PO Take 2 tablets by mouth every evening.         Discharge Exam: Filed Weights   10/15/21 2100 10/16/21 0254 10/16/21 2148  Weight: 123.6 kg 123.6 kg 119.7 kg     Condition at discharge: improving  The results of significant diagnostics from this hospitalization (including imaging, microbiology, ancillary and laboratory) are listed below for reference.   Imaging Studies: CT HEAD WO CONTRAST (5MM)  Result Date: 10/27/2021 CLINICAL DATA:  66 year old male postoperative day 7 right posterior temporal lobe craniotomy and tumor resection. Pathology pending. EXAM: CT HEAD WITHOUT CONTRAST TECHNIQUE: Contiguous axial images were obtained from the base of the skull through the vertex without intravenous contrast.  RADIATION DOSE REDUCTION: This exam was performed according to the departmental dose-optimization program which includes automated exposure control, adjustment of the mA and/or kV according to patient size and/or use of iterative reconstruction technique. COMPARISON:  Postoperative head CT 10/21/2021 and earlier. FINDINGS: Brain: Posterior left temporal lobe resection cavity containing some gas and fluid has not significantly changed in size or configuration since 10/21/2021. Small round foci of regional hemorrhage are also stable (series 3, images 19 and 22, up to 13 mm individually). Regional white matter hypodensity appears mildly regressed. Trace left side subdural pneumocephalus persists. Stable trace rightward midline shift. Mild mass effect on the atrium of the left lateral ventricle is stable. No ventriculomegaly. Basilar cisterns remain patent. No new intracranial hemorrhage or evidence of cortically based acute infarction. Vascular: Mild Calcified atherosclerosis at the skull base. Skull: Stable left lateral craniotomy.  No acute osseous abnormality identified. Sinuses/Orbits: Visualized paranasal sinuses and mastoids are stable and well aerated. Other: Evolving postoperative changes to the left scalp. Skin staples remain in place. Negative orbits soft tissues. IMPRESSION: 1. Essentially stable CT appearance of the posterior left temporal resection. Stable resection cavity with small volume adjacent parenchymal blood. Unchanged mild regional mass effect. 2. No new intracranial abnormality. Electronically Signed   By: Genevie Ann M.D.   On: 10/27/2021 04:50   CT HEAD WO CONTRAST (5MM)  Result Date: 10/21/2021 CLINICAL DATA:  Brain neoplasm status post resection EXAM: CT HEAD WITHOUT CONTRAST TECHNIQUE: Contiguous axial images were obtained from the base of the skull through the vertex without intravenous contrast. RADIATION DOSE REDUCTION: This exam was performed according to the departmental  dose-optimization program which includes automated exposure control, adjustment of the mA and/or kV according to patient size and/or use of iterative reconstruction technique. COMPARISON:  Brain MRI 10/18/2021 FINDINGS: Brain: There has been interval left temporoparietal craniotomy for mass resection. The resection cavity in the temporal lobe measures approximately 3.9 cm TV x 1.7 cm AP x 2.1 cm cc. There is a small amount of blood products at the periphery of the cavity. An additional focus of intraparenchymal or subarachnoid blood is seen superior to the resection cavity measuring 1.5 cm by 1.1 cm. There is a small amount of extra-axial fluid, blood, and air overlying the resection site measuring up to approximately 3 mm in thickness in the coronal plane. A small amount of pneumocephalus is also seen overlying the left frontal lobe. There is mild edema surrounding the resection cavity and this additional focus of blood resulting in partial effacement of the left temporal horn and approximally 2 mm rightward midline shift. The remainder of the brain parenchyma is within normal limits. There is no evidence of acute territorial infarct. Vascular: No hyperdense vessel or unexpected calcification. Skull: Postsurgical changes as above. Sinuses/Orbits: The paranasal sinuses are clear. The globes and orbits are unremarkable. Other: There is expected postoperative soft tissue swelling with fluid and gas in the left scalp. IMPRESSION: 1. Postsurgical changes reflecting interval left temporal lobe mass resection with a small amount of blood products at the periphery of the cavity as well as intraparenchymal versus subarachnoid hemorrhage superior to the resection cavity. Edema around the resection cavity and blood products results in partial effacement of the left temporal horn and trace rightward midline shift. 2. No evidence of acute territorial infarct. Electronically Signed   By: Valetta Mole M.D.   On: 10/21/2021 08:46    MR BRAIN WO CONTRAST  Result Date: 10/16/2021 CLINICAL DATA:  Altered mental status EXAM: MRI HEAD WITHOUT CONTRAST TECHNIQUE: Multiplanar, multiecho pulse sequences of the brain and surrounding structures were obtained without intravenous contrast. COMPARISON:  None. FINDINGS: Truncated examination due to patient mental status and inability to tolerate the full examination. Only diffusion-weighted imaging was obtained. There is a mass lesion of the left temporal lobe with associated edema. No midline shift. No other mass is visible on diffusion-weighted imaging. IMPRESSION: 1. Truncated examination due to patient mental status and inability to tolerate the full examination. 2. Mass lesion of the left temporal lobe with associated edema. When possible, a complete MRI with the administration of intravenous contrast is recommended. Electronically Signed   By: Ulyses Jarred M.D.   On: 10/16/2021 03:56   MR BRAIN W WO CONTRAST  Result Date: 10/18/2021 CLINICAL DATA:  Initial evaluation for brain/CNS neoplasm, staging. EXAM: MRI HEAD WITHOUT AND WITH CONTRAST  TECHNIQUE: Multiplanar, multiecho pulse sequences of the brain and surrounding structures were obtained without and with intravenous contrast. CONTRAST:  23mL GADAVIST GADOBUTROL 1 MMOL/ML IV SOLN COMPARISON:  Prior CTs from 10/15/2021. FINDINGS: Brain: Cerebral volume within normal limits. Mild scattered patchy T2/FLAIR hyperintensity noted involving the periventricular and deep white matter both cerebral hemispheres, nonspecific, but most like related chronic microvascular ischemic disease, overall mild in nature in felt to be within normal limits for age. No evidence for acute or subacute infarct. No areas of chronic cortical infarction. No acute intracranial hemorrhage. Rounded mass positioned at the left temporal lobe measures 2.4 x 2.7 x 2.8 cm (AP by transverse by craniocaudad). Lesion demonstrates hyperintense T2 signal intensity with avid  heterogeneous and predominantly peripheral postcontrast enhancement. Speckled areas of internal susceptibility artifact consistent with necrosis and/or blood products. Surrounding T2/FLAIR signal intensity consistent with vasogenic edema and/or infiltrating tumor. Mild mass effect on the atrium of the left lateral ventricle without significant midline shift. 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 No other mass lesion or abnormal enhancement. No hydrocephalus or ventricular trapping. No extra-axial fluid collection. Pituitary gland suprasellar region normal. Midline structures normal. Vascular: Major intracranial vascular flow voids are maintained. Skull and upper cervical spine: Craniocervical junction within normal limits. Bone marrow signal intensity within normal limits. No focal marrow replacing lesion. No scalp soft tissue abnormality. Sinuses/Orbits: Globes and orbital soft tissues demonstrate no acute finding. Scattered mucosal thickening noted within the ethmoidal air cells. Fluid seen layering within the nasopharynx. Small bilateral mastoid effusions. Patient is intubated. Other: None. IMPRESSION: 1. 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 Surrounding vasogenic edema and/or infiltrating tumor without significant midline shift. 2. No other acute intracranial abnormality. Electronically Signed   By: Jeannine Boga M.D.   On: 10/18/2021 19:49   CT CEREBRAL PERFUSION W CONTRAST  Result Date: 10/15/2021 CLINICAL DATA:  Neuro deficit, acute, stroke suspected. EXAM: CT ANGIOGRAPHY HEAD AND NECK CT PERFUSION BRAIN TECHNIQUE: Multidetector CT imaging of the head and neck was performed using the standard protocol during bolus administration of intravenous contrast. Multiplanar CT image reconstructions and MIPs were obtained to evaluate the vascular anatomy. Carotid stenosis  measurements (when applicable) are obtained utilizing NASCET criteria, using the distal internal carotid diameter as the denominator. Multiphase CT imaging of the brain was performed following IV bolus contrast injection. Subsequent parametric perfusion maps were calculated using RAPID software. RADIATION DOSE REDUCTION: This exam was performed according to the departmental dose-optimization program which includes automated exposure control, adjustment of the mA and/or kV according to patient size and/or use of iterative reconstruction technique. CONTRAST:  Not annotated COMPARISON:  Head CT earlier same day FINDINGS: CTA NECK FINDINGS Aortic arch: Aortic atherosclerosis, mild. Right carotid system: Common carotid artery widely patent to the bifurcation. Carotid bifurcation is normal. Left carotid system: Common carotid artery widely patent to the bifurcation. Carotid bifurcation is normal. Vertebral arteries: Vertebral artery origins are poorly seen. Beyond the origins, the vessels are patent through the cervical region to the foramen magnum. Skeleton: Ordinary cervical spondylosis. Other neck: No mass or lymphadenopathy. Upper chest: Small amount of dependent pleural fluid and dependent pulmonary atelectasis. Review of the MIP images confirms the above findings CTA HEAD FINDINGS Anterior circulation: Both internal carotid arteries widely patent through the skull base and siphon regions. The anterior and middle cerebral vessels are patent. No large vessel occlusion. Posterior circulation: Both  vertebral arteries are widely patent to the basilar. No basilar stenosis. No large vessel occlusion. Venous sinuses: Patent and normal. Anatomic variants: None significant. Review of the MIP images confirms the above findings CT Brain Perfusion Findings: ASPECTS: 9 CBF (<30%) Volume: 32mL Perfusion (Tmax>6.0s) volume: 78mL Mismatch Volume: 12mL Infarction Location:None There is actual hyperemia in the left posterior temporal  lobe, with a peripherally enhancing mass lesion visible, measuring about 2.5 cm in size. IMPRESSION: No large vessel occlusion. 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 Electronically Signed   By: Nelson Chimes M.D.   On: 10/15/2021 22:35   DG CHEST PORT 1 VIEW  Result Date: 10/24/2021 CLINICAL DATA:  Cough EXAM: PORTABLE CHEST 1 VIEW COMPARISON:  10/16/2021 FINDINGS: Transverse diameter of heart is increased. There is interval increase in interstitial markings in the left parahilar region and left lower lung fields. Right lung is unremarkable. There is poor inspiration. There is minimal blunting of left lateral CP angle. There is no pneumothorax. Tip of right IJ chest port is seen in the superior vena cava. IMPRESSION: Cardiomegaly. There is interval increase in interstitial markings in the left parahilar region and left lower lung fields suggesting possible interstitial pneumonia. Electronically Signed   By: Elmer Picker M.D.   On: 10/24/2021 10:38   DG CHEST PORT 1 VIEW  Result Date: 10/16/2021 CLINICAL DATA:  COVID-19 infection. EXAM: PORTABLE CHEST 1 VIEW COMPARISON:  None. FINDINGS: The heart is enlarged and the mediastinal contour is within normal limits. Lung volumes are low. No consolidation, effusion, or pneumothorax. A right chest port terminates over the superior vena cava. No acute osseous abnormality. IMPRESSION: 1. Low lung volumes with no acute process. 2. Cardiomegaly. Electronically Signed   By: Brett Fairy M.D.   On: 10/16/2021 04:10   EEG adult  Result Date: 10/16/2021 Greta Doom, MD     10/16/2021  1:46 PM History: 66 yo M with AMS with left temporal mass Sedation: none Technique: This EEG was acquired with electrodes placed according to the International 10-20 electrode system (including Fp1, Fp2, F3, F4, C3, C4, P3, P4, O1, O2, T3, T4, T5, T6, A1, A2, Fz, Cz, Pz). The following electrodes were missing or displaced: none.  Background: There is a posterior dominant rhythm of 9 Hz. There is focal irregular delta activity maximal in the left temporal region. In addition, there are occasioanl left temporal sharp waves, T7, P7 > F7 Photic stimulation: Physiologic driving is not performed. EEG Abnormalities: 1) Left temporal sharp waves 2) Left temporal slow activity Clinical Interpretation: This EEG is consistent with an area of potential epileptogenicity in the left temporal region. There was no seizure recorded on this study. Please note that lack of epileptiform activity on EEG does not preclude the possibility of epilepsy. Roland Rack, MD [facility] [phone] If 7pm- 7am, please page neurology on call as listed in [place].   Overnight EEG with video  Result Date: 10/19/2021 Lora Havens, MD     10/19/2021  3:26 PM Patient Name: Rajinder Mesick Sr. MRN: [id number] Epilepsy Attending: Lora Havens Referring Physician/Provider: Dr Su Monks Duration: 10/18/2021 2129 to 10/19/2021 1154 Patient history: 66 year old male with altered mental status and left temporal mass.  EEG to evaluate for seizure Level of alertness: Awake, asleep AEDs during EEG study: LEV Technical aspects: This EEG study was done with scalp electrodes positioned according to the 10-20 International system of electrode placement. Electrical activity was acquired at  a sampling rate of 500Hz  and reviewed with a high frequency filter of 70Hz  and a low frequency filter of 1Hz . EEG data were recorded continuously and digitally stored. Description: The posterior dominant rhythm consists of 9 Hz activity of moderate voltage (25-35 uV) seen predominantly in posterior head regions, symmetric and reactive to eye opening and eye closing. Sleep was characterized by vertex waves, sleep spindles (12 to 14 Hz), maximal frontocentral region.  EEG showed intermittent 3 to 5 Hz theta-delta slowing in left temporal region which at times appears rhythmic  without definite evolution consistent with lateralized rhythmic delta activity.  Intermittent generalized 3 to 5 Hz theta and delta slowing was also noted. Hyperventilation and photic stimulation were not performed.   ABNORMALITY -Lateralized rhythmic delta activity, left temporal region -Intermittent slow, generalized IMPRESSION: This study is suggestive of cortical dysfunction in left temporal region which is likely secondary to underlying mass.  Of note, lateralized rhythmic activity is on the ictal-interictal continuum with low potential for seizures.  Additionally there is mild to moderate diffuse encephalopathy, nonspecific etiology.  No definite seizures or epileptiform discharges were seen throughout the recording. Lora Havens   CT HEAD CODE STROKE WO CONTRAST  Result Date: 10/15/2021 CLINICAL DATA:  Code stroke. Neuro deficit, acute, stroke suspected. EXAM: CT HEAD WITHOUT CONTRAST TECHNIQUE: Contiguous axial images were obtained from the base of the skull through the vertex without intravenous contrast. RADIATION DOSE REDUCTION: This exam was performed according to the departmental dose-optimization program which includes automated exposure control, adjustment of the mA and/or kV according to patient size and/or use of iterative reconstruction technique. COMPARISON:  None. FINDINGS: Brain: The brainstem and cerebellum are normal. Right cerebral hemisphere is normal. There is a 3-4 cm region of abnormal low-density and mild swelling in the left mid to posterior temporal lobe. This could represent a early subacute infarction mass lesion is not excluded. There may be some internal petechial blood products but there is no frank hematoma. Elsewhere the brain appears normal. No hydrocephalus or extra-axial collection. Vascular: No abnormal vascular finding. Skull: Negative Sinuses/Orbits: Clear/normal Other: None ASPECTS ([place] Stroke Program Early CT Score) - Ganglionic level infarction (caudate,  lentiform nuclei, internal capsule, insula, M1-M3 cortex): 6 - Supraganglionic infarction (M4-M6 cortex): 3 Total score (0-10 with 10 being normal): 9 IMPRESSION: 1. Question 3-4 cm region of subacute infarction versus possibly a mass lesion in the left posterior temporal lobe. There may be some internal petechial blood products but there is no frank hematoma. 2. ASPECTS is 9 3. Case discussed by telephone with Dr. Lorrin Goodell at 2200 hours. Electronically Signed   By: Nelson Chimes M.D.   On: 10/15/2021 22:06   CT ANGIO HEAD NECK W WO CM (CODE STROKE)  Result Date: 10/15/2021 CLINICAL DATA:  Neuro deficit, acute, stroke suspected. EXAM: CT ANGIOGRAPHY HEAD AND NECK CT PERFUSION BRAIN TECHNIQUE: Multidetector CT imaging of the head and neck was performed using the standard protocol during bolus administration of intravenous contrast. Multiplanar CT image reconstructions and MIPs were obtained to evaluate the vascular anatomy. Carotid stenosis measurements (when applicable) are obtained utilizing NASCET criteria, using the distal internal carotid diameter as the denominator. Multiphase CT imaging of the brain was performed following IV bolus contrast injection. Subsequent parametric perfusion maps were calculated using RAPID software. RADIATION DOSE REDUCTION: This exam was performed according to the departmental dose-optimization program which includes automated exposure control, adjustment of the mA and/or kV according to patient size and/or use of iterative reconstruction technique. CONTRAST:  Not annotated COMPARISON:  Head CT earlier same day FINDINGS: CTA NECK FINDINGS Aortic arch: Aortic atherosclerosis, mild. Right carotid system: Common carotid artery widely patent to the bifurcation. Carotid bifurcation is normal. Left carotid system: Common carotid artery widely patent to the bifurcation. Carotid bifurcation is normal. Vertebral arteries: Vertebral artery origins are poorly seen. Beyond the origins, the  vessels are patent through the cervical region to the foramen magnum. Skeleton: Ordinary cervical spondylosis. Other neck: No mass or lymphadenopathy. Upper chest: Small amount of dependent pleural fluid and dependent pulmonary atelectasis. Review of the MIP images confirms the above findings CTA HEAD FINDINGS Anterior circulation: Both internal carotid arteries widely patent through the skull base and siphon regions. The anterior and middle cerebral vessels are patent. No large vessel occlusion. Posterior circulation: Both vertebral arteries are widely patent to the basilar. No basilar stenosis. No large vessel occlusion. Venous sinuses: Patent and normal. Anatomic variants: None significant. Review of the MIP images confirms the above findings CT Brain Perfusion Findings: ASPECTS: 9 CBF (<30%) Volume: 35mL Perfusion (Tmax>6.0s) volume: 21mL Mismatch Volume: 99mL Infarction Location:None There is actual hyperemia in the left posterior temporal lobe, with a peripherally enhancing mass lesion visible, measuring about 2.5 cm in size. IMPRESSION: No large vessel occlusion. 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 Electronically Signed   By: Nelson Chimes M.D.   On: 10/15/2021 22:35    Microbiology: Results for orders placed or performed during the hospital encounter of 10/15/21  Resp Panel by RT-PCR (Flu A&B, Covid) Nasopharyngeal Swab     Status: Abnormal   Collection Time: 10/15/21 11:52 PM   Specimen: Nasopharyngeal Swab; Nasopharyngeal(NP) swabs in vial transport medium  Result Value Ref Range Status   SARS Coronavirus 2 by RT PCR POSITIVE (A) NEGATIVE Final    Comment: (NOTE) SARS-CoV-2 target nucleic acids are DETECTED.  The SARS-CoV-2 RNA is generally detectable in upper respiratory specimens during the acute phase of infection. Positive results are indicative of the presence of the identified virus, but do not rule out bacterial infection or co-infection  with other pathogens not detected by the test. Clinical correlation with patient history and other diagnostic information is necessary to determine patient infection status. The expected result is Negative.  Fact Sheet for Patients: EntrepreneurPulse.com.au  Fact Sheet for Healthcare Providers: IncredibleEmployment.be  This test is not yet approved or cleared by the [place] FDA and  has been authorized for detection and/or diagnosis of SARS-CoV-2 by FDA under an Emergency Use Authorization (EUA).  This EUA will remain in effect (meaning this test can be used) for the duration of  the COVID-19 declaration under Section 564(b)(1) of the A ct, 21 U.S.C. section 360bbb-3(b)(1), unless the authorization is terminated or revoked sooner.     Influenza A by PCR NEGATIVE NEGATIVE Final   Influenza B by PCR NEGATIVE NEGATIVE Final    Comment: (NOTE) The Xpert Xpress SARS-CoV-2/FLU/RSV plus assay is intended as an aid in the diagnosis of influenza from Nasopharyngeal swab specimens and should not be used as a sole basis for treatment. Nasal washings and aspirates are unacceptable for Xpert Xpress SARS-CoV-2/FLU/RSV testing.  Fact Sheet for Patients: EntrepreneurPulse.com.au  Fact Sheet for Healthcare Providers: IncredibleEmployment.be  This test is not yet approved or cleared by the [place] FDA and has been authorized for detection and/or diagnosis of SARS-CoV-2 by FDA under an Emergency Use Authorization (EUA). This EUA will remain in effect (meaning this test can be used)  for the duration of the COVID-19 declaration under Section 564(b)(1) of the Act, 21 U.S.C. section 360bbb-3(b)(1), unless the authorization is terminated or revoked.  Performed at [facility], [place] [address], [place], [place] [address]   Surgical PCR screen     Status: None   Collection Time: 10/19/21  8:09 PM   Specimen:  Nasal Mucosa; Nasal Swab  Result Value Ref Range Status   MRSA, PCR NEGATIVE NEGATIVE Final   Staphylococcus aureus NEGATIVE NEGATIVE Final    Comment: (NOTE) The Xpert SA Assay (FDA approved for NASAL specimens in patients 82 years of age and older), is one component of a comprehensive surveillance program. It is not intended to diagnose infection nor to guide or monitor treatment. Performed at [facility], [place] [address] [place],  [address]     Labs: CBC: Recent Labs  Lab 10/21/21 [phone] 10/23/21 0658 10/24/21 0407 10/25/21 0145 10/26/21 1000  WBC 10.2 7.0 10.0 9.7 13.8*  HGB 12.8* 13.3 15.5 14.8 15.5  HCT 38.5* 40.0 45.6 43.9 45.8  MCV 96.0 95.2 93.3 94.2 93.9  PLT 146* 198 308 279 726   Basic Metabolic Panel: Recent Labs  Lab 10/21/21 0607 10/23/21 0658 10/24/21 0407 10/25/21 0145 10/26/21 1000  NA 139 143 141 140 136  K 3.7 3.6 4.5 3.7 3.7  CL 104 110 107 105 99  CO2 22 22 21* 21* 22  GLUCOSE 245* 193* 149* 235* 398*  BUN 11 18 20  25* 33*  CREATININE 0.95 0.90 0.92 1.06 1.37*  CALCIUM 8.7* 9.0 9.4 9.3 9.4  MG 1.9  --   --   --   --   PHOS 3.6  --   --   --   --    Liver Function Tests: Recent Labs  Lab 10/21/21 0607 10/23/21 0658  AST  --  52*  ALT  --  46*  ALKPHOS  --  55  BILITOT  --  0.7  PROT  --  6.5  ALBUMIN 3.2* 3.0*   CBG: Recent Labs  Lab 10/26/21 1143 10/26/21 1655 10/26/21 2202 10/27/21 0915 10/27/21 1159  GLUCAP 338* 331* 361* 187* 362*    Discharge time spent: greater than 30 minutes.  Signed: Geradine Girt, DO [facility] 10/27/2021

## 2021-10-27 NOTE — Progress Notes (Signed)
INPATIENT REHABILITATION ADMISSION NOTE   Arrival Method: Via Wheelchair from 4N at 1530.      Mental Orientation: A/O x3   Assessment: Complete   Skin: verified with Ed, RN   IV'S: L forearm    Pain: None   Tubes and Drains: None   Safety Measures: Bed alarm on within reach, all 4 side rails up per wife and pt request.    Vital Signs: Complete   Height and Weight:Charted    Rehab Orientation: Done , wife and patient showed understanding.    Family: Notified   Arn Medal LPN

## 2021-10-28 DIAGNOSIS — D496 Neoplasm of unspecified behavior of brain: Secondary | ICD-10-CM

## 2021-10-28 DIAGNOSIS — E669 Obesity, unspecified: Secondary | ICD-10-CM

## 2021-10-28 DIAGNOSIS — Z515 Encounter for palliative care: Secondary | ICD-10-CM

## 2021-10-28 DIAGNOSIS — G441 Vascular headache, not elsewhere classified: Secondary | ICD-10-CM

## 2021-10-28 DIAGNOSIS — R7989 Other specified abnormal findings of blood chemistry: Secondary | ICD-10-CM

## 2021-10-28 DIAGNOSIS — Z66 Do not resuscitate: Secondary | ICD-10-CM

## 2021-10-28 DIAGNOSIS — E1169 Type 2 diabetes mellitus with other specified complication: Secondary | ICD-10-CM | POA: Diagnosis not present

## 2021-10-28 DIAGNOSIS — R531 Weakness: Secondary | ICD-10-CM

## 2021-10-28 LAB — GLUCOSE, CAPILLARY
Glucose-Capillary: 150 mg/dL — ABNORMAL HIGH (ref 70–99)
Glucose-Capillary: 168 mg/dL — ABNORMAL HIGH (ref 70–99)
Glucose-Capillary: 248 mg/dL — ABNORMAL HIGH (ref 70–99)
Glucose-Capillary: 282 mg/dL — ABNORMAL HIGH (ref 70–99)

## 2021-10-28 MED ORDER — DICLOFENAC SODIUM 1 % EX GEL
2.0000 g | Freq: Four times a day (QID) | CUTANEOUS | Status: DC
Start: 2021-10-28 — End: 2021-11-06
  Administered 2021-10-28 – 2021-11-06 (×31): 2 g via TOPICAL
  Filled 2021-10-28 (×2): qty 100

## 2021-10-28 MED ORDER — QUETIAPINE FUMARATE 25 MG PO TABS
25.0000 mg | ORAL_TABLET | Freq: Every day | ORAL | Status: DC
  Administered 2021-10-28: 25 mg via ORAL
  Filled 2021-10-28: qty 1

## 2021-10-28 MED ORDER — TOPIRAMATE 25 MG PO TABS
25.0000 mg | ORAL_TABLET | Freq: Every day | ORAL | Status: DC
Start: 2021-10-28 — End: 2021-11-01
  Administered 2021-10-28 – 2021-10-31 (×4): 25 mg via ORAL
  Filled 2021-10-28 (×4): qty 1

## 2021-10-28 MED ORDER — TOPIRAMATE 25 MG PO TABS
25.0000 mg | ORAL_TABLET | ORAL | Status: AC
  Administered 2021-10-28: 25 mg via ORAL
  Filled 2021-10-28: qty 1

## 2021-10-28 NOTE — Evaluation (Addendum)
Occupational Therapy Assessment and Plan  Patient Details  Name: Jeff Sheldon Sr. MRN: [id number] Date of Birth: [birth year]  OT Diagnosis: acute pain, altered mental status, cognitive deficits, and muscle weakness (generalized) Rehab Potential: Rehab Potential (ACUTE ONLY): Good ELOS: 10-12 days   Today's Date: 10/28/2021 OT Individual Time: 8811-0315 OT Individual Time Calculation (min): 60 min     Hospital Problem: Principal Problem:   Brain tumor [facility])   Past Medical History:  Past Medical History:  Diagnosis Date   Morbid obesity ([facility]) 10/17/2021   Past Surgical History:  Past Surgical History:  Procedure Laterality Date   APPLICATION OF CRANIAL NAVIGATION Left 10/20/2021   Procedure: APPLICATION OF CRANIAL NAVIGATION;  Surgeon: Earnie Larsson, MD;  Location: [place];  Service: Neurosurgery;  Laterality: Left;   CRANIOTOMY Left 10/20/2021   Procedure: Left temporal craniotomy for tumor with Brain Lab;  Surgeon: Earnie Larsson, MD;  Location: [place];  Service: Neurosurgery;  Laterality: Left;   RADIOLOGY WITH ANESTHESIA N/A 10/18/2021   Procedure: MRI WITH ANESTHESIA;  Surgeon: Radiologist, Medication, MD;  Location: [place];  Service: Radiology;  Laterality: N/A;    Assessment & Plan Clinical Impression: Jeff Penman. Gaspar Bidding is a 66 year old right-handed male history of , hyperlipidemia, COPD with 2 L oxygen, CAD on Plavix, DVT on Eliquis, hypertension, type 2 diabetes mellitus.  Per chart review patient lives with spouse.  1 level home 2 steps to entry.  Modified independent prior to admission.  Wife assist with medication management.  Presented 10/15/2021 with acute onset of aphasia.  Cranial CT scan question 3-4 cm region of subacute infarct versus possibly mass lesion in the left posterior temporal lobe.  CT angiogram head and neck no large vessel occlusion.  Hyperemic mass in the left posterior temporal lobe measuring about 2.5 cm in size.  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  Admission chemistries unremarkable aside glucose 230, urine drug screen negative, hemoglobin A1c 9.5, lab test positive detection of COVID-19 virus per wife received initial 2 doses of COVID-vaccine and 1 booster shot.  Chest x-ray without acute findings inflammatory markers were normal initially on precautions and discontinued..  Patient underwent left temporal craniotomy for tumor 10/20/2021 per Dr. Annette Stable.  Maintained on Decadron with slow taper.  EEG negative for seizure and maintained on Keppra for seizure prophylaxis..  Follow-up cranial CT scan 10/26/2021 showed stable resection cavity with small volume adjacent parenchymal blood unchanged mild regional mass effect no new intracranial abnormality..  Pathology report pending to be presented to tumor board and Dr. Mickeal Skinner from medical oncology to follow-up.  Awaiting plan from neurosurgery on when to resume chronic Eliquis..  Tolerating mechanical soft diet.  Therapy evaluations completed due to patient decreased functional mobility cognitive changes was admitted for a comprehensive rehab program. Patient transferred to CIR on 10/27/2021 .    Patient currently requires mod with basic self-care skills secondary to muscle weakness, decreased coordination and decreased motor planning, ideational apraxia, decreased attention, decreased awareness, decreased safety awareness, decreased memory, and delayed processing, and decreased standing balance, decreased postural control, and decreased balance strategies.  Prior to hospitalization, patient could complete ADLs with modified independence.  Patient will benefit from skilled intervention to decrease level of assist with basic self-care skills and increase independence with basic self-care skills prior to discharge home with care partner.  Anticipate patient will require 24  hour  supervision and follow up outpatient.  OT - End of Session Activity Tolerance: Tolerates 30+ min activity with multiple rests Endurance Deficit: Yes Endurance Deficit Description: Quick to fatigue OT Assessment Rehab Potential (ACUTE ONLY): Good OT Barriers to Discharge: Inaccessible home environment OT Patient demonstrates impairments in the following area(s): Balance;Cognition;Endurance;Motor;Pain;Perception;Safety OT Basic ADL's Functional Problem(s): Grooming;Bathing;Dressing;Toileting OT Transfers Functional Problem(s): Toilet;Tub/Shower OT Plan OT Intensity: Minimum of 1-2 x/day, 45 to 90 minutes OT Frequency: 5 out of 7 days OT Duration/Estimated Length of Stay: 10-12 days OT Treatment/Interventions: Balance/vestibular training;Cognitive remediation/compensation;Community reintegration;Discharge planning;DME/adaptive equipment instruction;Functional mobility training;Pain management;Patient/family education;Self Care/advanced ADL retraining;Therapeutic Activities;Therapeutic Exercise;UE/LE Strength taining/ROM;UE/LE Coordination activities OT Self Feeding Anticipated Outcome(s): N/A OT Basic Self-Care Anticipated Outcome(s): S OT Toileting Anticipated Outcome(s): S OT Bathroom Transfers Anticipated Outcome(s): S OT Recommendation Recommendations for Other Services: Neuropsych consult;Therapeutic Recreation consult Therapeutic Recreation Interventions: Pet therapy;Kitchen group;Stress management Patient destination: Home Follow Up Recommendations: Outpatient OT;24 hour supervision/assistance Equipment Recommended: To be determined   OT Evaluation Precautions/Restrictions  Precautions Precautions: Fall Precaution Comments: aphasic Restrictions Weight Bearing Restrictions: No General Chart Reviewed: Yes Additional Pertinent History: PMHx significant for DMII, CAD and Hx of DVT. Family/Caregiver Present: No Vital Signs  Pain Pain Assessment Pain Scale: 0-10 Pain Score:  2  Pain Location: Hand Pain Descriptors / Indicators: Aching Home Living/Prior Functioning Home Living Family/patient expects to be discharged to:: Private residence Living Arrangements: Spouse/significant other Available Help at Discharge: Family, Available 24 hours/day Type of Home: Mudlogger of Steps: 2 Entrance Stairs-Rails: None Home Layout: One level Bathroom Shower/Tub: Chiropodist: Handicapped height Bathroom Accessibility: No  Lives With: Spouse, Daughter Vision Baseline Vision/History: 1 Wears glasses (Readers) Ability to See in Adequate Light: 0 Adequate Patient Visual Report: No change from baseline Vision Assessment?: No apparent visual deficits Additional Comments: Appears within functional Perception  Perception: Within Functional Limits Praxis Praxis: Impaired Praxis Impairment Details: Motor planning Praxis-Other Comments: Impaired with functional assessment during ADLs Cognition Overall Cognitive Status: Impaired/Different from baseline Arousal/Alertness: Awake/alert Orientation Level: Person;Situation;Place Person: Oriented Place: Oriented (Although states States "[facility]") Year: 2023 Month: March (Initially states "december" but self-corrects) Memory: Appears intact Immediate Memory Recall: Sock;Blue;Bed Memory Recall Sock: Without Cue Memory Recall Blue: Without Cue Memory Recall Bed: Without Cue Attention: Focused;Sustained Focused Attention: Impaired Focused Attention Impairment: Verbal complex Sustained Attention: Impaired Sustained Attention Impairment: Verbal basic Awareness: Impaired Awareness Impairment: Emergent impairment Problem Solving: Impaired Safety/Judgment: Impaired Comments: Cues for safety Sensation Sensation Light Touch: Appears Intact (WFL in BUE; 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) Coordination Gross Motor Movements are Fluid and Coordinated: No Fine Motor Movements are  Fluid and Coordinated: No Coordination and Movement Description: Mildly impaired vs baseline Finger Nose Finger Test: Difficult to assess 2/2 limited AROM in bilateral shoulders Motor  Motor Motor: Motor apraxia Motor - Skilled Clinical Observations: With functional assessment  Trunk/Postural Assessment  Cervical Assessment Cervical Assessment: Exceptions to [facility] (Forward head) Thoracic Assessment Thoracic Assessment: Exceptions to [facility] (Rounded shoulders) Lumbar Assessment Lumbar Assessment: Exceptions to [facility] Postural Control Postural Control: Deficits on evaluation (Delayed; insufficient)  Balance Balance Balance Assessed: Yes Static Sitting Balance Static Sitting - Balance Support: No upper extremity supported Static Sitting - Level of Assistance: 5: Stand by assistance Dynamic Sitting Balance Dynamic Sitting - Balance Support: No upper extremity supported Dynamic Sitting - Level of Assistance: 5: Stand by assistance (Min guard) Static Standing Balance Static Standing - Balance Support:  (Unilateral vs bilateral UE support) Static Standing - Level of Assistance:  5: Stand by assistance (Min guard) Dynamic Standing Balance Dynamic Standing - Balance Support: Bilateral upper extremity supported Dynamic Standing - Level of Assistance: 4: Min assist Extremity/Trunk Assessment RUE Assessment RUE Assessment: Exceptions to [facility] Passive Range of Motion (PROM) Comments: Greatly limited shoulder ROM at baseline General Strength Comments: Difficult to assess at shoulder 2/2 AROM deficits; MMT at elbow, wrist and digits grossly 3/5. LUE Assessment LUE Assessment: Exceptions to [facility] Passive Range of Motion (PROM) Comments: Greatly limited shoulder ROM at baseline General Strength Comments: Difficult to assess at shoulder 2/2 AROM deficits; MMT at elbow, wrist and digits grossly 3/5.  Care Tool Care Tool Self Care Eating Eating activity did not occur: Refused      Oral Care    Oral  Care Assist Level: Minimal Assistance - Patient > 75%    Bathing   Body parts bathed by patient: Right arm;Left arm;Chest;Abdomen;Front perineal area;Right upper leg;Left upper leg;Face Body parts bathed by helper: Buttocks;Right lower leg;Left lower leg   Assist Level: Moderate Assistance - Patient 50 - 74%    Upper Body Dressing(including orthotics)   What is the patient wearing?: Pull over shirt   Assist Level: Moderate Assistance - Patient 50 - 74%    Lower Body Dressing (excluding footwear)   What is the patient wearing?: Pants Assist for lower body dressing: Moderate Assistance - Patient 50 - 74%    Putting on/Taking off footwear   What is the patient wearing?: Non-skid slipper socks Assist for footwear: Maximal Assistance - Patient 25 - 49%       Care Tool Toileting Toileting activity   Assist for toileting: Minimal Assistance - Patient > 75%     Care Tool Bed Mobility Roll left and right activity Roll left and right activity did not occur: N/A      Sit to lying activity Sit to lying activity did not occur: N/A      Lying to sitting on side of bed activity Lying to sitting on side of bed activity did not occur: the ability to move from lying on the back to sitting on the side of the bed with no back support.: N/A       Care Tool Transfers Sit to stand transfer   Sit to stand assist level: Minimal Assistance - Patient > 75%    Chair/bed transfer   Chair/bed transfer assist level: Minimal Assistance - Patient > 75%     Toilet transfer   Assist Level: Minimal Assistance - Patient > 75%     Care Tool Cognition  Expression of Ideas and Wants Expression of Ideas and Wants: 2. Frequent difficulty - frequently exhibits difficulty with expressing needs and ideas  Understanding Verbal and Non-Verbal Content Understanding Verbal and Non-Verbal Content: 2. Sometimes understands - understands only basic conversations or simple, direct phrases. Frequently requires cues to  understand   Memory/Recall Ability Memory/Recall Ability : That he or she is in a hospital/hospital unit   Refer to Care Plan for Long Term Goals  SHORT TERM GOAL WEEK 1 OT Short Term Goal 1 (Week 1): Patient will don overhead clothing with Min A. OT Short Term Goal 2 (Week 1): Patient will don LB clothing with Min A. OT Short Term Goal 3 (Week 1): Patient will complete 3/3 grooming tasks in standing with supervision A and no more than 2 verbal cues for sequencing. OT Short Term Goal 4 (Week 1): Patient will complete toilet transfers with CGA and LRAD.  Recommendations for other services: Neuropsych  and Therapeutic Recreation  Pet therapy and Stress management   Skilled Therapeutic Intervention Patient met seated in recliner in agreement with OT treatment session. Mild pain reported at rest and with activity in bilateral shoulders and buttocks. Repositioning in recliner decreased discomfort in buttocks. Discussed purpose of rehab, goals, ELOS and role of OT. Patient expressed verbal understanding but would benefit from further education. Sit to stand from low recliner and from [facility] with Min A overall and cues for safe hand placement. Toilet transfer and 3/3 parts of toileting task with Min A overall. Patient attempting to stand from commode despite education to remain seated. At sink level patient completed UB/LB bathing/dressing with Mod A overall and cues for sequencing. Please refer below for more details. Patient limited by deficits listed above including decreased cognition, aphasia, apraxia, balance deficits and deficits in activity tolerance and would benefit from intensive inpatient rehab to maximize safety/independence in prep for safe d/c home with spouse and daughter. Session concluded with patient seated in recliner with call bell within reach, belt alarm activated and all needs met.   ADL ADL Eating: Not assessed Grooming: Minimal assistance;Moderate cueing Where Assessed-Grooming:  Chair Upper Body Bathing: Moderate assistance Where Assessed-Upper Body Bathing: Chair Lower Body Bathing: Moderate assistance Where Assessed-Lower Body Bathing: Chair Upper Body Dressing: Moderate assistance Where Assessed-Upper Body Dressing: Chair Lower Body Dressing: Moderate assistance Where Assessed-Lower Body Dressing: Chair Toileting: Minimal assistance Where Assessed-Toileting: Glass blower/designer: Psychiatric nurse Method: Counselling psychologist: Radiographer, therapeutic: Not assessed ADL Comments: Min to [employer] verbal/tactile cues for sequencing. Mobility  Transfers Sit to Stand: Minimal Assistance - Patient > 75% Stand to Sit: Minimal Assistance - Patient > 75%   Discharge Criteria: Patient will be discharged from OT if patient refuses treatment 3 consecutive times without medical reason, if treatment goals not met, if there is a change in medical status, if patient makes no progress towards goals or if patient is discharged from hospital.  The above assessment, treatment plan, treatment alternatives and goals were discussed and mutually agreed upon: by patient  Jamesrobert Ohanesian R Howerton-Davis 10/28/2021, 9:43 AM

## 2021-10-28 NOTE — Progress Notes (Signed)
Fall prevention education given to pts wife and daughter at this time. Both voice understanding of importance of bed/chair alarms. Also voiced understanding of calling staff to assist pt up and not attempting to do so by themselves.  ?

## 2021-10-28 NOTE — Plan of Care (Signed)
?  Problem: RH Swallowing ?Goal: LTG Patient will consume least restrictive diet using compensatory strategies with assistance (SLP) ?Description: LTG:  Patient will consume least restrictive diet using compensatory strategies with assistance (SLP) ?Flowsheets (Taken 10/28/2021 1758) ?LTG: Pt Patient will consume least restrictive diet using compensatory strategies with assistance of (SLP): Supervision ?  ?Problem: RH Comprehension Communication ?Goal: LTG Patient will comprehend basic/complex auditory (SLP) ?Description: LTG: Patient will comprehend basic/complex auditory information with cues (SLP). ?Flowsheets (Taken 10/28/2021 1758) ?LTG: Patient will comprehend: Basic auditory information ?LTG: Patient will comprehend auditory information with cueing (SLP): Minimal Assistance - Patient > 75% ?  ?Problem: RH Expression Communication ?Goal: LTG Patient will express needs/wants via multi-modal(SLP) ?Description: LTG:  Patient will express needs/wants via multi-modal communication (gestures/written, etc) with cues (SLP) ?Flowsheets (Taken 10/28/2021 1758) ?LTG: Patient will express needs/wants via multimodal communication (gestures/written, etc) with cueing (SLP): ? Minimal Assistance - Patient > 75% ? Moderate Assistance - Patient 50 - 74% ?Goal: LTG Patient will increase word finding of common (SLP) ?Description: LTG:  Patient will increase word finding of common objects/daily info/abstract thoughts with cues using compensatory strategies (SLP). ?Flowsheets (Taken 10/28/2021 1758) ?LTG: Patient will increase word finding of common (SLP): ? Minimal Assistance - Patient > 75% ? Moderate Assistance - Patient 50 - 74% ?Patient will use compensatory strategies to increase word finding of: ? Common objects ? Daily info ?  ?Problem: RH Attention ?Goal: LTG Patient will demonstrate this level of attention during functional activites (SLP) ?Description: LTG:  Patient will will demonstrate this level of attention during functional  activites (SLP) ?Flowsheets (Taken 10/28/2021 1758) ?Patient will demonstrate during cognitive/linguistic activities the attention type of: Sustained ?LTG: Patient will demonstrate this level of attention during cognitive/linguistic activities with assistance of (SLP): Minimal Assistance - Patient > 75% ?Number of minutes patient will demonstrate attention during cognitive/linguistic activities: 20 ?  ?

## 2021-10-28 NOTE — Progress Notes (Signed)
Patient ID: Jeff Wood., male   DOB: 1955/12/08, 66 y.o.   MRN: [id number] ? ? ? ?Progress Note from the Palliative Medicine Team at [facility] ? ? ?Patient Name: Jeff Wood.        ?Date: 10/28/2021 ?DOB: [birth year]  Age: 66 y.o. MRN#: [id number] ?Attending Physician: Meredith Staggers, MD ?Primary Care Physician: Pcp, No ?Admit Date: 10/27/2021 ? ? ?Medical records reviewed  ? ? 66 y.o. 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    UDS negative.   ?  ?CT head concerning for 3 to 4 cm region of subacute infarction versus mass in left posterior temporal lobe.  Neurology consulted.  He was started on Keppra 500 mg twice daily. Spot and LTM EEG without seizure or epileptiform discharge.   ?  ?CT angio head and neck and CT cerebral perfusion with contrast without LVO but showed 2.5 cm hyperemic mass in left posterior temporal lobe.   ?  ?MRI brain without contrast also showed mass lesion of the left temporal lobe with associated edema but truncated exam due to patient's inability to tolerate setting of mental status change. ?  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   ?  ?Neurosurgery consulted and started Decadron. Left temporal craniotomy and resection of tumor on 2/21.    ?  ?Follow-up cranial CT scan 10/26/2021 showed stable resection cavity with small volume adjacent parenchymal blood unchanged mild regional mass effect no new intracranial abnormality. ? ?Patient has been been moved to CIR and is working hard with therapies. ?  ? ?This NP visited patient at the bedside as a follow up for palliative medicine needs and  emotional support. ? ?Patient's wife and daughter at bedside. ? ?Continued conversation regarding current medical situation, treatment option decisions moving forward, goals of care, and anticipatory care needs. ? ?Plan of care ?-DNR/DNI-documented today ?-No artificial feeding now or in the future ?-Complete stay at [facility] and transition home ?-Follow-up with outpatient oncology for ongoing treatment options ? ?     Ultimately comfort is a priority.  All hope for improvement and continued quality of life ? ? ?Discussed with family the importance of continued conversation with each other and the medical providers regarding overall plan of care and treatment options,  ensuring decisions are within the context of the patients values and GOCs. ? ?Wife brought in healthcare power of attorney and living will documents.  Documents reviewed copied and in hard chart for scanning. ? ?Education offered to patient's wife specifically to self-care and respite for herself while her husband is here in the hospital receiving care. ? ?Questions and concerns addressed   ?  ?Total time spent on the unit was 60 minutes ? ?Greater than 50% of the time was spent in counseling and coordination of care ? ?Jeff Lessen NP  ?Palliative Medicine Team Team Phone # [phone] ?Pager [phone] ?  ?

## 2021-10-28 NOTE — Progress Notes (Signed)
Around 4 am Pt agreed go back to bed and finally fall asleep. ?

## 2021-10-28 NOTE — Progress Notes (Signed)
?                                                       PROGRESS NOTE ? ? ?Subjective/Complaints: ?Pt had a bit of a restless night. Couldn't get settled. Still having 8/10 headache which has been consistent ? ?ROS: Patient denies fever, rash, sore throat, blurred vision, dizziness, nausea, vomiting, diarrhea, cough, shortness of breath or chest pain, joint or back/neck pain,  or mood change.  ? ? ?Objective: ?  ?CT HEAD WO CONTRAST (5MM) ? ?Result Date: 10/27/2021 ?CLINICAL DATA:  66 year old male postoperative day 7 right posterior temporal lobe craniotomy and tumor resection. Pathology pending. EXAM: CT HEAD WITHOUT CONTRAST TECHNIQUE: Contiguous axial images were obtained from the base of the skull through the vertex without intravenous contrast. RADIATION DOSE REDUCTION: This exam was performed according to the departmental dose-optimization program which includes automated exposure control, adjustment of the mA and/or kV according to patient size and/or use of iterative reconstruction technique. COMPARISON:  Postoperative head CT 10/21/2021 and earlier. FINDINGS: Brain: Posterior left temporal lobe resection cavity containing some gas and fluid has not significantly changed in size or configuration since 10/21/2021. Small round foci of regional hemorrhage are also stable (series 3, images 19 and 22, up to 13 mm individually). Regional white matter hypodensity appears mildly regressed. Trace left side subdural pneumocephalus persists. Stable trace rightward midline shift. Mild mass effect on the atrium of the left lateral ventricle is stable. No ventriculomegaly. Basilar cisterns remain patent. No new intracranial hemorrhage or evidence of cortically based acute infarction. Vascular: Mild Calcified atherosclerosis at the skull base. Skull: Stable left lateral craniotomy. No acute osseous abnormality identified. Sinuses/Orbits: Visualized paranasal sinuses and mastoids are stable and well aerated. Other:  Evolving postoperative changes to the left scalp. Skin staples remain in place. Negative orbits soft tissues. IMPRESSION: 1. Essentially stable CT appearance of the posterior left temporal resection. Stable resection cavity with small volume adjacent parenchymal blood. Unchanged mild regional mass effect. 2. No new intracranial abnormality. Electronically Signed   By: Genevie Ann M.D.   On: 10/27/2021 04:50   ?Recent Labs  ?  10/26/21 ?1000 10/27/21 ?1718  ?WBC 13.8* 14.5*  ?HGB 15.5 14.9  ?HCT 45.8 44.7  ?PLT 246 185  ? ?Recent Labs  ?  10/26/21 ?1000 10/27/21 ?1718  ?NA 136 136  ?K 3.7 3.7  ?CL 99 101  ?CO2 22 24  ?GLUCOSE 398* 222*  ?BUN 33* 29*  ?CREATININE 1.37* 1.04  ?CALCIUM 9.4 9.3  ? ? ?Intake/Output Summary (Last 24 hours) at 10/28/2021 0847 ?Last data filed at 10/28/2021 0745 ?Gross per 24 hour  ?Intake 240 ml  ?Output --  ?Net 240 ml  ?  ? ?  ? ?Physical Exam: ?Vital Signs ?Blood pressure (!) 144/107, pulse (!) 59, temperature (!) 97.5 ?F (36.4 ?C), temperature source Oral, resp. rate 17, height 5\' 10"  (1.778 m), SpO2 96 %. ? ?General: Alert and oriented x 3, No apparent distress, obese ?HEENT: Head is normocephalic, atraumatic, PERRLA, EOMI, sclera anicteric, oral mucosa pink and moist, dentition intact, ext ear canals clear,  ?Neck: Supple without JVD or lymphadenopathy ?Heart: Reg rate and rhythm. No murmurs rubs or gallops ?Chest: CTA bilaterally without wheezes, rales, or rhonchi; no distress ?Abdomen: Soft, non-tender, non-distended, bowel sounds positive. ?Extremities: No clubbing, cyanosis, or edema.  Pulses are 2+ ?Psych: a little flat but generally cooperative ?Skin: Clean dry. Crani incision cdi with staples. Two drain incisions over eyes with staples cdi also ?Neuro:  Pt is alert and oriented x 3 with some delay. 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 Follows basic commands. Speech sl dysarthric. Normal language. No focal CN findings. 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 Moves all 4's however 3-4/5. Decreased LT in stocking glove distribution in each lower leg and bilateral hands ?Musculoskeletal: no focal tenderness.   ? ? ?Assessment/Plan: ?1. Functional deficits which require 3+ hours per day of interdisciplinary therapy in a comprehensive inpatient rehab setting. ?Physiatrist is providing close team supervision and 24 hour management of active medical problems listed below. ?Physiatrist and rehab team continue to assess barriers to discharge/monitor patient progress toward functional and medical goals ? ?Care Tool: ? ?Bathing ?   ?   ?   ?  ?  ?Bathing assist   ?  ?  ?Upper Body Dressing/Undressing ?Upper body dressing   ?What is the patient wearing?: Pull over shirt ?   ?Upper body assist   ?   ?Lower Body Dressing/Undressing ?Lower body dressing ? ? ?   ?What is the patient wearing?: Pants ? ?  ? ?Lower body assist Assist for lower body dressing: Moderate Assistance - Patient 50 - 74% ?   ? ?Toileting ?Toileting    ?Toileting assist Assist for toileting: Moderate Assistance - Patient 50 - 74% ?  ?  ?Transfers ?Chair/bed transfer ? ?Transfers assist ?   ? ?Chair/bed transfer assist level: Moderate Assistance - Patient 50 - 74% ?  ?  ?Locomotion ?Ambulation ? ? ?Ambulation assist ? ?   ? ?  ?  ?   ? ?Walk 10 feet activity ? ? ?Assist ?   ? ?  ?   ? ?Walk 50 feet activity ? ? ?Assist   ? ?  ?   ? ? ?Walk 150 feet activity ? ? ?Assist   ? ?  ?  ?  ? ?Walk 10 feet on uneven surface  ?activity ? ? ?Assist   ? ? ?  ?   ? ?Wheelchair ? ? ? ? ?Assist   ?  ?  ? ?  ?   ? ? ?Wheelchair 50 feet with 2 turns activity ? ? ? ?Assist ? ?  ?  ? ? ?   ? ?Wheelchair 150 feet activity  ? ? ? ?Assist ?   ? ? ?   ? ?Blood pressure (!) 144/107, pulse (!) 59, temperature (!) 97.5 ?F (36.4 ?C), temperature source Oral, resp. rate 17, height 5\' 10"  (1.778 m), SpO2 96 %. ? ?Medical Problem List and Plan: ?1. 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  Status post right  craniotomy resection of tumor worrisome for glioblastoma 10/20/2021.  ?-DECADRON TAPER.  Medical oncology Dr. Mickeal Skinner to follow-up on plan of care ?            -patient may shower ?            -ELOS/Goals: 10-12 days, supervision to min assist goals ? -Patient is beginning CIR therapies today including PT, OT, and SLP  ?2.  Antithrombotics: ?-DVT/anticoagulation: Awaiting plan from neurosurgery when chronic Eliquis can be resumed. ?            -antiplatelet therapy: N/A ?3. Pain Management/headaches: Oxycodone as needed ? -begin topamax trial 25mg   now and then qhs ?4. Mood/sleep: Provide emotional support ?            -antipsychotic agents: will adjust seroquel to 25mg  qhs ?5. Neuropsych: This patient is capable of making decisions on his own behalf. ?6. Skin/Wound Care: Routine skin checks ?            -continue staples for now ?7. Fluids/Electrolytes/Nutrition: encourage PO ? -labs yesterday: still prerenal azo ?  -push fluids ?  -recheck Friday ?8.  Seizure prophylaxis.  Keppra 1000 mg twice daily ?9.  History of CAD.  Plavix remains on hold.  No chest pain or shortness of breath.  Continue Imdur 30 mg daily and nitroglycerin as needed ?10.  Diabetes mellitus.  Hemoglobin A1c 9.6.  NovoLog 8 units 3 times daily, Semglee 28 units twice daily. ? CBG (last 3)  ?Recent Labs  ?  10/27/21 ?1700 10/27/21 ?2121 10/28/21 ?0627  ?GLUCAP 207* 314* 150*  ?  3/1 remain elevated. Follow for pattern ?11.  COPD.  Oxygen dependent 2 L prior to admission.  Continue nebulizers as directed ?12.  Hypertension.  Lopressor 100 mg twice daily ?  ? ?LOS: ?1 days ?A FACE TO FACE EVALUATION WAS PERFORMED ? ?Meredith Staggers ?10/28/2021, 8:47 AM  ? ?  ?

## 2021-10-28 NOTE — Progress Notes (Signed)
Inpatient Rehabilitation  Patient information reviewed and entered into eRehab system by Yi Falletta Tuan Tippin, OTR/L.   Information including medical coding, functional ability and quality indicators will be reviewed and updated through discharge.    

## 2021-10-28 NOTE — Evaluation (Signed)
Physical Therapy Assessment and Plan  Patient Details  Name: Jeff Wood. MRN: [id number] Date of Birth: [birth year]  PT Diagnosis: Difficulty walking, Hemiplegia dominant, and Muscle weakness Rehab Potential: Good ELOS: 10-12 days   Today's Date: 10/28/2021 PT Individual Time: 6226-3335 and [id number] PT Individual Time Calculation (min): 58 min  and 27 min  Hospital Problem: Principal Problem:   Brain tumor [facility])   Past Medical History:  Past Medical History:  Diagnosis Date   Morbid obesity ([place]) 10/17/2021   Past Surgical History:  Past Surgical History:  Procedure Laterality Date   APPLICATION OF CRANIAL NAVIGATION Left 10/20/2021   Procedure: APPLICATION OF CRANIAL NAVIGATION;  Surgeon: Earnie Larsson, MD;  Location: [place];  Service: Neurosurgery;  Laterality: Left;   CRANIOTOMY Left 10/20/2021   Procedure: Left temporal craniotomy for tumor with Brain Lab;  Surgeon: Earnie Larsson, MD;  Location: [place];  Service: Neurosurgery;  Laterality: Left;   RADIOLOGY WITH ANESTHESIA N/A 10/18/2021   Procedure: MRI WITH ANESTHESIA;  Surgeon: Radiologist, Medication, MD;  Location: [place];  Service: Radiology;  Laterality: N/A;    Assessment & Plan Clinical Impression: Patient is a 66 year old right-handed male history of , hyperlipidemia, COPD with 2 L oxygen, CAD on Plavix, DVT on Eliquis, hypertension, type 2 diabetes mellitus.  Per chart review patient lives with spouse.  1 level home 2 steps to entry.  Modified independent prior to admission.  Wife assist with medication management.  Presented 10/15/2021 with acute onset of aphasia.  Cranial CT scan question 3-4 cm region of subacute infarct versus possibly mass lesion in the left posterior temporal lobe.  CT angiogram head and neck no large vessel occlusion.  Hyperemic mass in the left posterior temporal lobe measuring about 2.5 cm in size.  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  Admission chemistries unremarkable aside glucose 230, urine drug screen negative, hemoglobin A1c 9.5, lab test positive detection of COVID-19 virus per wife received initial 2 doses of COVID-vaccine and 1 booster shot.  Chest x-ray without acute findings inflammatory markers were normal initially on precautions and discontinued..  Patient underwent left temporal craniotomy for tumor 10/20/2021 per Dr. Annette Stable.  Maintained on Decadron with slow taper.  EEG negative for seizure and maintained on Keppra for seizure prophylaxis..  Follow-up cranial CT scan 10/26/2021 showed stable resection cavity with small volume adjacent parenchymal blood unchanged mild regional mass effect no new intracranial abnormality..  Pathology report pending to be presented to tumor board and Dr. Mickeal Skinner from medical oncology to follow-up.  Awaiting plan from neurosurgery on when to resume chronic Eliquis..  Tolerating mechanical soft diet.  Patient transferred to CIR on 10/27/2021 .   Patient currently requires min with mobility secondary to muscle weakness, decreased cardiorespiratoy endurance, decreased coordination, decreased awareness, decreased problem solving, and decreased safety awareness, and decreased sitting balance, decreased standing balance, decreased postural control, hemiplegia, and decreased balance strategies.  Prior to hospitalization, patient was modified independent  with mobility and lived with Spouse, Daughter in a House home.  Home access is 3Stairs to enter.  Patient will benefit from skilled PT intervention to maximize safe functional mobility, minimize fall risk, and decrease caregiver burden for planned discharge home with 24 hour supervision.  Anticipate patient will benefit from follow up OP at discharge.  PT - End of Session Activity Tolerance: Tolerates 30+ min  activity with multiple rests Endurance Deficit:  Yes Endurance Deficit Description: Quick to fatigue PT Assessment Rehab Potential (ACUTE/IP ONLY): Good PT Patient demonstrates impairments in the following area(s): Balance;Endurance;Behavior;Motor;Pain;Perception;Safety;Sensory PT Transfers Functional Problem(s): Bed Mobility;Bed to Chair;Car;Furniture PT Locomotion Functional Problem(s): Ambulation;Stairs PT Plan PT Intensity: Minimum of 1-2 x/day ,45 to 90 minutes PT Frequency: 5 out of 7 days PT Duration Estimated Length of Stay: 10-12 days PT Treatment/Interventions: Ambulation/gait training;Community reintegration;DME/adaptive equipment instruction;Neuromuscular re-education;Psychosocial support;Stair training;UE/LE Strength taining/ROM;Balance/vestibular training;Discharge planning;Functional electrical stimulation;Pain management;Skin care/wound management;Therapeutic Activities;UE/LE Coordination activities;Cognitive remediation/compensation;Disease management/prevention;Functional mobility training;Patient/family education;Therapeutic Exercise;Visual/perceptual remediation/compensation PT Transfers Anticipated Outcome(s): Supervision PT Locomotion Anticipated Outcome(s): Supervision PT Recommendation Follow Up Recommendations: 24 hour supervision/assistance;Outpatient PT Patient destination: Home Equipment Recommended: To be determined   PT Evaluation Precautions/Restrictions Precautions Precautions: Fall Precaution Comments: aphasic Restrictions Weight Bearing Restrictions: No General Chart Reviewed: Yes Family/Caregiver Present: Yes Vital Signs Pain Pain Assessment Pain Scale: 0-10 Pain Score: 8  Pain Type: Acute pain Pain Location: Leg Pain Orientation: Lower;Right;Left Pain Descriptors / Indicators: Aching Pain Frequency: Constant Pain Onset: On-going Pain Intervention(s): Medication (See eMAR) Pain Interference Pain Interference Pain Effect on Sleep: 1. Rarely or not at all Pain Interference with Therapy  Activities: 1. Rarely or not at all Pain Interference with Day-to-Day Activities: 2. Occasionally Home Living/Prior Functioning Home Living Available Help at Discharge: Family;Available 24 hours/day Type of Home: House Home Access: Stairs to enter [employer] of Steps: 3 Entrance Stairs-Rails: None Home Layout: One level  Lives With: Spouse;Daughter Prior Function Level of Independence: Requires assistive device for independence;Independent with transfers;Independent with basic ADLs  Able to Take Stairs?: Yes Driving: Yes Leisure: Hobbies-yes (Comment) (Loves shooting guns) Vision/Perception  Vision - History Ability to See in Adequate Light: 0 Adequate Perception Perception: Within Functional Limits Praxis Praxis: Impaired Praxis Impairment Details: Motor planning  Cognition Overall Cognitive Status: Impaired/Different from baseline Arousal/Alertness: Awake/alert Orientation Level: Oriented to person;Oriented to place Awareness: Impaired Problem Solving: Impaired Safety/Judgment: Impaired Comments: Cues for safety Sensation Sensation Light Touch: Impaired by gross assessment Additional Comments: Impaired but intact in bilateral lower extremities secondary to bsaeline neuropathy Coordination Gross Motor Movements are Fluid and Coordinated: No Fine Motor Movements are Fluid and Coordinated: No Coordination and Movement Description: Mildly impaired vs baseline Finger Nose Finger Test: Difficult to assess 2/2 limited AROM in bilateral shoulders Motor  Motor Motor: Motor apraxia   Trunk/Postural Assessment  Cervical Assessment Cervical Assessment:  (foward head) Thoracic Assessment Thoracic Assessment:  (rounded shoulders) Lumbar Assessment Lumbar Assessment:  (posterior pelvic tilt) Postural Control Postural Control: Deficits on evaluation (delayed, insufficient)  Balance Balance Balance Assessed: Yes Static Sitting Balance Static Sitting - Balance  Support: No upper extremity supported Static Sitting - Level of Assistance: 5: Stand by assistance Dynamic Sitting Balance Dynamic Sitting - Balance Support: No upper extremity supported Dynamic Sitting - Level of Assistance: 5: Stand by assistance Static Standing Balance Static Standing - Balance Support: Bilateral upper extremity supported Static Standing - Level of Assistance: 5: Stand by assistance Dynamic Standing Balance Dynamic Standing - Balance Support: Bilateral upper extremity supported;During functional activity Dynamic Standing - Level of Assistance: 4: Min assist Extremity Assessment      RLE Assessment RLE Assessment: Exceptions to [facility] General Strength Comments: Grossly 3+/5 LLE Assessment LLE Assessment: Exceptions to [facility] General Strength Comments: Grossly 4-/5  Care Tool Care Tool Bed Mobility Roll left and right activity Roll left and right activity did not occur: N/A      Sit to lying activity  Sit to lying assist level: Minimal Assistance - Patient > 75%    Lying to sitting on side of bed activity   Lying to sitting on side of bed assist level: the ability to move from lying on the back to sitting on the side of the bed with no back support.: Minimal Assistance - Patient > 75%     Care Tool Transfers Sit to stand transfer   Sit to stand assist level: Moderate Assistance - Patient 50 - 74%    Chair/bed transfer   Chair/bed transfer assist level: Minimal Assistance - Patient > 75%     Toilet transfer   Assist Level: Minimal Assistance - Patient > 75%    Car transfer   Car transfer assist level: Minimal Assistance - Patient > 75%      Care Tool Locomotion Ambulation   Assist level: Minimal Assistance - Patient > 75% Assistive device: Cane-straight Max distance: 100'  Walk 10 feet activity   Assist level: Minimal Assistance - Patient > 75% Assistive device: Cane-straight   Walk 50 feet with 2 turns activity   Assist level: Minimal Assistance -  Patient > 75% Assistive device: Cane-straight  Walk 150 feet activity Walk 150 feet activity did not occur: Safety/medical concerns      Walk 10 feet on uneven surfaces activity   Assist level: Minimal Assistance - Patient > 75%    Stairs   Assist level: Maximal Assistance - Patient 25 - 49% Stairs assistive device: 2 hand rails Max number of stairs: 8  Walk up/down 1 step activity   Walk up/down 1 step (curb) assist level: Minimal Assistance - Patient > 75% Walk up/down 1 step or curb assistive device: 2 hand rails  Walk up/down 4 steps activity   Walk up/down 4 steps assist level: Minimal Assistance - Patient > 75% Walk up/down 4 steps assistive device: 2 hand rails  Walk up/down 12 steps activity Walk up/down 12 steps activity did not occur: Safety/medical concerns      Pick up small objects from floor Pick up small object from the floor (from standing position) activity did not occur: Safety/medical concerns      Wheelchair Is the patient using a wheelchair?: No          Wheel 50 feet with 2 turns activity      Wheel 150 feet activity        Refer to Care Plan for Long Term Goals  SHORT TERM GOAL WEEK 1 PT Short Term Goal 1 (Week 1): Pt will complete bed moiblity consistently with CGA. PT Short Term Goal 2 (Week 1): Pt will complete bed to chair transfer consistently with CGA. PT Short Term Goal 3 (Week 1): Pt will ambulate x150' with CGA and LRAD. PT Short Term Goal 4 (Week 1): Pt will ascend/descend 2 steps with minA and LRAD.  Recommendations for other services: None   Skilled Therapeutic Intervention  1st Session: Evaluation completed (see details above and below) with education on PT POC and goals and individual treatment initiated with focus on transfers, balance, ambulation, and stair training. Pt received seated on low couch next to wife. No complaint of pain. Pt perform sit to stand with modA HHA and cues for initiation and body mechanics. Stand step  transfer to [facility] with minA and cues for positioning and hand placement.  Pt performs car transfer with minA and RW, with cues for sequencing. Pt completes ramp navigation with RW and cues for posture and balance. Following seated  rest break, pt ambulates x150' with RW and CGA, with cues for increasing gait speed to decrease risk for falls. Pt completes x8 6" steps with bilateral hand rails and minA for stability. Seated rest break. Pt then ambulates x100' with SPC (which he used at baseline), with minA and same cueing. WC transport back to room. Stand step transfer to recliner with minA and cues for RW management and safe transition from standing to sitting. Left seated with alarm intact and all needs within reach.   2nd Session: Upon PT entry to the room, pt seen attempting to stand up out of bed with daughter sitting next to him. PT educates pt and family on importance of remaining in bed or seated unless staff is present. No complaint of pain. Pt ambulate to toilet with minA and sits to urinate. Stand step transfer to sink with minA and cues for positioning. WC transport to gym for time management. Stand step to Nustep with minA and cues for hand placement. Pt completes Nustep for strength and endurance training, as well as NMR of R/L hemibody. Pt completes total of 8:00 on workload of 5 with average steps per minute ~25 (PT cues to attempt ~40 steps per minute). Stand step transfer from Nustep>WC>recliner without AD, requiring minA. Left seated with alarm intact and all needs within reach.   Mobility Bed Mobility Bed Mobility: Supine to Sit;Sit to Supine Supine to Sit: Minimal Assistance - Patient > 75% Sit to Supine: Minimal Assistance - Patient > 75% Transfers Transfers: Sit to Stand;Stand to Sit;Stand Pivot Transfers Sit to Stand: Minimal Assistance - Patient > 75% Stand to Sit: Minimal Assistance - Patient > 75% Stand Pivot Transfers: Minimal Assistance - Patient > 75% Stand Pivot Transfer  Details: Verbal cues for technique;Verbal cues for safe use of DME/AE;Tactile cues for weight shifting Transfer (Assistive device): Rolling walker Locomotion  Gait Ambulation: Yes Gait Assistance: Contact Guard/Touching assist Gait Distance (Feet): 150 Feet Assistive device: Rolling walker Gait Gait: Yes Gait Pattern: Impaired Gait Pattern: Decreased stride length Stairs / Additional Locomotion Stairs: Yes Stairs Assistance: Minimal Assistance - Patient > 75% Stair Management Technique: Two rails Number of Stairs: 8 Height of Stairs: 6 Ramp: Minimal Assistance - Patient >75% Curb: Minimal Assistance - Patient >75% Wheelchair Mobility Wheelchair Mobility: No   Discharge Criteria: Patient will be discharged from PT if patient refuses treatment 3 consecutive times without medical reason, if treatment goals not met, if there is a change in medical status, if patient makes no progress towards goals or if patient is discharged from hospital.  The above assessment, treatment plan, treatment alternatives and goals were discussed and mutually agreed upon: by patient  Breck Coons, PT, DPT 10/28/2021, 4:27 PM

## 2021-10-28 NOTE — Discharge Instructions (Addendum)
Inpatient Rehab Discharge Instructions ? ?Yosgar Vivianne Master Sr. ?Discharge date and time: No discharge date for patient encounter.  ? ?Activities/Precautions/ Functional Status: ?Activity: activity as tolerated ?Diet: Diabetic diet ?Wound Care: Routine skin checks ?Functional status:  ?___ No restrictions     ___ Walk up steps independently ?___ 24/7 supervision/assistance   ___ Walk up steps with assistance ?___ Intermittent supervision/assistance  ___ Bathe/dress independently ?___ Walk with walker     __x_ Bathe/dress with assistance ?___ Walk Independently    ___ Shower independently ?___ Walk with assistance    ___ Shower with assistance ?___ No alcohol     ___ Return to work/school ________ ? ? ?COMMUNITY REFERRALS UPON DISCHARGE:   ? ?Home Health:   PT     OT     ST    SNA ?               Agency: [facility]  Phone: [phone] ?*Please expect follow-up within 2-3 days to schedule your home visit. If you have not received follow-up be sure to contact the branch directly.* ? ? ? ?Medical Equipment/Items Ordered:rolling walker ?                                                Agency/Supplier: [facility] [phone] ? ? ? ? ?Special Instructions: ? ?No driving smoking or alcohol ? ?Continue oxygen therapy as directed ? ?Continue to hold Plavix until further notice ? ?My questions have been answered and I understand these instructions. I will adhere to these goals and the provided educational materials after my discharge from the hospital. ? ?Patient/Caregiver Signature _______________________________ Date __________ ? ?Clinician Signature _______________________________________ Date __________ ? ?Please bring this form and your medication list with you to all your follow-up doctor's appointments.   ?

## 2021-10-28 NOTE — Progress Notes (Signed)
Inpatient Rehabilitation Care Coordinator ?Assessment and Plan ?Patient Details  ?Name: Jeff Coller Sr. ?MRN: [id number] ?Date of Birth: 1956-03-01 ? ?Today's Date: 10/28/2021 ? ?Hospital Problems: Principal Problem: ?  Brain tumor [facility]) ? ?Past Medical History:  ?Past Medical History:  ?Diagnosis Date  ? Morbid obesity ([place]) 10/17/2021  ? ?Past Surgical History:  ?Past Surgical History:  ?Procedure Laterality Date  ? APPLICATION OF CRANIAL NAVIGATION Left 10/20/2021  ? Procedure: APPLICATION OF CRANIAL NAVIGATION;  Surgeon: Earnie Larsson, MD;  Location: [place];  Service: Neurosurgery;  Laterality: Left;  ? CRANIOTOMY Left 10/20/2021  ? Procedure: Left temporal craniotomy for tumor with Brain Lab;  Surgeon: Earnie Larsson, MD;  Location: [place];  Service: Neurosurgery;  Laterality: Left;  ? RADIOLOGY WITH ANESTHESIA N/A 10/18/2021  ? Procedure: MRI WITH ANESTHESIA;  Surgeon: Radiologist, Medication, MD;  Location: [place];  Service: Radiology;  Laterality: N/A;  ? ?Social History:  reports that he has never smoked. He has never used smokeless tobacco. He reports that he does not currently use alcohol. He reports that he does not use drugs. ? ?Family / Support Systems ?Marital Status: Married ?How Long?: 40 years ?Patient Roles: Spouse, Parent ?Spouse/Significant Other: Jeff Wood (wife) ?Children: 6 adult children: Crystal Administrator, Civil Service and lives here in [place] a twin), [place] (twin/lives in [place]), Richard (oldest), Melissa (2nd child), and Amy (youngest/flying in next week). ?Other Supports: Crystal's husband ?Anticipated Caregiver: Wife, dtr [place] and her husband ?Ability/Limitations of Caregiver: Pt provides all transportation to parents. She also serves as a Theme park manager. No other concerns reported. ?Caregiver Availability: 24/7 ?Family Dynamics: Pt lives with his wife, and daughter's family. ? ?Social History ?Preferred language: English ?Religion:  ?Cultural Background: Pt worked in Passenger transport manager with [employer] ?Education: some college ?Health Literacy - How often do you need to have someone help you when you read instructions, pamphlets, or other written material from your doctor or pharmacy?: Never ?Writes: Yes ?Employment Status: Retired ?Date Retired/Disabled/Unemployed: 2012 ?Legal History/Current Legal Issues: Denies ?Guardian/Conservator: N/A  ? ?Abuse/Neglect ?Abuse/Neglect Assessment Can Be Completed: Unable to assess, patient is non-responsive or altered mental status (Pt not able to answer some questions for SW as easily distracted.) ?Physical Abuse: Denies ?Verbal Abuse: Denies ?Sexual Abuse: Denies ?Exploitation of patient/patient's resources: Denies ?Self-Neglect: Denies ? ?Patient response to: ?Social Isolation - How often do you feel lonely or isolated from those around you?: Never ? ?Emotional Status ?Pt's affect, behavior and adjustment status: Pt appears to be in good spirit. Pt is easily distracted, and hard to engage. ?Recent Psychosocial Issues: Denies ?Psychiatric History: Denies ?Substance Abuse History: Pt wife denies ? ?Patient / Family Perceptions, Expectations & Goals ?Pt/Family understanding of illness & functional limitations: Pt family has a general understanding of pt care needs ?Premorbid pt/family roles/activities: Independent ?Anticipated changes in roles/activities/participation: Assistance with ADLs/IADLs ?Pt/family expectations/goals: Family goal is for pt to get better to get where he can get home and work on word finding. ? ?[employer] ?Community Agencies: None ?Premorbid Home Care/DME Agencies: None ?Transportation available at discharge: [place] ?Is the patient able to respond to transportation needs?: Yes ?In the past 12 months, has lack of transportation kept you from medical appointments or from getting medications?: No ?In the past 12 months, has lack of transportation kept you from meetings, work, or from getting things needed for daily living?:  No ?Resource referrals recommended: Neuropsychology ? ?Discharge Planning ?Living Arrangements: Spouse/significant other, Children, Other relatives ?Support Systems: Spouse/significant other, Children, Other relatives ?Type of  Residence: Private residence ?Insurance Resources: [employer], Multimedia programmer (specify) ([employer] [name]) ?Financial Resources: Fish farm manager, Family Support ?Financial Screen Referred: No ?Living Expenses: Lives with family ?Money Management: Spouse ?Does the patient have any problems obtaining your medications?: No ?Home Management: Pt was managing bills but now wife manages. Dtr prepares all meals and house cleaning. ?Patient/Family Preliminary Plans: No changes ?Care Coordinator Anticipated Follow Up Needs: HH/OP ? ?Clinical Impression ?SW met with pt and pt wife and dtr Crystal in room to introduce self, explain role, and discuss discharge process. Pt is not a English as a second language teacher. HCPOA forms in chart. DME: shower chair and grab bard in bathroom.  ? ?Rana Snare ?10/28/2021, 2:58 PM ? ?  ?

## 2021-10-28 NOTE — Evaluation (Addendum)
Speech Language Pathology Assessment and Plan  Patient Details  Name: Jeff Liburd Sr. MRN: [id number] Date of Birth: [birth year]  SLP Diagnosis: Aphasia;Dysphagia;Cognitive Impairments  Rehab Potential: Good ELOS: 10-12 days   Today's Date: 10/28/2021 SLP Individual Time: 1345-1450 SLP Individual Time Calculation (min): 65 min  Hospital Problem: Principal Problem:   Brain tumor [facility])  Past Medical History:  Past Medical History:  Diagnosis Date   Morbid obesity ([place]) 10/17/2021   Past Surgical History:  Past Surgical History:  Procedure Laterality Date   APPLICATION OF CRANIAL NAVIGATION Left 10/20/2021   Procedure: APPLICATION OF CRANIAL NAVIGATION;  Surgeon: Earnie Larsson, MD;  Location: [name];  Service: Neurosurgery;  Laterality: Left;   CRANIOTOMY Left 10/20/2021   Procedure: Left temporal craniotomy for tumor with Brain Lab;  Surgeon: Earnie Larsson, MD;  Location: [place];  Service: Neurosurgery;  Laterality: Left;   RADIOLOGY WITH ANESTHESIA N/A 10/18/2021   Procedure: MRI WITH ANESTHESIA;  Surgeon: Radiologist, Medication, MD;  Location: [place];  Service: Radiology;  Laterality: N/A;    Assessment / Plan / Recommendation Clinical Impression Jeff Wood is a 66 year old right-handed male history of , hyperlipidemia, COPD with 2 L oxygen, CAD on Plavix, DVT on Eliquis, hypertension, type 2 diabetes mellitus.  Per chart review patient lives with spouse.  1 level home 2 steps to entry.  Modified independent prior to admission.  Wife assist with medication management.  Presented 10/15/2021 with acute onset of aphasia.  Cranial CT scan question 3-4 cm region of subacute infarct versus possibly mass lesion in the left posterior temporal lobe. Hyperemic mass in the left posterior temporal lobe measuring about 2.5 cm in size.  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  Chest x-ray without acute findings inflammatory markers were normal initially on precautions and discontinued.. Patient underwent left temporal craniotomy for tumor 10/20/2021 per Dr. Annette Stable. Tolerating mechanical soft diet. Therapy evaluations completed due to patient decreased functional mobility cognitive changes was admitted for a comprehensive rehab program.  Patient presents with expressive/receptive aphasia and cognitive impairments impacting attention, safety awareness, orientation, and insight into deficits. Further cognitive assessment will be limited at this time due to severity of language deficits. Expressive language deficits are characterized by significant word finding difficulty within natural conversation exchange which improved during structured language tasks. Patient exhibited frequent neologisms, phonemic, and literal paraphasias with inconsistent awareness and intermittent attempts to repair. Pt with reduced utterance length and sentence formation. He was able to repeat at the word level with 75% accuracy, and unable to the phrase and sentence level. Patient responded to basic yes/no questions with 80% accuracy and had increased difficulty understanding complex content. He could follow basic 1-step directions with 90% accuracy but significantly reduced accuracy with basic 2-step commands. Patient appeared frustrated by verbal errors and comprehension deficits.Patient requested to go to bathroom and demonstrated some impulsiveness and safety awareness concerns as evidenced by attempts to get out of bed without walker or assistance by SLP and was at times minimally receptive to SLP's suggestions to optimize safety. Family stated they have been taking patient to and from bathroom using RW. SLP provided education that it is protocol that family be trained and signed off by therapy before allowing them to assist with ambulation for patient's overall safety and  wellbeing. Spouse and daughter verbalized understanding.   SLP was unable to  complete full swallow assessment due to time constraints. Oral mechanism exam was unremarkable. Pt currently on dys 3 diet and thin liquids. Pt observed consuming thin liquids by straw without overt s/sx of aspiration. Family deny chewing/swallow difficulty and reported they have been bringing him hamburgers from cafeteria. SLP explained the need for SLP to further evaluate to ensure safety with PO intake prior to consideration of outside foods/regular textures. Family verbalized understanding.   Patient would benefit from skilled SLP intervention to maximize language, cognitive, and swallow function prior to discharge.   Skilled Therapeutic Interventions          Pt participated in portions of the Western Aphasia Battery bedside, Quick Aphasia Battery (QAB), and further non-standardized assessments of cognitive-linguistic, speech, and language function. Please see above.     SLP Assessment  Patient will need skilled Speech Lanaguage Pathology Services during CIR admission    Recommendations  SLP Diet Recommendations: Dysphagia 3 (Mech soft);Thin Liquid Administration via: Cup;Straw Medication Administration: Whole meds with puree Supervision: Staff to assist with self feeding;Intermittent supervision to cue for compensatory strategies Compensations: Slow rate;Small sips/bites;Minimize environmental distractions Postural Changes and/or Swallow Maneuvers: Seated upright 90 degrees Oral Care Recommendations: Oral care BID Patient destination: Home Follow up Recommendations: 24 hour supervision/assistance;Outpatient SLP;Home Health SLP Equipment Recommended: To be determined    SLP Frequency 3 to 5 out of 7 days   SLP Duration  SLP Intensity  SLP Treatment/Interventions 10-12 days  Minumum of 1-2 x/day, 30 to 90 minutes  Cognitive remediation/compensation;Internal/external aids;Environmental controls;Multimodal  communication approach;Speech/Language Firefighter;Dysphagia/aspiration precaution training;Functional tasks;Patient/family education;Therapeutic Activities    Pain  None  Prior Functioning Cognitive/Linguistic Baseline: Within functional limits Type of Home: House  Lives With: Spouse;Daughter Available Help at Discharge: Family;Available 24 hours/day  SLP Evaluation Cognition Overall Cognitive Status: Impaired/Different from baseline Arousal/Alertness: Awake/alert Orientation Level: Oriented to person Year: 2023 Month: January Day of Week: Incorrect Attention: Focused;Sustained Focused Attention: Impaired Focused Attention Impairment: Verbal basic Sustained Attention: Impaired Sustained Attention Impairment: Verbal basic Memory: Impaired Memory Impairment: Decreased recall of new information;Storage deficit Awareness: Impaired Awareness Impairment: Intellectual impairment Problem Solving: Impaired Problem Solving Impairment: Verbal basic;Functional basic Behaviors: Impulsive Safety/Judgment: Impaired Comments: Cues for safety  Comprehension Auditory Comprehension Overall Auditory Comprehension: Impaired Basic Immediate Environment Questions: 75-100% accurate Complex Questions: 25-49% accurate Commands: Impaired One Step Basic Commands: 75-100% accurate Two Step Basic Commands: 25-49% accurate Multistep Basic Commands: 0-24% accurate Complex Commands: 0-24% accurate Conversation: Simple Interfering Components: Attention;Processing speed EffectiveTechniques: Extra processing time;Repetition;Visual/Gestural cues Visual Recognition/Discrimination Discrimination: Not tested Reading Comprehension Reading Status: Not tested Expression Expression Primary Mode of Expression: Verbal Verbal Expression Overall Verbal Expression: Impaired Initiation: Impaired Automatic Speech: Name;Counting Level of Generative/Spontaneous Verbalization:  Sentence Repetition: Impaired Level of Impairment: Word level Naming: Impairment Responsive: 51-75% accurate Convergent: 0-24% accurate Verbal Errors: Neologisms;Semantic paraphasias;Phonemic paraphasias;Other (comment) (inconsistently aware of errors) Written Expression Dominant Hand: Right Written Expression: Not tested Oral Motor Oral Motor/Sensory Function Overall Oral Motor/Sensory Function: Within functional limits Motor Speech Overall Motor Speech: Appears within functional limits for tasks assessed Respiration: Within functional limits Phonation: Normal Resonance: Within functional limits Articulation: Within functional limitis Intelligibility: Intelligible Motor Planning: Witnin functional limits Motor Speech Errors: Not applicable  Care Tool Care Tool Cognition Ability to hear (with hearing aid or hearing appliances if normally used Ability to hear (with hearing aid or hearing appliances if normally used): 1. Minimal difficulty - difficulty in some environments (e.g. when person speaks softly or setting is noisy)   Expression of Ideas  and Wants Expression of Ideas and Wants: 2. Frequent difficulty - frequently exhibits difficulty with expressing needs and ideas   Understanding Verbal and Non-Verbal Content Understanding Verbal and Non-Verbal Content: 2. Sometimes understands - understands only basic conversations or simple, direct phrases. Frequently requires cues to understand  Memory/Recall Ability Memory/Recall Ability : That he or she is in a hospital/hospital unit   PMSV Assessment  PMSV Trial Intelligibility: Intelligible  Bedside Swallowing Assessment General Date of Onset: 10/20/21 Previous Swallow Assessment: bedside eval on 2/22 Diet Prior to this Study: Dysphagia 3 (soft);Thin liquids Temperature Spikes Noted: No Respiratory Status: Room air History of Recent Intubation: Yes Date extubated: 10/20/21 Behavior/Cognition: Alert;Cooperative Oral Cavity -  Dentition: Adequate natural dentition;Missing dentition Self-Feeding Abilities: Able to feed self Vision: Functional for self-feeding Patient Positioning: Upright in bed Baseline Vocal Quality: Normal  Oral Care Assessment Does patient have any of the following "high(er) risk" factors?: None of the above Does patient have any of the following "at risk" factors?: None of the above Patient is LOW RISK: Follow universal precautions (see row information) Ice Chips   Thin Liquid Thin Liquid: Not tested Nectar Thick Nectar Thick Liquid: Not tested Honey Thick Honey Thick Liquid: Not tested Puree Puree: Not tested Solid Solid: Not tested BSE Assessment Risk for Aspiration Impact on safety and function: Mild aspiration risk Other Related Risk Factors: Cognitive impairment  Short Term Goals: Week 1: SLP Short Term Goal 1 (Week 1): Patient will consume regular texture PO trials with effective mastication, oral clearance, and without overt s/sx of aspiration and sup A cues for swallow safety SLP Short Term Goal 2 (Week 1): Patient wil express wants/needs via multimodal communication with mod A verbal/visual cues SLP Short Term Goal 3 (Week 1): Patient will name/describe objects/pictures (e.g., object name, function, features) with 50% accuracy with mod-to-max A multimodal cues SLP Short Term Goal 4 (Week 1): Patient will respond to simple questions (wh questions, yes/no questions, open-ended questions) with mod A verbal/visual cues to increase communication in functional living environment SLP Short Term Goal 5 (Week 1): Patient will follow 1-2 step commands related to functional environment with 50% accuracy and mod A verbal/visual cues SLP Short Term Goal 6 (Week 1): Patient will sustain attention to functional tasks for 5-10 minute intervals with mod A verbal redirection  Refer to Care Plan for Long Term Goals  Recommendations for other services: None   Discharge Criteria: Patient will  be discharged from SLP if patient refuses treatment 3 consecutive times without medical reason, if treatment goals not met, if there is a change in medical status, if patient makes no progress towards goals or if patient is discharged from hospital.  The above assessment, treatment plan, treatment alternatives and goals were discussed and mutually agreed upon: by patient and by family  Patty Sermons 10/28/2021, 5:39 PM

## 2021-10-29 DIAGNOSIS — R7989 Other specified abnormal findings of blood chemistry: Secondary | ICD-10-CM | POA: Diagnosis not present

## 2021-10-29 DIAGNOSIS — D496 Neoplasm of unspecified behavior of brain: Secondary | ICD-10-CM | POA: Diagnosis not present

## 2021-10-29 DIAGNOSIS — G441 Vascular headache, not elsewhere classified: Secondary | ICD-10-CM | POA: Diagnosis not present

## 2021-10-29 DIAGNOSIS — E1169 Type 2 diabetes mellitus with other specified complication: Secondary | ICD-10-CM | POA: Diagnosis not present

## 2021-10-29 LAB — URINALYSIS, COMPLETE (UACMP) WITH MICROSCOPIC
Bilirubin Urine: NEGATIVE
Glucose, UA: 150 mg/dL — AB
Hgb urine dipstick: NEGATIVE
Ketones, ur: NEGATIVE mg/dL
Leukocytes,Ua: NEGATIVE
Nitrite: NEGATIVE
Protein, ur: NEGATIVE mg/dL
Specific Gravity, Urine: 1.027 (ref 1.005–1.030)
pH: 5 (ref 5.0–8.0)

## 2021-10-29 LAB — GLUCOSE, CAPILLARY
Glucose-Capillary: 147 mg/dL — ABNORMAL HIGH (ref 70–99)
Glucose-Capillary: 237 mg/dL — ABNORMAL HIGH (ref 70–99)
Glucose-Capillary: 216 mg/dL — ABNORMAL HIGH (ref 70–99)
Glucose-Capillary: 219 mg/dL — ABNORMAL HIGH (ref 70–99)

## 2021-10-29 MED ORDER — DEXAMETHASONE 2 MG PO TABS
2.0000 mg | ORAL_TABLET | Freq: Three times a day (TID) | ORAL | Status: DC
Start: 2021-10-29 — End: 2021-11-01
  Administered 2021-10-29 – 2021-11-01 (×9): 2 mg via ORAL
  Filled 2021-10-29 (×9): qty 1

## 2021-10-29 MED ORDER — QUETIAPINE FUMARATE 50 MG PO TABS
50.0000 mg | ORAL_TABLET | Freq: Every day | ORAL | Status: DC
  Administered 2021-10-29 – 2021-10-30 (×2): 50 mg via ORAL
  Filled 2021-10-29 (×2): qty 1

## 2021-10-29 NOTE — Progress Notes (Signed)
Speech Language Pathology Daily Session Note ? ?Patient Details  ?Name: Jeff Garry Sr. ?MRN: [id number] ?Date of Birth: 1956/01/06 ? ?Today's Date: 10/29/2021 ?SLP Individual Time: 9892-1194 ?SLP Individual Time Calculation (min): 45 min ? ?Short Term Goals: ?Week 1: SLP Short Term Goal 1 (Week 1): Patient will consume regular texture PO trials with effective mastication, oral clearance, and without overt s/sx of aspiration and sup A cues for swallow safety ?SLP Short Term Goal 2 (Week 1): Patient wil express wants/needs via multimodal communication with mod A verbal/visual cues ?SLP Short Term Goal 3 (Week 1): Patient will name/describe objects/pictures (e.g., object name, function, features) with 50% accuracy with mod-to-max A multimodal cues ?SLP Short Term Goal 4 (Week 1): Patient will respond to simple questions (wh questions, yes/no questions, open-ended questions) with mod A verbal/visual cues to increase communication in functional living environment ?SLP Short Term Goal 5 (Week 1): Patient will follow 1-2 step commands related to functional environment with 50% accuracy and mod A verbal/visual cues ?SLP Short Term Goal 6 (Week 1): Patient will sustain attention to functional tasks for 5-10 minute intervals with mod A verbal redirection ? ?Skilled Therapeutic Interventions: Skilled treatment session focused on dysphagia and cognitive-linguistic goals. Upon arrival, NT was assisting with shaving the patient's face. Patient requested to use the bathroom and was continent of bladder and required supervision level verbal cues for problem solving during functional tasks at the sink. Mod verbal cues were needed for safety with the RW throughout all tasks. Patient consumed a snack of regular textures and thin liquids. Patient with slow but efficient mastication with minimal oral residue that patient independently cleared with a liquid wash. No overt s/s of aspiration observed. Recommend patient upgrade to regular  textures despite family reporting that they have been providing regular texture snacks like strawberries since admission to CIR. Patient participated in a basic informal conversation with word substitutions and phonemic paraphasias noted. Patient requiring Mod-Max A multimodal cues to self-correct with intermittent awareness noted. Patient left upright in recliner with alarms on and all needs within reach. Continue with current plan of care.  ?   ? ?Pain ?No/Denies Pain  ? ?Therapy/Group: Individual Therapy ? ?Jeff Wood ?10/29/2021, 12:39 PM ?

## 2021-10-29 NOTE — Progress Notes (Signed)
Pt slept about 4 hours last night.  ?

## 2021-10-29 NOTE — Progress Notes (Signed)
Pam notified of U/A results ; no new orders noted ?

## 2021-10-29 NOTE — Progress Notes (Signed)
?                                                       PROGRESS NOTE ? ? ?Subjective/Complaints: ?Pt says that topamax helped headaches. Fell asleep and was doing well initially last night until he awoke when he had to urinate. Became confused and agitated per wife afterwards. Pt doing fairly well this morning, just finishing up PT when I found him ? ?ROS: Patient denies fever, rash, sore throat, blurred vision, dizziness, nausea, vomiting, diarrhea, cough, shortness of breath or chest pain, joint or back/neck pain.  ? ? ?Objective: ?  ?No results found. ?Recent Labs  ?  10/26/21 ?1000 10/27/21 ?1718  ?WBC 13.8* 14.5*  ?HGB 15.5 14.9  ?HCT 45.8 44.7  ?PLT 246 185  ? ?Recent Labs  ?  10/26/21 ?1000 10/27/21 ?1718  ?NA 136 136  ?K 3.7 3.7  ?CL 99 101  ?CO2 22 24  ?GLUCOSE 398* 222*  ?BUN 33* 29*  ?CREATININE 1.37* 1.04  ?CALCIUM 9.4 9.3  ? ? ?Intake/Output Summary (Last 24 hours) at 10/29/2021 0928 ?Last data filed at 10/28/2021 1803 ?Gross per 24 hour  ?Intake 460 ml  ?Output --  ?Net 460 ml  ?  ? ?  ? ?Physical Exam: ?Vital Signs ?Blood pressure (!) 142/102, pulse (!) 55, temperature 97.7 ?F (36.5 ?C), temperature source Oral, resp. rate 20, height 5\' 10"  (1.778 m), SpO2 97 %. ? ?Constitutional: No distress . Vital signs reviewed. ?HEENT: NCAT, EOMI, oral membranes moist ?Neck: supple ?Cardiovascular: RRR without murmur. No JVD    ?Respiratory/Chest: CTA Bilaterally without wheezes or rales. Normal effort    ?GI/Abdomen: BS +, non-tender, non-distended ?Ext: no clubbing, cyanosis, or edema ?Psych: pleasant and cooperative, engaging ?Skin: Clean dry. Crani incision cdi with staples. Two drain incisions over eyes with staples cdi also--areas stable ?Neuro:  Pt is alert and oriented x 3 with some delay. 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 Follows basic commands. Speech clearer. Normal language. No focal CN findings. 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 Moves all 4's  however 3-4/5. Decreased LT in stocking glove distribution in each lower leg and bilateral hands--sensory deficits unchanged. ?Musculoskeletal: no focal tenderness.   ? ? ?Assessment/Plan: ?1. Functional deficits which require 3+ hours per day of interdisciplinary therapy in a comprehensive inpatient rehab setting. ?Physiatrist is providing close team supervision and 24 hour management of active medical problems listed below. ?Physiatrist and rehab team continue to assess barriers to discharge/monitor patient progress toward functional and medical goals ? ?Care Tool: ? ?Bathing ?   ?Body parts bathed by patient: Right arm, Left arm, Chest, Abdomen, Front perineal area, Right upper leg, Left upper leg, Face  ? Body parts bathed by helper: Buttocks, Right lower leg, Left lower leg ?  ?  ?Bathing assist Assist Level: Moderate Assistance - Patient 50 - 74% ?  ?  ?Upper Body Dressing/Undressing ?Upper body dressing   ?What is the patient wearing?: Pull over shirt ?   ?Upper body assist Assist Level: Moderate Assistance - Patient 50 - 74% ?   ?Lower Body Dressing/Undressing ?Lower body dressing ? ? ?   ?What is the patient wearing?: Pants ? ?  ? ?Lower body assist Assist for lower body dressing: Maximal  Assistance - Patient 25 - 49% ?   ? ?Toileting ?Toileting    ?Toileting assist Assist for toileting: Minimal Assistance - Patient > 75% ?  ?  ?Transfers ?Chair/bed transfer ? ?Transfers assist ?   ? ?Chair/bed transfer assist level: Moderate Assistance - Patient 50 - 74% ?  ?  ?Locomotion ?Ambulation ? ? ?Ambulation assist ? ?   ? ?Assist level: Minimal Assistance - Patient > 75% ?Assistive device: Cane-straight ?Max distance: 74'  ? ?Walk 10 feet activity ? ? ?Assist ?   ? ?Assist level: Minimal Assistance - Patient > 75% ?Assistive device: Cane-straight  ? ?Walk 50 feet activity ? ? ?Assist   ? ?Assist level: Minimal Assistance - Patient > 75% ?Assistive device: Cane-straight  ? ? ?Walk 150 feet activity ? ? ?Assist  Walk 150 feet activity did not occur: Safety/medical concerns ? ?  ?  ?  ? ?Walk 10 feet on uneven surface  ?activity ? ? ?Assist   ? ? ?Assist level: Minimal Assistance - Patient > 75% ?   ? ?Wheelchair ? ? ? ? ?Assist Is the patient using a wheelchair?: No ?  ?  ? ?  ?   ? ? ?Wheelchair 50 feet with 2 turns activity ? ? ? ?Assist ? ?  ?  ? ? ?   ? ?Wheelchair 150 feet activity  ? ? ? ?Assist ?   ? ? ?   ? ?Blood pressure (!) 142/102, pulse (!) 55, temperature 97.7 ?F (36.5 ?C), temperature source Oral, resp. rate 20, height 5\' 10"  (1.778 m), SpO2 97 %. ? ?Medical Problem List and Plan: ?1. 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  Status post right craniotomy resection of tumor worrisome for glioblastoma 10/20/2021.  ?-DECADRON TAPER.  Medical oncology Dr. Mickeal Skinner to follow-up on plan of care ?-surgical path still pending. Discussed with pt/wife ?            -patient may shower ?            -ELOS/Goals: 10-12 days, supervision to min assist goals ? -Continue CIR therapies including PT, OT, and SLP   ?2.  Antithrombotics: ?-DVT/anticoagulation: Awaiting plan from neurosurgery when chronic Eliquis can be resumed. ?            -antiplatelet therapy: N/A ?3. Pain Management/headaches: Oxycodone as needed ? -continue topamax trial 25mg  qhs ?4. Mood/sleep: Provide emotional support ?            -antipsychotic agents: 3/2 increase seroquel to 50mg  qhs ?5. Neuropsych: This patient is capable of making decisions on his own behalf. ?6. Skin/Wound Care: Routine skin checks ?            -continue staples for a few more days ?7. Fluids/Electrolytes/Nutrition: encourage PO ? -prerenal azo ?  -pushing fluids ?  -recheck Friday ?8.  Seizure prophylaxis.  Keppra 1000 mg twice daily ?9.  History of CAD.  Plavix remains on hold.  No chest pain or shortness of breath.  Continue Imdur 30 mg daily and nitroglycerin as needed ?10.  Diabetes mellitus.  Hemoglobin A1c 9.6.  NovoLog 8 units 3 times daily, Semglee 28 units twice  daily. ? CBG (last 3)  ?Recent Labs  ?  10/28/21 ?1621 10/28/21 ?2133 10/29/21 ?0631  ?GLUCAP 248* 282* 147*  ?  3/2 remain elevated. Cover with SSI as we taper decadron ?11.  COPD.  Oxygen dependent 2 L prior to admission.  Continue nebulizers as directed ?12.  Hypertension.  Lopressor 100  mg twice daily ?  ? ?LOS: ?2 days ?A FACE TO FACE EVALUATION WAS PERFORMED ? ?Meredith Staggers ?10/29/2021, 9:28 AM  ? ?  ?

## 2021-10-29 NOTE — Care Management (Signed)
Inpatient Rehabilitation Center ?Individual Statement of Services ? ?Patient Name:  Jeff Gayden Sr.  ?Date:  10/29/2021 ? ?Welcome to the [facility].  Our goal is to provide you with an individualized program based on your diagnosis and situation, designed to meet your specific needs.  With this comprehensive rehabilitation program, you will be expected to participate in at least 3 hours of rehabilitation therapies Monday-Friday, with modified therapy programming on the weekends. ? ?Your rehabilitation program will include the following services:  Physical Therapy (PT), Occupational Therapy (OT), Speech Therapy (ST), 24 hour per day rehabilitation nursing, Therapeutic Recreaction (TR), Psychology, Neuropsychology, Care Coordinator, Rehabilitation Medicine, Nutrition Services, Pharmacy Services, and Other ? ?Weekly team conferences will be held on Tuesdays to discuss your progress.  Your Inpatient Rehabilitation Care Coordinator will talk with you frequently to get your input and to update you on team discussions.  Team conferences with you and your family in attendance may also be held. ? ?Expected length of stay: 10-12 days   ? ?Overall anticipated outcome: Minimal Assistance ? ?Depending on your progress and recovery, your program may change. Your Inpatient Rehabilitation Care Coordinator will coordinate services and will keep you informed of any changes. Your Inpatient Rehabilitation Care Coordinator's name and contact numbers are listed  below. ? ?The following services may also be recommended but are not provided by the [facility]:  ?Driving Evaluations ?Home Health Rehabiltiation Services ?Outpatient Rehabilitation Services ?Vocational Rehabilitation ?  ?Arrangements will be made to provide these services after discharge if needed.  Arrangements include referral to agencies that provide these services. ? ?Your insurance has been verified to be:  Medicare A/B ? ?Your  primary doctor is:  No PCP ? ?Pertinent information will be shared with your doctor and your insurance company. ? ?Inpatient Rehabilitation Care Coordinator:  Cathleen Corti [phone] or (C) [phone] ? ?Information discussed with and copy given to patient by: Rana Snare, 10/29/2021, 10:10 AM    ?

## 2021-10-29 NOTE — Progress Notes (Signed)
Physical Therapy Session Note ? ?Patient Details  ?Name: Jeff Leinbach Sr. ?MRN: [id number] ?Date of Birth: 05-Jan-1956 ? ?Today's Date: 10/29/2021 ?PT Individual Time: 4765-4650 and [phone] ?PT Individual Time Calculation (min): 70 min and 25 min ? ?Short Term Goals: ?Week 1:  PT Short Term Goal 1 (Week 1): Pt will complete bed moiblity consistently with CGA. ?PT Short Term Goal 2 (Week 1): Pt will complete bed to chair transfer consistently with CGA. ?PT Short Term Goal 3 (Week 1): Pt will ambulate x150' with CGA and LRAD. ?PT Short Term Goal 4 (Week 1): Pt will ascend/descend 2 steps with minA and LRAD. ? ?Skilled Therapeutic Interventions/Progress Updates:  ?   ?1st Session: Pt received seated in recliner with alarms intact. Agreeable to therapy. No complaint of pain. Sit to stand with minA and cues for anterior trunk lean. Stand step transfer to [facility] with cues for positioning. WC transport to gym for time management. ? ?Stand step transfer to mat table with cues for initiation and sequencing. Pt performs BERG balance test with several rest breaks. Pt scores 33/56 and has complete LOB with alternating toe taps, requiring modA to guide pt back to mat. PT re-educates pt on risk for falls and importance of calling for staff to assist with out of bed mobility. Pt verbalizes understanding.  ? ?PT educates pt on 5x Sit to Stand test and pt completes with time of 31.3 seconds, with use of bilateral upper extremities. PT then provides demonstration of sit to stand with arms folded across chest and pt is able to complete without physical assistance. Pt performs 5x Sit to Stand without use of arms with score of 25.5 seconds. ? ?Pt performs x5 alternating toe taps on 6 inch step with mirror for visual feedback and again has LOB to the R, requiring mindA/modA for stability. PT asks if pt can continue performing activity but pt politely requests seated rest break. Following break, pt able to complete x5 toe taps without LOB.   ? ?Pt ambulates x220' with RW and CGA, with cues to increase gait speed to decrease risk for falls. WC transport back to room. Stand step transfer back to recliner with minA. Left seated with alarm intact and all needs within reach. ? ?2nd Session: Pt received seated in recliner and agrees to therapy. No complaint of pain. Stand step transfer to [facility] without AD and with minA. WC transport to gym for time management. Pt ambulates x75' with SPC in L hand and CGA/minA, ascends/descends 4 steps with R hand rail and SPC in L hand, then ambulates additional 75'. Cues for upright gaze to improve posture and balance and increasing gait speed to decrease risk for falls. Following seated rest break, pt tasked with path finding challenge of ambulating back to room. Pt completes with only 1 cue required for direction, and reminder of room number. Pt navigates elevator with supervision. Pt left seated in WC with alarm intact and all needs within reach. ? ?Therapy Documentation ?Precautions:  ?Precautions ?Precautions: Fall ?Precaution Comments: aphasic ?Restrictions ?Weight Bearing Restrictions: No ? ? ?Therapy/Group: Individual Therapy ? ?Breck Coons, PT, DPT ?10/29/2021, 3:58 PM  ?

## 2021-10-30 ENCOUNTER — Encounter (HOSPITAL_COMMUNITY): Payer: Self-pay

## 2021-10-30 DIAGNOSIS — D496 Neoplasm of unspecified behavior of brain: Secondary | ICD-10-CM | POA: Diagnosis not present

## 2021-10-30 DIAGNOSIS — R4782 Fluency disorder in conditions classified elsewhere: Secondary | ICD-10-CM

## 2021-10-30 DIAGNOSIS — G441 Vascular headache, not elsewhere classified: Secondary | ICD-10-CM | POA: Diagnosis not present

## 2021-10-30 DIAGNOSIS — R7989 Other specified abnormal findings of blood chemistry: Secondary | ICD-10-CM | POA: Diagnosis not present

## 2021-10-30 DIAGNOSIS — E1169 Type 2 diabetes mellitus with other specified complication: Secondary | ICD-10-CM | POA: Diagnosis not present

## 2021-10-30 LAB — URINE CULTURE: Culture: <10000 — AB

## 2021-10-30 LAB — BASIC METABOLIC PANEL
Anion gap: 8 (ref 5–15)
BUN: 19 mg/dL (ref 8–23)
CO2: 25 mmol/L (ref 22–32)
Calcium: 8.9 mg/dL (ref 8.9–10.3)
Chloride: 104 mmol/L (ref 98–111)
Creatinine, Ser: 0.92 mg/dL (ref 0.61–1.24)
GFR, Estimated: >60 mL/min (ref >60)
Glucose, Bld: 94 mg/dL (ref 70–99)
Potassium: 3.7 mmol/L (ref 3.5–5.1)
Sodium: 137 mmol/L (ref 135–145)

## 2021-10-30 LAB — GLUCOSE, CAPILLARY
Glucose-Capillary: 97 mg/dL (ref 70–99)
Glucose-Capillary: 209 mg/dL — ABNORMAL HIGH (ref 70–99)
Glucose-Capillary: 168 mg/dL — ABNORMAL HIGH (ref 70–99)
Glucose-Capillary: 169 mg/dL — ABNORMAL HIGH (ref 70–99)

## 2021-10-30 LAB — SURGICAL PATHOLOGY

## 2021-10-30 MED ORDER — INSULIN GLARGINE-YFGN 100 UNIT/ML ~~LOC~~ SOLN
30.0000 [IU] | Freq: Two times a day (BID) | SUBCUTANEOUS | Status: DC
  Administered 2021-10-30 – 2021-11-06 (×14): 30 [IU] via SUBCUTANEOUS
  Filled 2021-10-30 (×16): qty 0.3

## 2021-10-30 NOTE — Progress Notes (Addendum)
?                                                       PROGRESS NOTE ? ? ?Subjective/Complaints: ?Slept better last night. Still voiding frequently. Headaches are improved ? ? ?ROS: Patient denies fever, rash, sore throat, blurred vision, dizziness, nausea, vomiting, diarrhea, cough, shortness of breath or chest pain, joint or back/neck pain,  or mood change.  ? ? ?Objective: ?  ?No results found. ?Recent Labs  ?  10/27/21 ?1718  ?WBC 14.5*  ?HGB 14.9  ?HCT 44.7  ?PLT 185  ? ?Recent Labs  ?  10/27/21 ?1718 10/30/21 ?0600  ?NA 136 137  ?K 3.7 3.7  ?CL 101 104  ?CO2 24 25  ?GLUCOSE 222* 94  ?BUN 29* 19  ?CREATININE 1.04 0.92  ?CALCIUM 9.3 8.9  ? ? ?Intake/Output Summary (Last 24 hours) at 10/30/2021 6213 ?Last data filed at 10/29/2021 1801 ?Gross per 24 hour  ?Intake 473 ml  ?Output --  ?Net 473 ml  ?  ? ?  ? ?Physical Exam: ?Vital Signs ?Blood pressure (!) 159/84, pulse (!) 52, temperature 97.6 ?F (36.4 ?C), temperature source Oral, resp. rate 19, height 5\' 10"  (1.778 m), SpO2 96 %. ? ?Constitutional: No distress . Vital signs reviewed. ?HEENT: NCAT, EOMI, oral membranes moist ?Neck: supple ?Cardiovascular: RRR without murmur. No JVD    ?Respiratory/Chest: CTA Bilaterally without wheezes or rales. Normal effort    ?GI/Abdomen: BS +, non-tender, non-distended ?Ext: no clubbing, cyanosis, or edema ?Psych: pleasant and cooperative  ?Skin: Clean dry. Crani incision cdi with staples. Two drain incisions over eyes as well.  ?Neuro:  very alert. Improving insight and awareness. Speech clear. Normal language. No focal CN findings. 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 Moves all 4's however 3+ to 4/5. Decreased LT in stocking glove distribution in each lower leg and bilateral hands-chronic LE sensory deficits ?Musculoskeletal: no focal tenderness.   ? ? ?Assessment/Plan: ?1. Functional deficits which require 3+ hours per day of interdisciplinary therapy in a comprehensive inpatient rehab setting. ?Physiatrist is providing close team  supervision and 24 hour management of active medical problems listed below. ?Physiatrist and rehab team continue to assess barriers to discharge/monitor patient progress toward functional and medical goals ? ?Care Tool: ? ?Bathing ?   ?Body parts bathed by patient: Right arm, Left arm, Chest, Abdomen, Front perineal area, Right upper leg, Left upper leg, Face  ? Body parts bathed by helper: Buttocks, Right lower leg, Left lower leg ?  ?  ?Bathing assist Assist Level: Moderate Assistance - Patient 50 - 74% ?  ?  ?Upper Body Dressing/Undressing ?Upper body dressing   ?What is the patient wearing?: Pull over shirt ?   ?Upper body assist Assist Level: Moderate Assistance - Patient 50 - 74% ?   ?Lower Body Dressing/Undressing ?Lower body dressing ? ? ?   ?What is the patient wearing?: Pants ? ?  ? ?Lower body assist Assist for lower body dressing: Maximal Assistance - Patient 25 - 49% ?   ? ?Toileting ?Toileting    ?Toileting assist Assist for toileting: Minimal Assistance - Patient > 75% ?  ?  ?Transfers ?Chair/bed transfer ? ?Transfers assist ?   ? ?Chair/bed transfer assist level: Moderate Assistance - Patient 50 - 74% ?  ?  ?Locomotion ?Ambulation ? ? ?Ambulation assist ? ?   ? ?  Assist level: Minimal Assistance - Patient > 75% ?Assistive device: Cane-straight ?Max distance: 4'  ? ?Walk 10 feet activity ? ? ?Assist ?   ? ?Assist level: Minimal Assistance - Patient > 75% ?Assistive device: Cane-straight  ? ?Walk 50 feet activity ? ? ?Assist   ? ?Assist level: Minimal Assistance - Patient > 75% ?Assistive device: Cane-straight  ? ? ?Walk 150 feet activity ? ? ?Assist Walk 150 feet activity did not occur: Safety/medical concerns ? ?  ?  ?  ? ?Walk 10 feet on uneven surface  ?activity ? ? ?Assist   ? ? ?Assist level: Minimal Assistance - Patient > 75% ?   ? ?Wheelchair ? ? ? ? ?Assist Is the patient using a wheelchair?: No ?  ?  ? ?  ?   ? ? ?Wheelchair 50 feet with 2 turns activity ? ? ? ?Assist ? ?  ?  ? ? ?    ? ?Wheelchair 150 feet activity  ? ? ? ?Assist ?   ? ? ?   ? ?Blood pressure (!) 159/84, pulse (!) 52, temperature 97.6 ?F (36.4 ?C), temperature source Oral, resp. rate 19, height 5\' 10"  (1.778 m), SpO2 96 %. ? ?Medical Problem List and Plan: ?1. 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  Status post right craniotomy resection of tumor worrisome for glioblastoma 10/20/2021.  ?-DECADRON TAPER.  Medical oncology Dr. Mickeal Skinner to follow-up on plan of care ?-surgical path still pending. Will reach out to pathology today ?            -patient may shower ?            -ELOS/Goals: 10-12 days, supervision to min assist goals ? -Continue CIR therapies including PT, OT, and SLP   ?2.  Antithrombotics: ?-DVT/anticoagulation: Awaiting plan from neurosurgery when chronic Eliquis can be resumed. ?            -antiplatelet therapy: N/A ?3. Pain Management/headaches: Oxycodone as needed ? -continue topamax trial 25mg  qhs ?4. Mood/sleep:   ?            -antipsychotic agents: 3/2 increased seroquel to 50mg  qhs with good results  ?5. Neuropsych: This patient is capable of making decisions on his own behalf. ?6. Skin/Wound Care: Routine skin checks ?            -dc staples today 3/3 ?7. Fluids/Electrolytes/Nutrition: encourage PO ? -3/3 prerenal azo ?  -pushing fluids ?  -BMET wnl today ?8.  Seizure prophylaxis.  Keppra 1000 mg twice daily ?9.  History of CAD.  Plavix remains on hold.  No chest pain or shortness of breath.  Continue Imdur 30 mg daily and nitroglycerin as needed ?10.  Diabetes mellitus.  Hemoglobin A1c 9.6.  NovoLog 8 units 3 times daily, Semglee 28 units twice daily. ? CBG (last 3)  ?Recent Labs  ?  10/29/21 ?1648 10/29/21 ?2059 10/30/21 ?0177  ?GLUCAP 216* 219* 97  ?  3/3 remain elevated--will bump semglee to 30u bid ?-Cover with SSI as we taper decadron ?11.  COPD.  Oxygen dependent 2 L prior to admission.  Continue nebulizers as directed ?12.  Hypertension.  Lopressor 100 mg twice daily ?  3/3 borderline to  controlled ?13. Urinary frequency: ? -UA/UCX negative ? -check PVR to make sure he's emptying completely ? -consider anticholinergic for bladder ? ? ? ?LOS: ?3 days ?A FACE TO FACE EVALUATION WAS PERFORMED ? ?Meredith Staggers ?10/30/2021, 9:22 AM  ? ?  ?

## 2021-10-30 NOTE — Progress Notes (Signed)
Physical Therapy Session Note ? ?Patient Details  ?Name: Jeff Stankus Sr. ?MRN: [id number] ?Date of Birth: 23-Jul-1956 ? ?Today's Date: 10/30/2021 ?PT Individual Time: 4619-0122 ?PT Individual Time Calculation (min): 69 min  ? ?Short Term Goals: ?Week 1:  PT Short Term Goal 1 (Week 1): Pt will complete bed moiblity consistently with CGA. ?PT Short Term Goal 2 (Week 1): Pt will complete bed to chair transfer consistently with CGA. ?PT Short Term Goal 3 (Week 1): Pt will ambulate x150' with CGA and LRAD. ?PT Short Term Goal 4 (Week 1): Pt will ascend/descend 2 steps with minA and LRAD. ?Week 2:    ?Week 3:    ? ?Skilled Therapeutic Interventions/Progress Updates:  ? ?Pt received sitting in recliner and agreeable to PT. Stand pivot transfer to [facility] with min assist and RW.  ? ?Pt transported to rehab gym in [facility]. Sit<>stand from [facility] with BUE to push from [facility] arm rests with supervision assist throughout remainder of PT treatment.  ? ?Gait training with RW x 157f with CGA-supervision assist from PT with cues for AD management in turns.  ? ?BITS:  ?Visual scanning user paced    ?Visual scanning Sequencing 1-20 and A-Z required min-mod cues for retention of sequence from J-N, able to self regulate with less options for later 10 letterd ?Trailmaking:  A; 1.56 min, 3 errors, 4 interruptions.  ?Trailmaking:  B; 2:131m 6 errors, 5 interruptions. Max cues for retention of number/letter sequence.  ? ?Dynamic balance. Ball raise from hips to shoulder height. Lateral taps on RW handle 10 x 3 , toss to self 2 x 10. CGA from PT for safety with min cues for posture intermittently.  ? ?Patient left sitting in WC in Day room all needs met for OT treatment.   ? ? ?    ? ?   ? ?Therapy Documentation ?Precautions:  ?Precautions ?Precautions: Fall ?Precaution Comments: aphasic ?Restrictions ?Weight Bearing Restrictions: No ? ?Vital Signs: ?Therapy Vitals ?Temp: 98.4 ?F (36.9 ?C) ?Temp Source: Oral ?Pulse Rate: (!) 55 ?Resp: 18 ?BP: (!)  142/76 ?Patient Position (if appropriate): Sitting ?Oxygen Therapy ?SpO2: 99 % ?O2 Device: Room Air ?Pain: ?Pain Assessment ?Pain Scale: 0-10 ?Pain Score: 8  ?Pain Type: Surgical pain ?Pain Location: Head ?Pain Orientation: Left;Proximal ?Pain Intervention(s): Medication (See eMAR) ? ? ? ? ?Therapy/Group: Individual Therapy ? ?AuLorie Phenix3/10/2021, 2:18 PM  ?

## 2021-10-30 NOTE — Progress Notes (Addendum)
Occupational Therapy Session Note ? ?Patient Details  ?Name: Ellwyn Ergle Sr. ?MRN: [id number] ?Date of Birth: 15-Apr-1956 ? ?Session 1: ?Today's Date: 10/30/2021 ?OT Individual Time: 4098-1191 ?OT Individual Time Calculation (min): 53 min  ? ?Session 2: ? ?Today's Date: 10/30/2021 ?OT Individual Time: 1415-1440 ?OT Individual Time Calculation (min): 25 min  ?20 min missed d/t fatigue/refusal  ? ? ?Short Term Goals: ?Week 1:  OT Short Term Goal 1 (Week 1): Patient will don overhead clothing with Min A. ?OT Short Term Goal 2 (Week 1): Patient will don LB clothing with Min A. ?OT Short Term Goal 3 (Week 1): Patient will complete 3/3 grooming tasks in standing with supervision A and no more than 2 verbal cues for sequencing. ?OT Short Term Goal 4 (Week 1): Patient will complete toilet transfers with CGA and LRAD. ? ?Skilled Therapeutic Interventions/Progress Updates:  ?  Session 1 ?Pt received supine with no c/o pain, wife and daughter present and involved in session. Pt agreeable to session focused on ADLs at shower level. His daughter Crystal completed his shower transfer with min A. Cueing/education provided for pt and caregiver safety. He required cueing for sequencing fall risk reduction strategies. He completed UB bathing with (S) overall. Min A for LB bathing- reaching distally to his feet. He required cueing for sequencing to properly use the TTB- recommended this is what he and his wife use at home in their tub shower as well. Dtr Crystal ordered TTB in the session. Pt used the RW to transfer to the EOB with CGA overall. He donned a shirt with mod A d/t shoulder ROM deficits. Mod A to don pants as well d/t fatigue. He initially stayed in the recliner and then transferred back to the bed. He was left supine with all needs met, bed alarm set. Signed off daughter Crystal to transfer pt to the bathroom, not his wife at this time- provided edu on reasoning.  ? ? ?Session 2 ?Pt received from PT in the dayroom. He  initially declined any participation in OT stating he was too tired but he was able to be convinced to participate in gentle ROM and endurance training on the NuStep. He was able to tolerate 5 min before declining any further participation. He transferred back to the w/c with CGA using the RW, min cueing for sequencing. Pt was taken to the tub room where demo was provided re TTB- he agreed this was the safest option for him. He returned to his room and transferred back to the recliner with CGA. He was left sitting up with all needs met, chair alarm set.  ? ?Therapy Documentation ?Precautions:  ?Precautions ?Precautions: Fall ?Precaution Comments: aphasic ?Restrictions ?Weight Bearing Restrictions: No ? ?Therapy/Group: Individual Therapy ? ?Curtis Sites ?10/30/2021, 6:39 AM ?

## 2021-10-30 NOTE — Consult Note (Signed)
Neuropsychological Consultation   Patient:   Jeff Hagin Sr.   DOB:   09/13/1955  MR Number:  [id number]  Location:  [facility] [id number] [place] [address] Dept: [place]: [phone]           Date of Service:   10/30/2021  Start Time:   9 AM End Time:   10 AM  Provider/Observer:  Ilean Skill, Psy.D.       Clinical Neuropsychologist       Billing Code/Service: 28315  Chief Complaint:    Jeff Delellis. is a 66 year old male with past history of hyperlipidemia, COPD on 2 L oxygen, CAD, DVT, hypertensive, type 2 diabetes.  Patient presented on 10/15/2021 with acute onset of aphasia including paraphasic errors and fluency deficits.  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  Patient underwent left temporal craniotomy for tumor by Dr. Trenton Gammon.  Pathology report is still pending and the patient will be presented to the tumor board from medical oncology for follow-up.  Awaiting plan.  y  Reason for Service:  Patient was referred for neuropsychological consult due to ongoing expressive language changes, impulse issues and coping.  Below is the HPI for the current admission.  HPI: Jeff Dowell. Jeff Wood is a 66 year old right-handed male history of , hyperlipidemia, COPD with 2 L oxygen, CAD on Plavix, DVT on Eliquis, hypertension, type 2 diabetes mellitus.  Per chart review patient lives with spouse.  1 level home 2 steps to entry.  Modified independent prior to admission.  Wife assist with medication management.  Presented 10/15/2021 with acute onset of aphasia.  Cranial CT scan question 3-4 cm region of subacute infarct versus possibly mass lesion in the left posterior temporal lobe.  CT angiogram head and neck no large vessel occlusion.  Hyperemic mass  in the left posterior temporal lobe measuring about 2.5 cm in size.  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  Admission chemistries unremarkable aside glucose 230, urine drug screen negative, hemoglobin A1c 9.5, lab test positive detection of COVID-19 virus per wife received initial 2 doses of COVID-vaccine and 1 booster shot.  Chest x-ray without acute findings inflammatory markers were normal initially on precautions and discontinued..  Patient underwent left temporal craniotomy for tumor 10/20/2021 per Dr. Annette Stable.  Maintained on Decadron with slow taper.  EEG negative for seizure and maintained on Keppra for seizure prophylaxis..  Follow-up cranial CT scan 10/26/2021 showed stable resection cavity with small volume adjacent parenchymal blood unchanged mild regional mass effect no new intracranial abnormality..  Pathology report pending to be presented to tumor board and Dr. Mickeal Skinner from medical oncology to follow-up.  Awaiting plan from neurosurgery on when to resume chronic Eliquis..  Tolerating mechanical soft diet.  Therapy evaluations completed due to patient decreased functional mobility cognitive changes was admitted for a comprehensive rehab program.  Current Status:  Patient was awake and sitting up on the edge of his bed as I entered the room.  Patient was somewhat drowsy with some continued articulation issues with impaired cognition.  At some point during our conversation he impulsively stood up to go over to sit in the chair without warning.  I was able to get to him rather quickly to assist to ensure that he did not fall.  Patient's wife was present and reported that this is pretty common for him and even though he has been repeatedly told not to get up without assistance he still is impulsively doing this.  Patient displayed slowed verbal fluency and some  word finding issues as well as slowed information processing speed and executive functioning deficits.  Questioning the patient's wife she reports that prior to his acute onset of aphasia that he had began to have some personality changes including increased agitation and increased impulsive behaviors to some degree but not severe.  I suspect that the growing tumor was having either a mechanical force on other brain regions including left frontal region or it was beginning to have an effect on blood flow to frontal regions that acutely brought on the aphasic symptoms that led them to call EMS and may have been playing a role in changes in behavior and personality styles for period of weeks before his hospital admission.  During the visit, the patient's wife reported that he has not been told all of the details or suspicion about what type of brain tumor he has or concerns from Dr. Trenton Gammon.  They are aware that we are still waiting pathology and tumor team assessment.  Patient is aware he had a brain tumor but that the limits of what he knows so far.  Behavioral Observation: Jeff Chavarria Sr.  presents as a 66 y.o.-year-old Right handed Caucasian Male who appeared his stated age. his dress was Appropriate and he was Well Groomed and his manners were Appropriate to the situation.  his participation was indicative of Drowsy and Inattentive behaviors.  There were physical disabilities noted.  he displayed an appropriate level of cooperation and motivation.     Interactions:    Active Inattentive and Redirectable  Attention:   abnormal and attention span appeared shorter than expected for age  Memory:   abnormal; remote memory intact, recent memory impaired  Visuo-spatial:  not examined  Speech (Volume):  low  Speech:   slurred; non-fluent aphasia  Thought Process:  Circumstantial and Tangential  Though Content:  WNL; not suicidal and not homicidal  Orientation:   person and  place  Judgment:   Poor  Planning:   Poor  Affect:    Blunted  Mood:    Dysphoric  Insight:   Shallow  Intelligence:   normal  Medical History:   Past Medical History:  Diagnosis Date   Morbid obesity ([place]) 10/17/2021         Patient Active Problem List   Diagnosis Date Noted   Fluency disorder associated with underlying disease    Brain tumor ([place]) 10/27/2021   Paroxysmal atrial fibrillation with RVR ([place]) 10/24/2021   Agitation 10/20/2021   Mass of left temporal lobe 10/19/2021   COPD with chronic hypoxic respiratory failure 10/18/2021   Hypokalemia 10/18/2021   Morbid obesity ([place]) 10/17/2021   Hyperlipidemia 10/17/2021   CAD S/P percutaneous coronary angioplasty 10/16/2021   History of DVT (deep vein thrombosis) 10/16/2021   Essential hypertension 10/16/2021   Diabetes mellitus type 2, noninsulin dependent ([place]) 10/16/2021   Lab test positive for detection of COVID-19 virus 10/16/2021   Sinus bradycardia 10/16/2021   Acute toxic metabolic encephalopathy with aphasia with new left temporal mass 10/16/2021     Psychiatric History:  No prior psychiatric history.  Family Med/Psych History: History reviewed. No pertinent family history.  Impression/DX:  Jeff Torok Sr. is a 66 year old male with past history of hyperlipidemia, COPD on 2 L oxygen, CAD, DVT, hypertensive, type 2 diabetes.  Patient presented on 10/15/2021 with acute onset of aphasia including paraphasic errors and fluency deficits.  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  Patient underwent left temporal craniotomy for tumor by Dr. Trenton Gammon.  Pathology report is still pending and the patient will be presented to the tumor board from medical oncology for follow-up.  Awaiting plan.  Patient was awake and sitting up on the edge of his bed as I entered the room.  Patient  was somewhat drowsy with some continued articulation issues with impaired cognition.  At some point during our conversation he impulsively stood up to go over to sit in the chair without warning.  I was able to get to him rather quickly to assist to ensure that he did not fall.  Patient's wife was present and reported that this is pretty common for him and even though he has been repeatedly told not to get up without assistance he still is impulsively doing this.  Patient displayed slowed verbal fluency and some word finding issues as well as slowed information processing speed and executive functioning deficits.  Questioning the patient's wife she reports that prior to his acute onset of aphasia that he had began to have some personality changes including increased agitation and increased impulsive behaviors to some degree but not severe.  I suspect that the growing tumor was having either a mechanical force on other brain regions including left frontal region or it was beginning to have an effect on blood flow to frontal regions that acutely brought on the aphasic symptoms that led them to call EMS and may have been playing a role in changes in behavior and personality styles for period of weeks before his hospital admission.  During the visit, the patient's wife reported that he has not been told all of the details or suspicion about what type of brain tumor he has or concerns from Dr. Trenton Gammon.  They are aware that we are still waiting pathology and tumor team assessment.  Patient is aware he had a brain tumor but that the limits of what he knows so far.  Disposition/Plan:  I suspect that the patient is ongoing speech and language changes and acute onset of aphasia prior to calling EMS or due to either mechanical changes and pressure but more likely changes in blood flow.  Patient's wife reports some changes in personality and behavioral style developing at least a couple of weeks before his acute  presentation.         Electronically Signed   _______________________ Ilean Skill, Psy.D. Clinical Neuropsychologist

## 2021-10-30 NOTE — IPOC Note (Signed)
Overall Plan of Care (IPOC) ?Patient Details ?Name: Jeff Ballentine Sr. ?MRN: [id number] ?DOB: November 01, 1955 ? ?Admitting Diagnosis: Brain tumor ([place]) ? ?Hospital Problems: Principal Problem: ?  Brain tumor [facility]) ?Active Problems: ?  Fluency disorder associated with underlying disease ? ? ? ? Functional Problem List: ?Nursing Endurance, Motor, Pain, Safety, Skin Integrity  ?PT Balance, Endurance, Behavior, Motor, Pain, Perception, Safety, Sensory  ?OT Balance, Cognition, Endurance, Motor, Pain, Perception, Safety  ?SLP Cognition, Linguistic, Safety  ?TR    ?    ? Basic ADL?s: ?OT Grooming, Bathing, Dressing, Toileting  ? ?  Advanced  ADL?s: ?OT    ?   ?Transfers: ?PT Bed Mobility, Bed to Chair, Car, Furniture  ?OT Toilet, Tub/Shower  ? ?  Locomotion: ?PT Ambulation, Stairs  ? ?  Additional Impairments: ?OT    ?SLP Swallowing, Communication, Social Cognition ?expression, comprehension ?Memory, Attention, Awareness  ?TR    ? ? ?Anticipated Outcomes ?Item Anticipated Outcome  ?Self Feeding N/A  ?Swallowing ? mod I-to-sup A ?  ?Basic self-care ? S  ?Toileting ? S ?  ?Bathroom Transfers S  ?Bowel/Bladder ? n/a  ?Transfers ? Supervision  ?Locomotion ? Supervision  ?Communication ? min A  ?Cognition ? min A  ?Pain ? < 3  ?Safety/Judgment ? min assist and no falls  ? ?Therapy Plan: ?PT Intensity: Minimum of 1-2 x/day ,45 to 90 minutes ?PT Frequency: 5 out of 7 days ?PT Duration Estimated Length of Stay: 10-12 days ?OT Intensity: Minimum of 1-2 x/day, 45 to 90 minutes ?OT Frequency: 5 out of 7 days ?OT Duration/Estimated Length of Stay: 10-12 days ?SLP Intensity: Minumum of 1-2 x/day, 30 to 90 minutes ?SLP Frequency: 3 to 5 out of 7 days ?SLP Duration/Estimated Length of Stay: 10-12 days  ? ?Due to the current state of emergency, patients may not be receiving their 3-hours of Medicare-mandated therapy. ? ? Team Interventions: ?Nursing Interventions Patient/Family Education, Disease Management/Prevention, Pain Management,  Medication Management, Skin Care/Wound Management, Discharge Planning, Psychosocial Support  ?PT interventions Ambulation/gait training, Community reintegration, DME/adaptive equipment instruction, Neuromuscular re-education, Psychosocial support, Stair training, UE/LE Strength taining/ROM, Training and development officer, Discharge planning, Functional electrical stimulation, Pain management, Skin care/wound management, Therapeutic Activities, UE/LE Coordination activities, Cognitive remediation/compensation, Disease management/prevention, Functional mobility training, Patient/family education, Therapeutic Exercise, Visual/perceptual remediation/compensation  ?OT Interventions Balance/vestibular training, Cognitive remediation/compensation, Community reintegration, Discharge planning, DME/adaptive equipment instruction, Functional mobility training, Pain management, Patient/family education, Self Care/advanced ADL retraining, Therapeutic Activities, Therapeutic Exercise, UE/LE Strength taining/ROM, UE/LE Coordination activities  ?SLP Interventions Cognitive remediation/compensation, Internal/external aids, Environmental controls, Multimodal communication approach, Speech/Language facilitation, Cueing hierarchy, Dysphagia/aspiration precaution training, Functional tasks, Patient/family education, Therapeutic Activities  ?TR Interventions    ?SW/CM Interventions Discharge Planning, Psychosocial Support, Patient/Family Education  ? ?Barriers to Discharge ?MD  Medical stability  ?Nursing Home environment access/layout, Decreased caregiver support, Wound Care, Lack of/limited family support, Weight, New oxygen ?1 level, 2 steps, no rails. Wife and daughter to be caregivers. Wife may have mild dementia. Daughter very hands on.  ?PT   ?   ?OT Inaccessible home environment ?   ?SLP   ?   ?SW   ?   ? ?Team Discharge Planning: ?Destination: PT-Home ,OT- Home , SLP-Home ?Projected Follow-up: PT-24 hour supervision/assistance,  Outpatient PT, OT-  Outpatient OT, 24 hour supervision/assistance, SLP-24 hour supervision/assistance, Outpatient SLP, Home Health SLP ?Projected Equipment Needs: PT-To be determined, OT- To be determined, SLP-To be determined ?Equipment Details: PT- , OT-  ?Patient/family involved in discharge planning: PT- Family member/caregiver,  OT-Patient, SLP-Patient, Family member/caregiver ? ?MD ELOS: 10-12 days ?Medical Rehab Prognosis:  Excellent ?Assessment: The patient has been admitted for CIR therapies with the diagnosis of brain tumor suspicious for GBM, s/p craniotomy. The team will be addressing functional mobility, strength, stamina, balance, safety, adaptive techniques and equipment, self-care, bowel and bladder mgt, patient and caregiver education, NMR, pain, cognition, communication, community reentry. Goals have been set at supervision for self-care and mobility and sup/min assist for cognition. Anticipated discharge destination is home with family. ? ?Due to the current state of emergency, patients may not be receiving their 3 hours per day of Medicare-mandated therapy.  ? ? ?Meredith Staggers, MD, FAAPMR   ? ? ?See Team Conference Notes for weekly updates to the plan of care  ?

## 2021-10-30 NOTE — Progress Notes (Signed)
Came to room for breathing tx.  Pt unavailable at this time.   ?

## 2021-10-30 NOTE — Progress Notes (Addendum)
Speech Language Pathology Daily Session Note ? ?Patient Details  ?Name: Jeff Urton Sr. ?MRN: [id number] ?Date of Birth: [birth year] ? ?Today's Date: 10/30/2021 ?SLP Individual Time: 6754-4920 ?SLP Individual Time Calculation (min): 45 min ? ?Short Term Goals: ?Week 1: SLP Short Term Goal 1 (Week 1): Patient will consume regular texture PO trials with effective mastication, oral clearance, and without overt s/sx of aspiration and sup A cues for swallow safety ?SLP Short Term Goal 2 (Week 1): Patient wil express wants/needs via multimodal communication with mod A verbal/visual cues ?SLP Short Term Goal 3 (Week 1): Patient will name/describe objects/pictures (e.g., object name, function, features) with 50% accuracy with mod-to-max A multimodal cues ?SLP Short Term Goal 4 (Week 1): Patient will respond to simple questions (wh questions, yes/no questions, open-ended questions) with mod A verbal/visual cues to increase communication in functional living environment ?SLP Short Term Goal 5 (Week 1): Patient will follow 1-2 step commands related to functional environment with 50% accuracy and mod A verbal/visual cues ?SLP Short Term Goal 6 (Week 1): Patient will sustain attention to functional tasks for 5-10 minute intervals with mod A verbal redirection ? ?Skilled Therapeutic Interventions: Skilled ST treatment focused on communication goals. Patient taken in [facility] to quiet/private therapy environment where pt sustained attention to tasks during duration of session with minimal external or internal distractibility observed. SLP facilitated session by providing mod A verbal and written cues to initiate automatic speech tasks including days of the week and months. Once initiated, pt verbalized with 100% accuracy without support. Pt required min A verbal cues to initiate counting 1-20. SLP facilitated object-picture naming with 66% accuracy when given sup-to-min A cues, progressing to 75% accuracy with mod A phonemic, cloze  phrase, and first letter written cues. Patient exhibited semantic and phonemic paraphasias with inconsistent awareness. Pt appeared more consistently aware of verbal perseverations. He was receptive to verbal cues in effort to repair verbal errors. Pt completed generative naming task by naming 3 items in common categories (i.e., fruit, vegetables, drinks) with mod A. Patient wrote response x1 with 100% spelling accuracy. Unable to attempt further due to time constraints. Patient was taken back to room and transferred to recliner chair with CGA. SLP educated spouse on session and beneficial communication strategies. Patient was left in recliner with alarm activated and immediate needs within reach at end of session. Continue per current plan of care.   ?   ?Pain ?Pain Assessment ?Pain Score: 0-No pain ? ?Therapy/Group: Individual Therapy ? ?Jeff Wood ?10/30/2021, 12:17 PM ?

## 2021-10-31 LAB — GLUCOSE, CAPILLARY
Glucose-Capillary: 160 mg/dL — ABNORMAL HIGH (ref 70–99)
Glucose-Capillary: 191 mg/dL — ABNORMAL HIGH (ref 70–99)
Glucose-Capillary: 267 mg/dL — ABNORMAL HIGH (ref 70–99)
Glucose-Capillary: 193 mg/dL — ABNORMAL HIGH (ref 70–99)

## 2021-10-31 MED ORDER — TRAZODONE HCL 50 MG PO TABS
50.0000 mg | ORAL_TABLET | Freq: Once | ORAL | Status: AC
  Administered 2021-10-31: 50 mg via ORAL
  Filled 2021-10-31: qty 1

## 2021-10-31 MED ORDER — QUETIAPINE FUMARATE 50 MG PO TABS
75.0000 mg | ORAL_TABLET | Freq: Every day | ORAL | Status: DC
  Administered 2021-10-31: 75 mg via ORAL
  Filled 2021-10-31: qty 1

## 2021-10-31 MED ORDER — FESOTERODINE FUMARATE ER 4 MG PO TB24
4.0000 mg | ORAL_TABLET | Freq: Every day | ORAL | Status: DC
  Administered 2021-10-31 – 2021-11-06 (×7): 4 mg via ORAL
  Filled 2021-10-31 (×8): qty 1

## 2021-10-31 MED ORDER — TRAZODONE HCL 50 MG PO TABS
50.0000 mg | ORAL_TABLET | Freq: Every evening | ORAL | Status: AC | PRN
  Administered 2021-10-31: 50 mg via ORAL

## 2021-10-31 MED ORDER — TRAZODONE HCL 50 MG PO TABS
50.0000 mg | ORAL_TABLET | Freq: Every evening | ORAL | Status: DC | PRN
  Filled 2021-10-31: qty 1

## 2021-10-31 NOTE — Progress Notes (Signed)
Main Pharmacy contacted via phone for [place]. Message sent via [facility] this am.  ?

## 2021-10-31 NOTE — Progress Notes (Signed)
?                                                       PROGRESS NOTE ? ? ?Subjective/Complaints: ?Had a restless night. Up and down all night. Had to urinate. PVR's low. Confused. Still disoriented and sleepy when I saw him around 645 this morning.  ? ?ROS: Limited due to cognitive/behavioral  ? ? ?Objective: ?  ?No results found. ?No results for input(s): WBC, HGB, HCT, PLT in the last 72 hours. ? ?Recent Labs  ?  10/30/21 ?0600  ?NA 137  ?K 3.7  ?CL 104  ?CO2 25  ?GLUCOSE 94  ?BUN 19  ?CREATININE 0.92  ?CALCIUM 8.9  ? ? ?Intake/Output Summary (Last 24 hours) at 10/31/2021 1225 ?Last data filed at 10/31/2021 0700 ?Gross per 24 hour  ?Intake 540 ml  ?Output --  ?Net 540 ml  ?  ? ?  ? ?Physical Exam: ?Vital Signs ?Blood pressure (!) 162/90, pulse (!) 55, temperature 97.6 ?F (36.4 ?C), temperature source Oral, resp. rate 16, height '5\' 10"'$  (1.778 m), SpO2 95 %. ? ?Constitutional: No distress . Vital signs reviewed. ?HEENT: NCAT, EOMI, oral membranes moist ?Neck: supple ?Cardiovascular: RRR without murmur. No JVD    ?Respiratory/Chest: CTA Bilaterally without wheezes or rales. Normal effort    ?GI/Abdomen: BS +, non-tender, non-distended ?Ext: no clubbing, cyanosis, or edema ?Psych: pleasant and cooperative  ?Skin: Clean dry. Crani incisions clean/dry, staples out ?Neuro:  appears fatigued, confused. Follows basic commands. Figity. No focal CN findings. 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 Moves all 4's however 3+ to 4/5. Decreased LT in stocking glove distribution in each lower leg and bilateral hands-chronic LE sensory deficits ?Musculoskeletal: no focal tenderness.   ? ? ?Assessment/Plan: ?1. Functional deficits which require 3+ hours per day of interdisciplinary therapy in a comprehensive inpatient rehab setting. ?Physiatrist is providing close team supervision and 24 hour management of active medical problems listed below. ?Physiatrist and rehab team continue to assess barriers to discharge/monitor patient progress toward  functional and medical goals ? ?Care Tool: ? ?Bathing ?   ?Body parts bathed by patient: Right arm, Left arm, Chest, Abdomen, Front perineal area, Right upper leg, Left upper leg, Face  ? Body parts bathed by helper: Buttocks, Right lower leg, Left lower leg ?  ?  ?Bathing assist Assist Level: Moderate Assistance - Patient 50 - 74% ?  ?  ?Upper Body Dressing/Undressing ?Upper body dressing   ?What is the patient wearing?: Pull over shirt ?   ?Upper body assist Assist Level: Moderate Assistance - Patient 50 - 74% ?   ?Lower Body Dressing/Undressing ?Lower body dressing ? ? ?   ?What is the patient wearing?: Pants ? ?  ? ?Lower body assist Assist for lower body dressing: Maximal Assistance - Patient 25 - 49% ?   ? ?Toileting ?Toileting    ?Toileting assist Assist for toileting: Minimal Assistance - Patient > 75% ?  ?  ?Transfers ?Chair/bed transfer ? ?Transfers assist ?   ? ?Chair/bed transfer assist level: Moderate Assistance - Patient 50 - 74% ?  ?  ?Locomotion ?Ambulation ? ? ?Ambulation assist ? ?   ? ?Assist level: Minimal Assistance - Patient > 75% ?Assistive device: Cane-straight ?Max distance: 19'  ? ?Walk 10 feet activity ? ? ?Assist ?   ? ?Assist level:  Minimal Assistance - Patient > 75% ?Assistive device: Cane-straight  ? ?Walk 50 feet activity ? ? ?Assist   ? ?Assist level: Minimal Assistance - Patient > 75% ?Assistive device: Cane-straight  ? ? ?Walk 150 feet activity ? ? ?Assist Walk 150 feet activity did not occur: Safety/medical concerns ? ?  ?  ?  ? ?Walk 10 feet on uneven surface  ?activity ? ? ?Assist   ? ? ?Assist level: Minimal Assistance - Patient > 75% ?   ? ?Wheelchair ? ? ? ? ?Assist Is the patient using a wheelchair?: No ?  ?  ? ?  ?   ? ? ?Wheelchair 50 feet with 2 turns activity ? ? ? ?Assist ? ?  ?  ? ? ?   ? ?Wheelchair 150 feet activity  ? ? ? ?Assist ?   ? ? ?   ? ?Blood pressure (!) 162/90, pulse (!) 55, temperature 97.6 ?F (36.4 ?C), temperature source Oral, resp. rate 16, height 5'  10" (1.778 m), SpO2 95 %. ? ?Medical Problem List and Plan: ?1. 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  Status post right craniotomy resection of tumor worrisome for glioblastoma 10/20/2021.  ?-DECADRON TAPER.  Medical oncology Dr. Mickeal Skinner to follow-up on plan of care ?-surgical path still pending. Will reach out to pathology today ?            -patient may shower ?            -ELOS/Goals: 10-12 days, supervision to min assist goals ? -Continue CIR therapies including PT, OT, and SLP ? -day pass for family visits ?2.  Antithrombotics: ?-DVT/anticoagulation: Awaiting plan from neurosurgery when chronic Eliquis can be resumed. ?            -antiplatelet therapy: N/A ?3. Pain Management/headaches: Oxycodone as needed ? -continue topamax trial '25mg'$  qhs--has been helpful ?4. Mood/sleep:   ?            -antipsychotic agents:  ?3/4 increase seroquel to '75mg'$  tonight ?-suspect bladder, steroid component to hs confusion. He was also excited about seeing family today ?5. Neuropsych: This patient is capable of making decisions on his own behalf. ?6. Skin/Wound Care: Routine skin checks ?            -dc staples today 3/3 ?7. Fluids/Electrolytes/Nutrition: encourage PO ? -3/3 prerenal azo ?  -pushing fluids ?  -BMET wnl   ? -recheck labs Monday ?8.  Seizure prophylaxis.  Keppra 1000 mg twice daily ?9.  History of CAD.  Plavix remains on hold.  No chest pain or shortness of breath.  Continue Imdur 30 mg daily and nitroglycerin as needed ?10.  Diabetes mellitus.  Hemoglobin A1c 9.6.  NovoLog 8 units 3 times daily, Semglee 28 units twice daily. ? CBG (last 3)  ?Recent Labs  ?  10/30/21 ?2109 10/31/21 ?9735 10/31/21 ?1128  ?GLUCAP 169* 160* 191*  ?  3/4 remain elevated--bumped semglee to 30u bid ?-  SSI as we taper decadron, reduce steroids tomorrow ?11.  COPD.  Oxygen dependent 2 L prior to admission.  Continue nebulizers as directed ?12.  Hypertension.  Lopressor 100 mg twice daily ?  3/4 borderline to controlled ?13.  Urinary frequency: ? -UA/UCX negative ? -PVR's 0 ? -begin trial of toviaz '4mg'$  ? ? ? ?LOS: ?4 days ?A FACE TO FACE EVALUATION WAS PERFORMED ? ?Meredith Staggers ?10/31/2021, 12:25 PM  ? ?  ?

## 2021-10-31 NOTE — Progress Notes (Signed)
Patient restless and has not slept well, max of 2 hours sleep approximately. Patient has been in and out of the bed multiple times with urinary frequency or just either wanting to sit at the edge of the bed /chair. Patient is confused and has also been wanting to get ready to see his kids. ?

## 2021-10-31 NOTE — Progress Notes (Addendum)
Pt very impulsive when getting out of chair and bed. Pt removed chair alarm and is out of chair before staff can get to room. Pts wife is on couch in room asleep with pts call bell. States pt can reach call light on bed from chair. Advised wife that pt must of a call bell at all times to prevent falls rather than reaching for button on bed. Pt placed back in bed with all four side rales up per wife's request.  ?

## 2021-11-01 LAB — GLUCOSE, CAPILLARY
Glucose-Capillary: 121 mg/dL — ABNORMAL HIGH (ref 70–99)
Glucose-Capillary: 144 mg/dL — ABNORMAL HIGH (ref 70–99)
Glucose-Capillary: 126 mg/dL — ABNORMAL HIGH (ref 70–99)
Glucose-Capillary: 163 mg/dL — ABNORMAL HIGH (ref 70–99)

## 2021-11-01 MED ORDER — QUETIAPINE FUMARATE 50 MG PO TABS
50.0000 mg | ORAL_TABLET | Freq: Every evening | ORAL | Status: DC | PRN
  Administered 2021-11-01 – 2021-11-05 (×4): 50 mg via ORAL
  Filled 2021-11-01 (×4): qty 1

## 2021-11-01 MED ORDER — DEXAMETHASONE 2 MG PO TABS
2.0000 mg | ORAL_TABLET | Freq: Two times a day (BID) | ORAL | Status: DC
  Administered 2021-11-01 – 2021-11-06 (×10): 2 mg via ORAL
  Filled 2021-11-01 (×10): qty 1

## 2021-11-01 MED ORDER — PHENAZOPYRIDINE HCL 100 MG PO TABS
100.0000 mg | ORAL_TABLET | Freq: Three times a day (TID) | ORAL | Status: AC
  Administered 2021-11-01 – 2021-11-04 (×9): 100 mg via ORAL
  Filled 2021-11-01 (×9): qty 1

## 2021-11-01 MED ORDER — TRAZODONE HCL 50 MG PO TABS
200.0000 mg | ORAL_TABLET | Freq: Every day | ORAL | Status: DC
Start: 2021-11-01 — End: 2021-11-05
  Administered 2021-11-01 – 2021-11-04 (×4): 200 mg via ORAL
  Filled 2021-11-01 (×4): qty 4

## 2021-11-01 MED ORDER — DEXAMETHASONE 2 MG PO TABS: 2.0000 mg | ORAL_TABLET | Freq: Two times a day (BID) | ORAL | Status: DC

## 2021-11-01 NOTE — Progress Notes (Signed)
Speech Language Pathology Daily Session Note ? ?Patient Details  ?Name: Jeff Merced Sr. ?MRN: [id number] ?Date of Birth: 1956-05-10 ? ?Today's Date: 11/01/2021 ?SLP Individual Time: [phone] ?SLP Individual Time Calculation (min): 55 min ? ?Short Term Goals: ?Week 1: SLP Short Term Goal 1 (Week 1): Patient will consume regular texture PO trials with effective mastication, oral clearance, and without overt s/sx of aspiration and sup A cues for swallow safety ?SLP Short Term Goal 2 (Week 1): Patient wil express wants/needs via multimodal communication with mod A verbal/visual cues ?SLP Short Term Goal 3 (Week 1): Patient will name/describe objects/pictures (e.g., object name, function, features) with 50% accuracy with mod-to-max A multimodal cues ?SLP Short Term Goal 4 (Week 1): Patient will respond to simple questions (wh questions, yes/no questions, open-ended questions) with mod A verbal/visual cues to increase communication in functional living environment ?SLP Short Term Goal 5 (Week 1): Patient will follow 1-2 step commands related to functional environment with 50% accuracy and mod A verbal/visual cues ?SLP Short Term Goal 6 (Week 1): Patient will sustain attention to functional tasks for 5-10 minute intervals with mod A verbal redirection ? ?Skilled Therapeutic Interventions: ? Pt was seen for skilled ST targeting cogntive-linguistic goals.  Pt was awake, alert, and agreeable to participating in treatment.  Pt waited for therapist to help him to wheelchair from his recliner prior to standing.  SLP transported pt to [place] treatment room  to maximize attention to task.  Pt was able to name the days of the week and months of the year with min-supervision visual cues to recognize and correct errors.  Pt was able to match word to object from a field of three and then name targeted object with no more than min assist verbal cues to recognize and correct errors.  During confrontational naming and descriptive naming  tasks pt needed mod assist verbal cues to recognize and correct errors.  Pt was able to recognize safety concerns from photographs but needed mod assist to verbally problem solve solutions.  Pt was returned to room and transferred back to recliner.  Wife had many questions regarding speech therapy goals and interventions.  SLP provided skilled education regarding sequela of recovery and realistic prognostic expectations.  SLP also discussed pt's performance on today's targeted tasks.  All questions were answered to pt's wife's satisfaction at this time.  As therapist was leaving, pt was heard removing his safety belt to go to the bathroom.  Wife used call bell to convey needs to nursing and pt waited for nurse tech to arrive with mod cues from SLP.    Continue per current plan of care.    ? ?Pain ?Pain Assessment ?Pain Scale: Faces ?Faces Pain Scale: Hurts little more ?Pain Type: Acute pain ?Pain Location: Shoulder ?Pain Orientation: Left;Right ? ?Therapy/Group: Individual Therapy ? ?Lankford Gutzmer, Selinda Orion ?11/01/2021, 9:24 AM ?

## 2021-11-01 NOTE — Progress Notes (Signed)
Occupational Therapy Session Note ? ?Patient Details  ?Name: Jeff Wood. ?MRN: [id number] ?Date of Birth: 1956-06-05 ? ?Today's Date: 11/01/2021 ?OT Individual Time: 1310-1350 ?OT Individual Time Calculation (min): 40 min  ? ? ?Short Term Goals: ?Week 1:  OT Short Term Goal 1 (Week 1): Patient will don overhead clothing with Min A. ?OT Short Term Goal 2 (Week 1): Patient will don LB clothing with Min A. ?OT Short Term Goal 3 (Week 1): Patient will complete 3/3 grooming tasks in standing with supervision A and no more than 2 verbal cues for sequencing. ?OT Short Term Goal 4 (Week 1): Patient will complete toilet transfers with CGA and LRAD. ? ?Skilled Therapeutic Interventions/Progress Updates:  ?  Pt received sitting in the recliner with 7/10 BLE pain, neuropathy pain- no request for intervention other than rest break as needed. Expressive aphasia with poor awareness of error throughout session. Pt completed a stand pivot transfer with CGA using the RW- poor RW management, requiring cueing for positioning/safety. He was taken to the therapy gym on 4W. He completed 3x blocked practice sit <> stands from the EOM with varying UE support to challenge endurance and BLE strengthening for carryover to ADL transfers. Rest breaks provided between each set. He then completed a functional reaching circuit with a 2lb ball in standing and then seated as he became fatigued. He required encouragement to participate as he became more tired. Pt returned to his room and was sitting up in the recliner with all needs met. Chair alarm set. Daughter and wife present.  ? ?Therapy Documentation ?Precautions:  ?Precautions ?Precautions: Fall ?Precaution Comments: aphasic ?Restrictions ?Weight Bearing Restrictions: No ? ?Therapy/Group: Individual Therapy ? ?Curtis Sites ?11/01/2021, 6:59 AM ?

## 2021-11-01 NOTE — Progress Notes (Signed)
Pt non compliant with safety. Entered the room at shift change to find patient at sink. Wife at bedside and aware of patient being up. Pt taken back to bed with all four side rails up per wife's request. Patient and wife advised to call nursing staff when patient needs to get up.  ?

## 2021-11-01 NOTE — Progress Notes (Signed)
Patient has slept total of 1.5 hours max despite prn trazodone. Patient non compliant with safety, removing belt alarm and transferring self from bed to chair multiple times. Wife at bedside.  ?

## 2021-11-01 NOTE — Progress Notes (Addendum)
?                                                       PROGRESS NOTE ? ? ?Subjective/Complaints: ?Another restless night. Slept less than 2 hours. Up to bathroom multiple times often not voiding at all. Pt up in chair eating breakfast when I entered this morning. Had a pretty good day with family yesterday ? ?ROS: Patient denies fever, rash, sore throat, blurred vision, dizziness, nausea, vomiting, diarrhea, cough, shortness of breath or chest pain, joint or back/neck pain, headache   ? ? ?Objective: ?  ?No results found. ?No results for input(s): WBC, HGB, HCT, PLT in the last 72 hours. ? ?Recent Labs  ?  10/30/21 ?0600  ?NA 137  ?K 3.7  ?CL 104  ?CO2 25  ?GLUCOSE 94  ?BUN 19  ?CREATININE 0.92  ?CALCIUM 8.9  ? ? ?Intake/Output Summary (Last 24 hours) at 11/01/2021 0921 ?Last data filed at 11/01/2021 0744 ?Gross per 24 hour  ?Intake 834 ml  ?Output 6 ml  ?Net 828 ml  ?  ? ?  ? ?Physical Exam: ?Vital Signs ?Blood pressure (!) 145/94, pulse (!) 57, temperature 98.4 ?F (36.9 ?C), resp. rate 16, height '5\' 10"'$  (1.778 m), SpO2 96 %. ? ?Constitutional: No distress . Vital signs reviewed. Hair cut ?HEENT: NCAT, EOMI, oral membranes moist ?Neck: supple ?Cardiovascular: RRR without murmur. No JVD    ?Respiratory/Chest: CTA Bilaterally without wheezes or rales. Normal effort    ?GI/Abdomen: BS +, non-tender, non-distended ?Ext: no clubbing, cyanosis, or edema ?Psych: cooperative, sl distracted, a little confused  ?Skin: Clean dry. Crani incisions clean/dry, staples out ?Neuro:  fatigued appearing, confused Follows basic commands.  No focal CN findings. 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 Moves all 4's however 3+ to 4/5. Decreased LT in stocking glove distribution in each lower leg and bilateral hands-chronic LE sensory deficits ?Musculoskeletal: no focal tenderness.   ? ? ?Assessment/Plan: ?1. Functional deficits which require 3+ hours per day of interdisciplinary therapy in a comprehensive inpatient rehab setting. ?Physiatrist is  providing close team supervision and 24 hour management of active medical problems listed below. ?Physiatrist and rehab team continue to assess barriers to discharge/monitor patient progress toward functional and medical goals ? ?Care Tool: ? ?Bathing ?   ?Body parts bathed by patient: Right arm, Left arm, Chest, Abdomen, Front perineal area, Right upper leg, Left upper leg, Face  ? Body parts bathed by helper: Buttocks, Right lower leg, Left lower leg ?  ?  ?Bathing assist Assist Level: Moderate Assistance - Patient 50 - 74% ?  ?  ?Upper Body Dressing/Undressing ?Upper body dressing   ?What is the patient wearing?: Pull over shirt ?   ?Upper body assist Assist Level: Moderate Assistance - Patient 50 - 74% ?   ?Lower Body Dressing/Undressing ?Lower body dressing ? ? ?   ?What is the patient wearing?: Pants ? ?  ? ?Lower body assist Assist for lower body dressing: Maximal Assistance - Patient 25 - 49% ?   ? ?Toileting ?Toileting    ?Toileting assist Assist for toileting: Minimal Assistance - Patient > 75% ?  ?  ?Transfers ?Chair/bed transfer ? ?Transfers assist ?   ? ?Chair/bed transfer assist level: Moderate Assistance - Patient 50 - 74% ?  ?  ?Locomotion ?Ambulation ? ? ?Ambulation assist ? ?   ? ?  Assist level: Minimal Assistance - Patient > 75% ?Assistive device: Cane-straight ?Max distance: 49'  ? ?Walk 10 feet activity ? ? ?Assist ?   ? ?Assist level: Minimal Assistance - Patient > 75% ?Assistive device: Cane-straight  ? ?Walk 50 feet activity ? ? ?Assist   ? ?Assist level: Minimal Assistance - Patient > 75% ?Assistive device: Cane-straight  ? ? ?Walk 150 feet activity ? ? ?Assist Walk 150 feet activity did not occur: Safety/medical concerns ? ?  ?  ?  ? ?Walk 10 feet on uneven surface  ?activity ? ? ?Assist   ? ? ?Assist level: Minimal Assistance - Patient > 75% ?   ? ?Wheelchair ? ? ? ? ?Assist Is the patient using a wheelchair?: No ?  ?  ? ?  ?   ? ? ?Wheelchair 50 feet with 2 turns  activity ? ? ? ?Assist ? ?  ?  ? ? ?   ? ?Wheelchair 150 feet activity  ? ? ? ?Assist ?   ? ? ?   ? ?Blood pressure (!) 145/94, pulse (!) 57, temperature 98.4 ?F (36.9 ?C), resp. rate 16, height '5\' 10"'$  (1.778 m), SpO2 96 %. ? ?Medical Problem List and Plan: ?1. 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  Status post right craniotomy resection of tumor worrisome for glioblastoma 10/20/2021.  ?            -patient may shower ?            -ELOS/Goals: 10-12 days, supervision to min assist goals ? -Continue CIR therapies including PT, OT, and SLP ? -day pass granted for family visits ? -path + for Grade 4 GBM, neuro-onc f/u, steroid taper ?2.  Antithrombotics: ?-DVT/anticoagulation: Awaiting plan from neurosurgery when chronic Eliquis can be resumed. ?            -antiplatelet therapy: N/A ?3. Pain Management/headaches: Oxycodone as needed ? -continue topamax trial '25mg'$  qhs--has been helpful ?4. Mood/sleep disorder/HS confusion:   ?            -antipsychotic agents: seroquel ?3/5 -suspect this is multifactorial (GBM, bladder, steroids and SE of meds)  ?   -have spoken to Mrs Guzzetta at length re: possible etiologies ?   -will dc topamax and decreased decadron to bid ?  -continue toviaz, add pyridium for bladder urgency ?  -will try his home dose of trazodone '200mg'$  '@2100'$  nightly ?  -use seroquel as back up prn ?  -consider reducing keppra? ? -recheck labs in am ?5. Neuropsych: This patient is capable of making decisions on his own behalf. ?6. Skin/Wound Care: Routine skin checks ?            -dc staples today 3/3 ?7. Fluids/Electrolytes/Nutrition: encourage PO ? -3/5 prerenal azo ?  -pushing fluids ?  -recent BMET wnl, he's eating well  ? -recheck labs Monday ?8.  Seizure prophylaxis.  Keppra 1000 mg twice daily ?9.  History of CAD.  Plavix remains on hold.  No chest pain or shortness of breath.  Continue Imdur 30 mg daily and nitroglycerin as needed ?10.  Diabetes mellitus.  Hemoglobin A1c 9.6.  NovoLog 8 units  3 times daily, Semglee 28 units twice daily. ? CBG (last 3)  ?Recent Labs  ?  10/31/21 ?1656 10/31/21 ?2157 11/01/21 ?0623  ?GLUCAP 267* 193* 121*  ?  3/5 increased semglee to 30u bid 3/4 ?-  SSI as we taper decadron ?11.  COPD.  Oxygen dependent 2 L prior to  admission.  Continue nebulizers as directed ?12.  Hypertension.  Lopressor 100 mg twice daily ?  3/4 borderline to controlled ?13. Urinary frequency: ? -UA/UCX negative ? -PVR's 0 ? -continue trial of toviaz '4mg'$  ? -added pyridium for urgency ? ? ? ?LOS: ?5 days ?A FACE TO FACE EVALUATION WAS PERFORMED ? ?Meredith Staggers ?11/01/2021, 9:21 AM  ? ?  ?

## 2021-11-01 NOTE — Progress Notes (Addendum)
Physical Therapy Session Note ? ?Patient Details  ?Name: Jeff Santana Sr. ?MRN: [id number] ?Date of Birth: [birth year] ? ?Today's Date: 11/01/2021 ?PT Individual Time: 8453-6468; 0321- 1520 ?PT Individual Time Calculation (min): 65 min ; 32 min ? ?Short Term Goals: ?Week 1:  PT Short Term Goal 1 (Week 1): Pt will complete bed moiblity consistently with CGA. ?PT Short Term Goal 2 (Week 1): Pt will complete bed to chair transfer consistently with CGA. ?PT Short Term Goal 3 (Week 1): Pt will ambulate x150' with CGA and LRAD. ?PT Short Term Goal 4 (Week 1): Pt will ascend/descend 2 steps with minA and LRAD. ?   ? ?Skilled Therapeutic Interventions/Progress Updates:  tx 1: ? ?Pt exiting BR using RW, with NT.  Hand cleaner used.  Stand> sit with CGA into recliner.  PT discussed with wife the entries to home.  Main entry 3 low steps, "awkward" per wife; other entry 10 steps, brick, bil rails.PT requested that wife take pics of both entrances and show to primary therapist. ? ?neuromuscular re-education via demo, multimodal cues and visual feedback; seated- trunk flexion /extension with bil pushing/pulling of rolling stool in front of him.  Standing with bil UE support- 10 x 1 each mini squats, heel/toe raises; 15 x 1 each- standing with 1UE support during ipsilateral hip flexion.  Balance challenge and sustained stretch bil heel cords, standing with forefeet on blue wedge, without UE support, x 1.5 min.  No drifting backward noted, even with bil shoulder elevation or supination/pronation. ? ?Sit> stand with close supervision to RW, no cues.  Gait training on level tile, 205' with multiple turns each direction, RW; CGA, no LOB or cues for use of RW during turns.  Gait up/down 5" high curb/step x 2 using RW, min assist for RW mgt, max cues for technique.  Pt self -selected use of R foot leading up and down. ? ?At end of session,in bed for rest, HOB elevated , needs at hand and alarm set. ? ?Tx 2: ? ?PT resting in recliner.  He  reported HA " a little bit" premedicated. ? ?Sit> stand with close supervision, to RW.  Gait training on level tile x 150', RW with close supervision, inclduing several turns.  Gait training in congested therapy room, x 40' weaving in/out of furniture and stepping sideways R or L as needed in narrow spaces, CGA and mod cues for technique when stepping side ways. Route -finding back to room with min cue to look up at signs near ceiling. ? ?Endurance activity, NuStep at level 3, bil UEs with R or LLE, x 8 minutes, 25 steps/min.  Pt unable to tolerate using 4 limbs concurrently; c/o R knee pain, LUE "problems". ? ?At end of session, pt ambulating  to BR with dtr Albina Billet. ?   ? ?Therapy Documentation ?Precautions:  ?Precautions ?Precautions: Fall ?Precaution Comments: aphasic ?Restrictions ?Weight Bearing Restrictions: No ?  ? ? ? ?Therapy/Group: Individual Therapy ? ?Jenniefer Salak ?11/01/2021, 12:30 PM  ?

## 2021-11-02 ENCOUNTER — Inpatient Hospital Stay: Payer: Medicare Other

## 2021-11-02 DIAGNOSIS — Z79899 Other long term (current) drug therapy: Secondary | ICD-10-CM | POA: Insufficient documentation

## 2021-11-02 DIAGNOSIS — C712 Malignant neoplasm of temporal lobe: Secondary | ICD-10-CM | POA: Insufficient documentation

## 2021-11-02 DIAGNOSIS — R4701 Aphasia: Secondary | ICD-10-CM | POA: Insufficient documentation

## 2021-11-02 DIAGNOSIS — R569 Unspecified convulsions: Secondary | ICD-10-CM | POA: Insufficient documentation

## 2021-11-02 LAB — CBC
HCT: 39.6 % (ref 39.0–52.0)
Hemoglobin: 13.4 g/dL (ref 13.0–17.0)
MCH: 32 pg (ref 26.0–34.0)
MCHC: 33.8 g/dL (ref 30.0–36.0)
MCV: 94.5 fL (ref 80.0–100.0)
Platelets: 143 10*3/uL — ABNORMAL LOW (ref 150–400)
RBC: 4.19 MIL/uL — ABNORMAL LOW (ref 4.22–5.81)
RDW: 13.6 % (ref 11.5–15.5)
WBC: 9.1 10*3/uL (ref 4.0–10.5)
nRBC: 0 % (ref 0.0–0.2)

## 2021-11-02 LAB — GLUCOSE, CAPILLARY
Glucose-Capillary: 142 mg/dL — ABNORMAL HIGH (ref 70–99)
Glucose-Capillary: 108 mg/dL — ABNORMAL HIGH (ref 70–99)
Glucose-Capillary: 179 mg/dL — ABNORMAL HIGH (ref 70–99)
Glucose-Capillary: 277 mg/dL — ABNORMAL HIGH (ref 70–99)

## 2021-11-02 LAB — BASIC METABOLIC PANEL
Anion gap: 9 (ref 5–15)
BUN: 18 mg/dL (ref 8–23)
CO2: 24 mmol/L (ref 22–32)
Calcium: 8.9 mg/dL (ref 8.9–10.3)
Chloride: 105 mmol/L (ref 98–111)
Creatinine, Ser: 0.92 mg/dL (ref 0.61–1.24)
GFR, Estimated: >60 mL/min (ref >60)
Glucose, Bld: 187 mg/dL — ABNORMAL HIGH (ref 70–99)
Potassium: 3.8 mmol/L (ref 3.5–5.1)
Sodium: 138 mmol/L (ref 135–145)

## 2021-11-02 NOTE — Progress Notes (Signed)
?                                                       PROGRESS NOTE ? ? ?Subjective/Complaints: ? ?Pt reports doing a little better- had more sleep overnight.  ? ?Daughter taking him to toilet and back-  ?LBM overnight ?Pain "better than 3/10" per pt. .  ? ? ?ROS:  ?Pt denies SOB, abd pain, CP, N/V/C/D, and vision changes ? ? ?Objective: ?  ?No results found. ?Recent Labs  ?  11/02/21 ?0801  ?WBC 9.1  ?HGB 13.4  ?HCT 39.6  ?PLT 143*  ? ? ?No results for input(s): NA, K, CL, CO2, GLUCOSE, BUN, CREATININE, CALCIUM in the last 72 hours. ? ? ?Intake/Output Summary (Last 24 hours) at 11/02/2021 0844 ?Last data filed at 11/02/2021 1540 ?Gross per 24 hour  ?Intake 654 ml  ?Output --  ?Net 654 ml  ?  ? ?  ? ?Physical Exam: ?Vital Signs ?Blood pressure 135/82, pulse (!) 59, temperature 97.8 ?F (36.6 ?C), temperature source Oral, resp. rate 18, height '5\' 10"'$  (1.778 m), SpO2 98 %. ? ? ?General: awake, alert, appropriate, flat; walking to bed with daughter using RW_ appropriate techniques; NAD ?HENT: conjugate gaze; oropharynx moist- L head crani incision- looks OK ?CV: regular rate; no JVD ?Pulmonary: CTA B/L; no W/R/R- good air movement ?GI: soft, NT, ND, (+)BS ?Psychiatric: appropriate; flat ?Neurological: vague; quiet; alert but not asking spontaneous questions ?Ext: no clubbing, cyanosis, or edema ?Psych: cooperative, sl distracted, a little confused  ?Skin: Clean dry. Crani incisions clean/dry, staples out ?Neuro:  fatigued appearing, confused Follows basic commands.  No focal CN findings. 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 Moves all 4's however 3+ to 4/5. Decreased LT in stocking glove distribution in each lower leg and bilateral hands-chronic LE sensory deficits ?Musculoskeletal: no focal tenderness.   ? ? ?Assessment/Plan: ?1. Functional deficits which require 3+ hours per day of interdisciplinary therapy in a comprehensive inpatient rehab setting. ?Physiatrist is providing close team supervision and 24 hour management of  active medical problems listed below. ?Physiatrist and rehab team continue to assess barriers to discharge/monitor patient progress toward functional and medical goals ? ?Care Tool: ? ?Bathing ?   ?Body parts bathed by patient: Right arm, Left arm, Chest, Abdomen, Front perineal area, Right upper leg, Left upper leg, Face  ? Body parts bathed by helper: Buttocks, Right lower leg, Left lower leg ?  ?  ?Bathing assist Assist Level: Moderate Assistance - Patient 50 - 74% ?  ?  ?Upper Body Dressing/Undressing ?Upper body dressing   ?What is the patient wearing?: Pull over shirt ?   ?Upper body assist Assist Level: Moderate Assistance - Patient 50 - 74% ?   ?Lower Body Dressing/Undressing ?Lower body dressing ? ? ?   ?What is the patient wearing?: Pants ? ?  ? ?Lower body assist Assist for lower body dressing: Maximal Assistance - Patient 25 - 49% ?   ? ?Toileting ?Toileting    ?Toileting assist Assist for toileting: Minimal Assistance - Patient > 75% ?  ?  ?Transfers ?Chair/bed transfer ? ?Transfers assist ?   ? ?Chair/bed transfer assist level: Moderate Assistance - Patient 50 - 74% ?  ?  ?Locomotion ?Ambulation ? ? ?Ambulation assist ? ?   ? ?Assist level: Contact Guard/Touching assist ?Assistive device: Walker-rolling ?  Max distance: 205  ? ?Walk 10 feet activity ? ? ?Assist ?   ? ?Assist level: Contact Guard/Touching assist ?Assistive device: Walker-rolling  ? ?Walk 50 feet activity ? ? ?Assist   ? ?Assist level: Contact Guard/Touching assist ?Assistive device: Walker-rolling  ? ? ?Walk 150 feet activity ? ? ?Assist Walk 150 feet activity did not occur: Safety/medical concerns ? ?Assist level: Contact Guard/Touching assist ?Assistive device: Walker-rolling ?  ? ?Walk 10 feet on uneven surface  ?activity ? ? ?Assist   ? ? ?Assist level: Minimal Assistance - Patient > 75% ?   ? ?Wheelchair ? ? ? ? ?Assist Is the patient using a wheelchair?: No ?  ?  ? ?  ?   ? ? ?Wheelchair 50 feet with 2 turns  activity ? ? ? ?Assist ? ?  ?  ? ? ?   ? ?Wheelchair 150 feet activity  ? ? ? ?Assist ?   ? ? ?   ? ?Blood pressure 135/82, pulse (!) 59, temperature 97.8 ?F (36.6 ?C), temperature source Oral, resp. rate 18, height '5\' 10"'$  (1.778 m), SpO2 98 %. ? ?Medical Problem List and Plan: ?1. 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  Status post right craniotomy resection of tumor worrisome for glioblastoma 10/20/2021.  ?            -patient may shower ?            -ELOS/Goals: 10-12 days, supervision to min assist goals ? -con't CIR- PT, OT and SLP ?-day pass granted for family visits ? -path + for Grade 4 GBM, neuro-onc f/u, steroid taper ?  ?2.  Antithrombotics: ?-DVT/anticoagulation: Awaiting plan from neurosurgery when chronic Eliquis can be resumed. ?            -antiplatelet therapy: N/A ?3. Pain Management/headaches: Oxycodone as needed ? -continue topamax trial '25mg'$  qhs--has been helpful ?4. Mood/sleep disorder/HS confusion:   ?            -antipsychotic agents: seroquel ?3/5 -suspect this is multifactorial (GBM, bladder, steroids and SE of meds)  ?   -have spoken to Mrs Abernathy at length re: possible etiologies ?   -will dc topamax and decreased decadron to bid ?  -continue toviaz, add pyridium for bladder urgency ?  -will try his home dose of trazodone '200mg'$  '@2100'$  nightly ?  -use seroquel as back up prn ?  -consider reducing keppra? ? -recheck labs in am ?3/6- BMP pending; WBC 9.1-  ?5. Neuropsych: This patient is capable of making decisions on his own behalf. ?6. Skin/Wound Care: Routine skin checks ?            -dc staples today 3/3 ?7. Fluids/Electrolytes/Nutrition: encourage PO ? -3/5 prerenal azo ?  -pushing fluids ?  -recent BMET wnl, he's eating well ?3/6- BMP pending  ? -recheck labs Monday ?8.  Seizure prophylaxis.  Keppra 1000 mg twice daily ?9.  History of CAD.  Plavix remains on hold.  No chest pain or shortness of breath.  Continue Imdur 30 mg daily and nitroglycerin as needed ?10.  Diabetes  mellitus.  Hemoglobin A1c 9.6.  NovoLog 8 units 3 times daily, Semglee 28 units twice daily. ? CBG (last 3)  ?Recent Labs  ?  11/01/21 ?1620 11/01/21 ?2001 11/02/21 ?0606  ?GLUCAP 126* 163* 142*  ?  3/5 increased semglee to 30u bid 3/4 ? 3/6- CBGs adequate control- con't regimen ?-  SSI as we taper decadron ?11.  COPD.  Oxygen dependent  2 L prior to admission.  Continue nebulizers as directed ?12.  Hypertension.  Lopressor 100 mg twice daily ?  3/4 borderline to controlled ? 3/6- BP controlled in last 24 hours ?13. Urinary frequency: ? -UA/UCX negative ? -PVR's 0 ? -continue trial of toviaz '4mg'$  ? -added pyridium for urgency ? ? ? ?LOS: ?6 days ?A FACE TO FACE EVALUATION WAS PERFORMED ? ?Jeff Wood ?11/02/2021, 8:44 AM  ? ?  ?

## 2021-11-02 NOTE — Progress Notes (Signed)
Occupational Therapy Session Note ? ?Patient Details  ?Name: Jeff Crooker Sr. ?MRN: [id number] ?Date of Birth: 1955/12/08 ? ?Today's Date: 11/02/2021 ?OT Individual Time: 9509-3267 ?OT Individual Time Calculation (min): 38 min  ? ? ?Short Term Goals: ?Week 1:  OT Short Term Goal 1 (Week 1): Patient will don overhead clothing with Min A. ?OT Short Term Goal 2 (Week 1): Patient will don LB clothing with Min A. ?OT Short Term Goal 3 (Week 1): Patient will complete 3/3 grooming tasks in standing with supervision A and no more than 2 verbal cues for sequencing. ?OT Short Term Goal 4 (Week 1): Patient will complete toilet transfers with CGA and LRAD. ? ? ?Skilled Therapeutic Interventions/Progress Updates:  ?  Pt greeted at time of session sitting up in recliner agreeable to OT session but stating he is fatigued and requesting pain medication, nursing aware and provided on the way to the gym, pain not rated. Pt also stating he was waiting on breakfast, gently oriented to time at around 3pm and that dinner would be next meal, pt requesting snack. Family arrived at this time as well, present at end of session to visit with the pt. Walked short distance in room recliner > wheelchair Supervision and leaving walker behind despite cues. Transported to nurses station for snack and nurse to provide pain meds. Transported to gym and stand pivot wheelchair <> mat no AD Supervision/CGA. Pt performing the following BUE there ex with 3# dowel: bicep curl, chest press, FWD circles all while performing dual task of carrying conversation and answering questions to improve attention during tasks. Note aphasia limiting accuracy of answers throughout session. Back in room, stand pivot to recliner and alarm on call bell in reach.  ? ? ? ?Therapy Documentation ?Precautions:  ?Precautions ?Precautions: Fall ?Precaution Comments: aphasic ?Restrictions ?Weight Bearing Restrictions: No ? ? ? ? ?Therapy/Group: Individual Therapy ? ?Jeff Wood ?11/02/2021, 1:00 PM ?

## 2021-11-02 NOTE — Progress Notes (Signed)
Occupational Therapy Session Note ? ?Patient Details  ?Name: Jeff Curt Sr. ?MRN: [id number] ?Date of Birth: August 30, 1956 ? ?Today's Date: 11/02/2021 ?OT Individual Time: 9357-0177 ?OT Individual Time Calculation (min): 28 min  ? ? ?Short Term Goals: ?Week 1:  OT Short Term Goal 1 (Week 1): Patient will don overhead clothing with Min A. ?OT Short Term Goal 2 (Week 1): Patient will don LB clothing with Min A. ?OT Short Term Goal 3 (Week 1): Patient will complete 3/3 grooming tasks in standing with supervision A and no more than 2 verbal cues for sequencing. ?OT Short Term Goal 4 (Week 1): Patient will complete toilet transfers with CGA and LRAD. ? ?Skilled Therapeutic Interventions/Progress Updates:  ?  Pt resting in recliner upon arrival with daughter present. Pt somewhat lethargic during session, primarily when seated. Pt with increased arousal when ambulating in room. Expressive aphasia apparent during conversation but follows simple commands appropriately. Amb in room to window to lower shade. Amb to sink but declined shaving. Pt unable to accurately recall/verbalize lunch meal. Pt with difficulty keeping eyes open and engaged when seated. All amb with min A HHA. Pt remained in relciner with belt alarm activated. All needs within reach and daughter present.  ? ?Therapy Documentation ?Precautions:  ?Precautions ?Precautions: Fall ?Precaution Comments: aphasic ?Restrictions ?Weight Bearing Restrictions: No ?: ? ?Pain: ?Pt denies pain this afternoon ? ? ?Therapy/Group: Individual Therapy ? ?Leroy Libman ?11/02/2021, 1:31 PM ?

## 2021-11-02 NOTE — Progress Notes (Signed)
Physical Therapy Session Note ? ?Patient Details  ?Name: Jeff Wageman Sr. ?MRN: [id number] ?Date of Birth: 1956-01-31 ? ?Today's Date: 11/02/2021 ?PT Individual Time: 2876-8115 ?PT Individual Time Calculation (min): 26 min  ? ?Short Term Goals: ?Week 1:  PT Short Term Goal 1 (Week 1): Pt will complete bed moiblity consistently with CGA. ?PT Short Term Goal 2 (Week 1): Pt will complete bed to chair transfer consistently with CGA. ?PT Short Term Goal 3 (Week 1): Pt will ambulate x150' with CGA and LRAD. ?PT Short Term Goal 4 (Week 1): Pt will ascend/descend 2 steps with minA and LRAD. ? ?Skilled Therapeutic Interventions/Progress Updates:  ?   ?Pt received seated in recliner with alarms intact. Pt agrees to therapy and no complaint of pain. Pt does appear to be slightly more confused than previous session with this therapist 4 days prior. Sit to stand with CGA and stand step transfer to [facility] with cues for positioning. WC transport outside for time management and energy conservation. Pt completes gait training outdoors for ambulation over unlevel and varying surfaces. Pt ambulates x200', and 2x150' with seated rest break. PT provides CGA and cues for upright gaze to improve posture and balance, and increasing gait speed to decrease risk for falls, though pt says that he does not want to walk faster and does not noticeably alter gait speed. WC transport back to room. Stand step transfer back to recliner with CGA. Left with alarms intact and all needs within reach. ? ?Therapy Documentation ?Precautions:  ?Precautions ?Precautions: Fall ?Precaution Comments: aphasic ?Restrictions ?Weight Bearing Restrictions: No ? ? ? ?Therapy/Group: Individual Therapy ? ?Breck Coons, PT, DPT ?11/02/2021, 3:54 PM  ?

## 2021-11-02 NOTE — Progress Notes (Signed)
Patient ID: Jeff Wenke., male   DOB: [birth year], 66 y.o.   MRN: [id number] ? ? ? ?Progress Note from the Palliative Medicine Team at [facility] ? ? ?Patient Name: Jeff Frieden Sr.        ?Date: 11/02/2021 ?DOB: 1956-08-12  Age: 66 y.o. MRN#: [id number] ?Attending Physician: Meredith Staggers, MD ?Primary Care Physician: Pcp, No ?Admit Date: 10/27/2021 ? ? ?Medical records reviewed  ? ? 66 y.o. 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    UDS negative.   ?  ?CT head concerning for 3 to 4 cm region of subacute infarction versus mass in left posterior temporal lobe.  Neurology consulted.  He was started on Keppra 500 mg twice daily. Spot and LTM EEG without seizure or epileptiform discharge.   ?  ?CT angio head and neck and CT cerebral perfusion with contrast without LVO but showed 2.5 cm hyperemic mass in left posterior temporal lobe.   ?  ?MRI brain without contrast also showed mass lesion of the left temporal lobe with associated edema but truncated exam due to patient's inability to tolerate setting of mental status change. ?  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   ?  ?Neurosurgery consulted and started Decadron. Left temporal craniotomy and resection of tumor on 2/21.    ?  ?Follow-up cranial CT scan 10/26/2021 showed stable resection cavity with small volume adjacent parenchymal blood unchanged mild regional mass effect no new intracranial abnormality. ? ?Patient is currently in  CIR and is progressing with therapies. ?  ? ?This NP visited patient at the bedside as a follow up for palliative medicine needs and emotional  support. ? ?Patient's wife and daughter at bedside.   Continued conversation regarding current medical situation, treatment option decisions moving forward, goals of care, and anticipatory care needs. ? ?Patient has a daughter who is a NP and she  is supportive/helpful  in navigating health records and helping family navigate decisions.  ? ? Wife shows me her tee shirt "wearing gray for brain tumor awareness",  "we found out by looking in "my chart " that he has a GBM, a bad brain tumor"    ? ?Family are waiting to talk with a physician regarding pathology and what next steps are to be taken. ? ?I spoke to Mont Dutton /Radiation Special Procedure Navigator, she will relay this request to Dr Trenton Gammon Neurosurgery.    ?Also she plans to talk with family tomorrow regarding the next steps in treatment plan as [name] moves forward. ? ?Family appreciates this update. ? ? ?Plan of care ?-DNR/DNI ?-No artificial feeding now or in the future ?-Complete stay at [facility] and transition home ?-Follow-up with outpatient oncology for ongoing treatment options ? ?     Ultimately comfort is a priority. Family hope for ongoing  improvement and continued quality of life ? ? ?Discussed with family the importance of continued conversation with each other and the medical providers regarding overall plan of care and treatment options,  ensuring decisions are within the context of the patients values and GOCs. ? ?Questions and concerns addressed   ?  ?  Total time spent on the unit was 50 minutes ? ?Greater than 50% of the time was spent in counseling and coordination of care ? ?Wadie Lessen NP  ?Palliative Medicine Team Team Phone # [phone] ?Pager [phone] ?  ?

## 2021-11-02 NOTE — Progress Notes (Signed)
Speech Language Pathology Daily Session Note ? ?Patient Details  ?Name: Jeff Buelow Sr. ?MRN: [id number] ?Date of Birth: 1955-12-27 ? ?Today's Date: 11/02/2021 ?SLP Individual Time: 9024-0973 ?SLP Individual Time Calculation (min): 45 min ? ?Short Term Goals: ?Week 1: SLP Short Term Goal 1 (Week 1): Patient will consume regular texture PO trials with effective mastication, oral clearance, and without overt s/sx of aspiration and sup A cues for swallow safety ?SLP Short Term Goal 2 (Week 1): Patient wil express wants/needs via multimodal communication with mod A verbal/visual cues ?SLP Short Term Goal 3 (Week 1): Patient will name/describe objects/pictures (e.g., object name, function, features) with 50% accuracy with mod-to-max A multimodal cues ?SLP Short Term Goal 4 (Week 1): Patient will respond to simple questions (wh questions, yes/no questions, open-ended questions) with mod A verbal/visual cues to increase communication in functional living environment ?SLP Short Term Goal 5 (Week 1): Patient will follow 1-2 step commands related to functional environment with 50% accuracy and mod A verbal/visual cues ?SLP Short Term Goal 6 (Week 1): Patient will sustain attention to functional tasks for 5-10 minute intervals with mod A verbal redirection ? ?Skilled Therapeutic Interventions: Skilled ST treatment focused on cognitive-communication goals. Patient was lethargic and required max A verbal redirection to sustain attention for 2-3 minute duration. A lower frustration tolerance was witnessed as compared to other sessions. Pt required overall max A for both verbal expression and comprehension tasks and suspect this attributed to lethargy. Pt required total A for determining which item did not belong in field of 4 objects, and total A for determining similarities of 2 common objects (book, newspaper). Pt completed object naming with overall mod A for accuracy. Pt benefited minimally from cueing hierarchy used in  previous sessions such as semantic/phonemic/written cues or field of 2 choices. Pt with poor awareness of verbal errors and when cued to repair pt would express "that's what I said" or "you're confusing me." SLP significantly reduced complexity of tasks and facilitated card matching by number (pt is color blind therefore avoided any tasks that relied on color). Pt completed task for 3 minute duration with min-to-mod A for error awareness. Patient was left in wheelchair and passed off to OT for following session. Continue per current plan of care.   ?   ?Pain ?Pain Assessment ?Pain Scale: 0-10 ?Pain Score: 0-No pain ? ?Therapy/Group: Individual Therapy ? ?Izzy Doubek T Roan Sawchuk ?11/02/2021, 10:09 AM ?

## 2021-11-02 NOTE — Progress Notes (Signed)
Occupational Therapy Session Note ? ?Patient Details  ?Name: Jeff Monts Sr. ?MRN: [id number] ?Date of Birth: [birth year] ? ?Today's Date: 11/02/2021 ?OT Individual Time: 1015-1100 ?OT Individual Time Calculation (min): 45 min  ? ? ?Short Term Goals: ?Week 1:  OT Short Term Goal 1 (Week 1): Patient will don overhead clothing with Min A. ?OT Short Term Goal 2 (Week 1): Patient will don LB clothing with Min A. ?OT Short Term Goal 3 (Week 1): Patient will complete 3/3 grooming tasks in standing with supervision A and no more than 2 verbal cues for sequencing. ?OT Short Term Goal 4 (Week 1): Patient will complete toilet transfers with CGA and LRAD. ? ?Skilled Therapeutic Interventions/Progress Updates: Patient seen for OT session this am.  Patient's daughter, Jeff Wood, in room throughout session. Patient brought back to room in wheelchair from SLP session.  SLP reported patient lethargic.  Patient having difficulty maintaining eyes open - but agreeable to shower.   ?Helped patient to undress.  Mod assist to remove tight fitting pullover sweatshirt.  Patient walked to bathroom with min assist and no device.  Max assist to doff (and later don) compression hose and slipper socks.  Patient with significant edema in bilateral feet and ankles/lower leg.   ?Patient assisted to shower seat, and patient immediately more alert.  Patient able to wash self while seated, and then standing to wash periarea.   ?Patient able to dry self off then could put on pants while standing and with one UE support on grab bar.   ?Patient able to talk, and joke while in shower.  Teasing daughter, etc - although expressive aphasia evident - able to use tone of voice and facial expression to convey meaning.   ?Patient left in recliner chair with safety belt in place and turned on.  Daughter left to get patient a salad - but nurse aware, and patient beginning to doze in his chair.   ?   ? ?Therapy Documentation ?Precautions:  ?Precautions ?Precautions:  Fall ?Precaution Comments: aphasic ?Restrictions ?Weight Bearing Restrictions: No ?  ? ?Pain: 7/10 Headache at beginning of session.  Called nurse who brought pain medication.  Proceeded with shower - afterward reported 3/10 headache.  "Much better."   ?Patient requested pain topical cream after shower on Bilateral knees.   ? ? ? ? ? ?Mariah Milling OTR/L ?11/02/2021, 11:17 AM ?

## 2021-11-03 ENCOUNTER — Other Ambulatory Visit: Payer: Self-pay | Admitting: Radiation Therapy

## 2021-11-03 DIAGNOSIS — C719 Malignant neoplasm of brain, unspecified: Secondary | ICD-10-CM

## 2021-11-03 LAB — GLUCOSE, CAPILLARY
Glucose-Capillary: 187 mg/dL — ABNORMAL HIGH (ref 70–99)
Glucose-Capillary: 155 mg/dL — ABNORMAL HIGH (ref 70–99)
Glucose-Capillary: 200 mg/dL — ABNORMAL HIGH (ref 70–99)
Glucose-Capillary: 218 mg/dL — ABNORMAL HIGH (ref 70–99)

## 2021-11-03 MED ORDER — LEVETIRACETAM 750 MG PO TABS
750.0000 mg | ORAL_TABLET | Freq: Two times a day (BID) | ORAL | Status: DC
  Administered 2021-11-03 – 2021-11-06 (×6): 750 mg via ORAL
  Filled 2021-11-03 (×6): qty 1

## 2021-11-03 MED ORDER — TOPIRAMATE 25 MG PO TABS
25.0000 mg | ORAL_TABLET | Freq: Every day | ORAL | Status: DC
Start: 2021-11-03 — End: 2021-11-06
  Administered 2021-11-03 – 2021-11-05 (×3): 25 mg via ORAL
  Filled 2021-11-03 (×3): qty 1

## 2021-11-03 MED ORDER — TOPIRAMATE 25 MG PO TABS
25.0000 mg | ORAL_TABLET | ORAL | Status: AC
  Administered 2021-11-03: 25 mg via ORAL
  Filled 2021-11-03: qty 1

## 2021-11-03 NOTE — Progress Notes (Signed)
Speech Language Pathology Daily Session Note ? ?Patient Details  ?Name: Jeff Shelp Sr. ?MRN: [id number] ?Date of Birth: 11-Jul-1956 ? ?Today's Date: 11/03/2021 ?SLP Individual Time: 0930-1000 ?SLP Individual Time Calculation (min): 30 min ? ?Short Term Goals: ?Week 1: SLP Short Term Goal 1 (Week 1): Patient will consume regular texture PO trials with effective mastication, oral clearance, and without overt s/sx of aspiration and sup A cues for swallow safety ?SLP Short Term Goal 2 (Week 1): Patient wil express wants/needs via multimodal communication with mod A verbal/visual cues ?SLP Short Term Goal 3 (Week 1): Patient will name/describe objects/pictures (e.g., object name, function, features) with 50% accuracy with mod-to-max A multimodal cues ?SLP Short Term Goal 4 (Week 1): Patient will respond to simple questions (wh questions, yes/no questions, open-ended questions) with mod A verbal/visual cues to increase communication in functional living environment ?SLP Short Term Goal 5 (Week 1): Patient will follow 1-2 step commands related to functional environment with 50% accuracy and mod A verbal/visual cues ?SLP Short Term Goal 6 (Week 1): Patient will sustain attention to functional tasks for 5-10 minute intervals with mod A verbal redirection ? ?Skilled Therapeutic Interventions:Skilled ST services focused on language skills. SLP facilitated naming object, photograph of object and black/white drawing of object, pt demonstrated 70% accuracy in each task with mild semantic errors. Pt was better able to name object function given actual object verse photos/drawing, with 40% accuracy decreasing to 20% accuracy with max A semantic errors. SLP questions awareness of verbal errors at the end of the session verse poor frustration tolerance. Pt demonstrated sustained attention in 10 minute intervals with mod A verbal cues. Pt was left in room with family, call bell within reach and chair alarm set. SLP recommends to  continue skilled services. ?   ? ?Pain ?Pain Assessment ?Pain Scale: 0-10 ?Pain Score: 0-No pain ?Pain Type: Acute pain ?Pain Location: Head ?Pain Descriptors / Indicators: Headache ?Pain Frequency: Constant ?Pain Onset: On-going ?Patients Stated Pain Goal: 2 ?Pain Intervention(s): Medication (See eMAR) ? ?Therapy/Group: Individual Therapy ? ?[name] ?11/03/2021, 10:12 AM ?

## 2021-11-03 NOTE — Progress Notes (Signed)
Physical Therapy Session Note ? ?Patient Details  ?Name: Jeff Cremer Sr. ?MRN: [id number] ?Date of Birth: May 10, 1956 ? ?Today's Date: 11/03/2021 ?PT Individual Time: 4403-4742 and [phone] ?PT Individual Time Calculation (min): 53 min and 41 min ? ?Short Term Goals: ?Week 1:  PT Short Term Goal 1 (Week 1): Pt will complete bed moiblity consistently with CGA. ?PT Short Term Goal 2 (Week 1): Pt will complete bed to chair transfer consistently with CGA. ?PT Short Term Goal 3 (Week 1): Pt will ambulate x150' with CGA and LRAD. ?PT Short Term Goal 4 (Week 1): Pt will ascend/descend 2 steps with minA and LRAD. ? ?Skilled Therapeutic Interventions/Progress Updates:  ?   ? ?1st Session: Pt received sitting on toilet with daughter assisting. Agreeable to therapy and reports pain in legs "from getting old". Number not provided. PT provides rest breaks and mobility to manage pain. Daughter ambulates pt to [facility]. PT has discussion with wife regarding assistance of pt in room and provides education on guarding pt when ambulating to/from restroom. Pt's wife signed for caregiver training. WC transport to daryoom for time management. Sit to stand with cues for initiation. Pt ambulates x175' with RW, slowly, with cues for upright gaze to improve posture and balance. ? ?Following seated rest break, pt attempts Wii bowling game to challenge dynamic balance and coordination. Pt is unable to effectively manipulate Wii controller despite multiple hand over hand cues and attempts. ? ?Pt transfers to Nustep with cues for positioning. [place][ performed for strength and endurance training. Pt cued to maintain average steps per minute ~40. Pt completes x8:00 at self selected rate, much slower than 40 steps per minute, and requires multiple rest breaks due to fatigue. Stand step from nustep>WC>recliner with CGA. Left seated with alarm intact and all needs within reach. ? ?2nd Session: Pt received attempting to stand up from recliner with chair  alarm going off. PT cues pt to sit back down and pt is redirectable. Agrees to therapy and no complaint of pain. Stand step transfer to [facility] with verbal cues for positioning. WC transport to gym. Pt given choice of ambulating with or without RW and pt chooses to attempt without. Pt ambulates x175' with brief standing rest break, leaning against RN station, with CGA and cues to increase bilateral stride length. Seated rest break. ? ?Pt performs dynamic gait challenge, sidestepping in and out of cones with CGA going forward, minA going backward, and cues for posture and gaze to improve balance. Following rest break, pt utilizes same cones and performs multiple 180 degree turns to "weave" in and out of cones, requiring minA cues at hips to facilitate turns as pt attempts to side step. Pt then performs same activity, but incorporates tapping L foot on cones and then R foot on cones. MinA when tapping L foot due to lack of postural adjustment and weight shifting, and CGA when tapping R foot. ? ?WC transport back to room. Stand step transfer to recliner without assistance. Left with seat belt and chair alarm intact. All needs within reach. ? ?Therapy Documentation ?Precautions:  ?Precautions ?Precautions: Fall ?Precaution Comments: aphasic ?Restrictions ?Weight Bearing Restrictions: No ? ? ? ?Therapy/Group: Individual Therapy ? ?Breck Coons, PT, DPT ?11/03/2021, 3:45 PM  ?

## 2021-11-03 NOTE — Progress Notes (Signed)
Patient ID: Jeff Wood., male   DOB: [birth year], 66 y.o.   MRN: [id number] ? ?SW went by pt room to provide updates, but no one in the room. SW will follow-up with pt family. ? ?Loralee Pacas, MSW, LCSWA ?Office: [phone] ?Cell: [phone] ?Fax: [phone]  ?

## 2021-11-03 NOTE — Progress Notes (Signed)
?                                                       PROGRESS NOTE ? ? ?Subjective/Complaints: ? ?Having headaches again. Says he slept a few hours. Legs more swollen as he slept with legs down overnight.  ? ?ROS: Patient denies fever, rash, sore throat, blurred vision, dizziness, nausea, vomiting, diarrhea, cough, shortness of breath or chest pain, joint or back/neck pain, headache, or mood change.  ? ? ?Objective: ?  ?No results found. ?Recent Labs  ?  11/02/21 ?0801  ?WBC 9.1  ?HGB 13.4  ?HCT 39.6  ?PLT 143*  ? ? ?Recent Labs  ?  11/02/21 ?0801  ?NA 138  ?K 3.8  ?CL 105  ?CO2 24  ?GLUCOSE 187*  ?BUN 18  ?CREATININE 0.92  ?CALCIUM 8.9  ? ? ? ?Intake/Output Summary (Last 24 hours) at 11/03/2021 1156 ?Last data filed at 11/03/2021 7425 ?Gross per 24 hour  ?Intake 337 ml  ?Output --  ?Net 337 ml  ?  ? ?  ? ?Physical Exam: ?Vital Signs ?Blood pressure (!) 151/91, pulse (!) 57, temperature (!) 97.5 ?F (36.4 ?C), temperature source Oral, resp. rate 16, height '5\' 10"'$  (1.778 m), weight 115.8 kg, SpO2 98 %. ? ? ?General: awake, alert, appropriate, flat; walking to bed with daughter using RW_ appropriate techniques; NAD ?HENT: conjugate gaze; oropharynx moist- L head crani incision- looks OK ?CV: regular rate; no JVD ?Pulmonary: CTA B/L; no W/R/R- good air movement ?GI: soft, NT, ND, (+)BS ?Psychiatric: appropriate; flat ?Neurological: vague; quiet; alert but not asking spontaneous questions ?Ext: no clubbing, cyanosis, 1+ bilateral LE edema ?Psych: cooperative, sl distracted, a little confused  ?Skin: Clean dry. Crani incisions clean/dry, staples out ?Neuro:  fatigued, word finding deficits. Follows commands.  No focal CN findings. 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 Moves all 4's however 3+ to 4/5. Decreased LT in stocking glove distribution in each lower leg and bilateral hands-chronic LE sensory deficits ?Musculoskeletal: no focal tenderness.   ? ? ?Assessment/Plan: ?1. Functional deficits which require 3+ hours per  day of interdisciplinary therapy in a comprehensive inpatient rehab setting. ?Physiatrist is providing close team supervision and 24 hour management of active medical problems listed below. ?Physiatrist and rehab team continue to assess barriers to discharge/monitor patient progress toward functional and medical goals ? ?Care Tool: ? ?Bathing ?   ?Body parts bathed by patient: Right arm, Left arm, Chest, Abdomen, Buttocks, Right upper leg, Left upper leg, Left lower leg, Face, Right lower leg, Front perineal area  ? Body parts bathed by helper: Buttocks, Right lower leg, Left lower leg ?  ?  ?Bathing assist Assist Level: Minimal Assistance - Patient > 75% ?  ?  ?Upper Body Dressing/Undressing ?Upper body dressing   ?What is the patient wearing?: Pull over shirt ?   ?Upper body assist Assist Level: Moderate Assistance - Patient 50 - 74% (mod assist to doff tight fitting sweatshirt) ?   ?Lower Body Dressing/Undressing ?Lower body dressing ? ? ?   ?What is the patient wearing?: Pants, Underwear/pull up ? ?  ? ?Lower body assist Assist for lower body dressing: Minimal Assistance - Patient > 75% ?   ? ?Toileting ?Toileting Toileting Activity did not occur (Probation officer and hygiene only): N/A (no void or bm)  ?Toileting assist  Assist for toileting: Minimal Assistance - Patient > 75% ?  ?  ?Transfers ?Chair/bed transfer ? ?Transfers assist ?   ? ?Chair/bed transfer assist level: Moderate Assistance - Patient 50 - 74% ?  ?  ?Locomotion ?Ambulation ? ? ?Ambulation assist ? ?   ? ?Assist level: Contact Guard/Touching assist ?Assistive device: Walker-rolling ?Max distance: 205  ? ?Walk 10 feet activity ? ? ?Assist ?   ? ?Assist level: Contact Guard/Touching assist ?Assistive device: Walker-rolling  ? ?Walk 50 feet activity ? ? ?Assist   ? ?Assist level: Contact Guard/Touching assist ?Assistive device: Walker-rolling  ? ? ?Walk 150 feet activity ? ? ?Assist Walk 150 feet activity did not occur: Safety/medical  concerns ? ?Assist level: Contact Guard/Touching assist ?Assistive device: Walker-rolling ?  ? ?Walk 10 feet on uneven surface  ?activity ? ? ?Assist   ? ? ?Assist level: Minimal Assistance - Patient > 75% ?   ? ?Wheelchair ? ? ? ? ?Assist Is the patient using a wheelchair?: No ?  ?  ? ?  ?   ? ? ?Wheelchair 50 feet with 2 turns activity ? ? ? ?Assist ? ?  ?  ? ? ?   ? ?Wheelchair 150 feet activity  ? ? ? ?Assist ?   ? ? ?   ? ?Blood pressure (!) 151/91, pulse (!) 57, temperature (!) 97.5 ?F (36.4 ?C), temperature source Oral, resp. rate 16, height '5\' 10"'$  (1.778 m), weight 115.8 kg, SpO2 98 %. ? ?Medical Problem List and Plan: ?1. 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  Status post right craniotomy resection of tumor worrisome for glioblastoma 10/20/2021.  ?            -patient may shower ?            -ELOS/Goals: 10-12 days, supervision to min assist goals ? -Continue CIR therapies including PT, OT, and SLP. Interdisciplinary team conference today to discuss goals, barriers to discharge, and dc planning.   ?-day pass granted for family visits ? -path + for Grade 4 GBM, neuro-onc f/u, steroid taper ? -appreciate palliative care input.   ? -Dr. Mickeal Skinner to see 3/13 in office to discuss further plan ?2.  Antithrombotics: ?-DVT/anticoagulation: Awaiting plan from neurosurgery when chronic Eliquis can be resumed. ?            -antiplatelet therapy: N/A ?3. Pain Management/headaches: Oxycodone as needed ? -resume topamax as h/a's recurred off medication ?4. Mood/sleep disorder/HS confusion:   ?            -antipsychotic agents: seroquel ?3/7 -suspect this is multifactorial (GBM, bladder, steroids and SE of meds)  ?   -have spoken to Mrs Witherspoon at length re: possible etiologies ?  -continue toviaz, add pyridium for bladder urgency ?-resume topamax at HS ?  -will try his home dose of trazodone '200mg'$  '@2100'$  nightly ?  -use seroquel as back up prn ?  -spoke to Dr. Mickeal Skinner, will decrease keppra to '750mg'$  bid while  here and '500mg'$  bid after discharge.  ?  ?  ?5. Neuropsych: This patient is capable of making decisions on his own behalf. ?6. Skin/Wound Care: Routine skin checks ?            -dc staples today 3/3 ?7. Fluids/Electrolytes/Nutrition: encourage PO ? -3/5 prerenal azo ?  -pushing fluids ?  -recent BMET wnl, he's eating well ?3/6- BMP  stable ?8.  Seizure prophylaxis.  Keppra 1000 mg twice daily ?9.  History of CAD.  Plavix remains on hold.  No chest pain or shortness of breath.  Continue Imdur 30 mg daily and nitroglycerin as needed ?10.  Diabetes mellitus.  Hemoglobin A1c 9.6.  NovoLog 8 units 3 times daily, Semglee 28 units twice daily. ? CBG (last 3)  ?Recent Labs  ?  11/02/21 ?1646 11/02/21 ?2105 11/03/21 ?0632  ?GLUCAP 179* 277* 187*  ?  3/5 increased semglee to 30u bid 3/4 ? 3/7- CBGs under fair to poor- con't regimen ?-  SSI as we taper decadron ?11.  COPD.  Oxygen dependent 2 L prior to admission.  Continue nebulizers as directed ?12.  Hypertension.  Lopressor 100 mg twice daily ?  3/4 borderline to controlled ? 3/6- BP controlled in last 24 hours ?13. Urinary frequency: stll present ? -UA/UCX negative ? -PVR's 0 ? -continue trial of toviaz '4mg'$  ? -added pyridium for urgency ? ? ? ?LOS: ?7 days ?A FACE TO FACE EVALUATION WAS PERFORMED ? ?Meredith Staggers ?11/03/2021, 11:56 AM  ? ?  ?

## 2021-11-03 NOTE — Patient Care Conference (Signed)
Inpatient RehabilitationTeam Conference and Plan of Care Update ?Date: 11/03/2021   Time: 10:40 AM  ? ? ?Patient Name: Jeff Wood.      ?Medical Record Number: [id number]  ?Date of Birth: 10/13/1955 ?Sex: Male         ?Room/Bed: 5C08C/5C08C-01 ?Payor Info: Payor: MEDICARE / Plan: MEDICARE PART A AND B / Product Type: *No Product type* /   ? ?Admit Date/Time:  10/27/2021  3:24 PM ? ?Primary Diagnosis:  Brain tumor ([place]) ? ?Hospital Problems: Principal Problem: ?  Brain tumor [facility]) ?Active Problems: ?  Fluency disorder associated with underlying disease ? ? ? ?Expected Discharge Date: Expected Discharge Date: 11/06/21 ? ?Team Members Present: ?Physician leading conference: Dr. Alger Simons ?Social Worker Present: Loralee Pacas, LCSWA ?Nurse Present: Dorthula Nettles, RN ?PT Present: Tereasa Coop, PT ?OT Present: Willeen Cass, OT ?SLP Present: Weston Anna, SLP ?PPS Coordinator present : Gunnar Fusi, SLP ? ?   Current Status/Progress Goal Weekly Team Focus  ?Bowel/Bladder ? ? continent b/b  remain continent  toileet as needed   ?Swallow/Nutrition/ Hydration ? ? reg diet/thin liquids  sup A  tolerance of current diet   ?ADL's ? ? contact guard to supervision  supervision  fam education, functional mobility with and without AE, functional problem solving, endurance   ?Mobility ? ? CGA ambulation >150' with RW, supervision bed mobility and transfers  supervision  safety awareness, endurance, high level balance, ambulation, DC prep   ?Communication ? ? mod A  min A comprehension, min-to-mod A expression  communicating functional needs, object naming, word finding strategies, following 1-2 step commands   ?Safety/Cognition/ Behavioral Observations ? attention flucuates (very fatigued 3/6 requiring max A redirection, exhibited some confusion, decreased comprehension) generally requires min A verbal redirection, can be impulsive with getting out of bed/chair  min A  sustained attention, safety awareness    ?Pain ? ? no reported pain  remain pain free  assess pain q 4hr and prn   ?Skin ? ? incision to head  no new breakdown or infection while on CIR  assess skin q shift and prn   ? ? ?Discharge Planning:  ?D/c to home with support from his son in law and daughter, and supervision from wife.   ?Team Discussion: ?Left crani. Having headaches. Confused at night. Having sleep problems. BLE edema due to sleeping in recliner with legs down last night. Family wants him to come home. Wife would like to be able to take patient to the bathroom.  ? ?Patient on target to meet rehab goals: ?yes, supervision goals. Mod assist functional communication. Min assist cognition. ? ?*See Care Plan and progress notes for long and short-term goals.  ? ?Revisions to Treatment Plan:  ?Adjusting medications, finalizing discharge plans. ?  ?Teaching Needs: ?Family education, medication/pain management, safety awareness, skin/wound care, transfer/gait training, etc. ?  ?Current Barriers to Discharge: ?Wound care, Pending chemo/radiation, and Behavior ? ?Possible Resolutions to Barriers: ?Family education ?Follow-up PT/OT/SLP ?Order recommended DME ?  ? ? Medical Summary ?Current Status: gbm, resection. hs confusion, insomnia. frequency. palliative care involved, headaches ? Barriers to Discharge: Medical stability ?  ?Possible Resolutions to [employer]: daily assessment of sleep habits, sleep meds. headache control, neuro-onc plan ? ? ?Continued Need for Acute Rehabilitation Level of Care: The patient requires daily medical management by a physician with specialized training in physical medicine and rehabilitation for the following reasons: ?Direction of a multidisciplinary physical rehabilitation program to maximize functional independence : Yes ?Medical management of  patient stability for increased activity during participation in an intensive rehabilitation regime.: Yes ?Analysis of laboratory values and/or radiology reports with  any subsequent need for medication adjustment and/or medical intervention. : Yes ? ? ?I attest that I was present, lead the team conference, and concur with the assessment and plan of the team. ? ? ?Dorthula Nettles G ?11/03/2021, 2:33 PM  ? ? ? ? ? ? ?

## 2021-11-03 NOTE — Discharge Summary (Signed)
Physician Discharge Summary  Patient ID: Jeff Baranowski Sr. MRN: [id number] DOB/AGE: Jun 06, 1956 66 y.o.  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 EXAM: CT HEAD WITHOUT CONTRAST TECHNIQUE: Contiguous axial images were obtained from the base of the skull through the vertex without intravenous contrast. RADIATION DOSE REDUCTION: This exam was performed according to the departmental dose-optimization program which includes automated exposure control, adjustment of the mA and/or kV according to patient size and/or use of iterative reconstruction technique. COMPARISON:  October 27, 2021 FINDINGS: Brain: Stable postsurgical changes from posterior left temporal lobe resection containing some gas and fluid. The previously demonstrated adjacent parenchymal hemorrhage is not well seen on today's study. Regional hypoattenuation likely represents edema is not significantly changed. 4 mm midline shift to the right. Stable mass effect. The ventricular size is stable. Vascular: No hyperdense vessel or unexpected calcification. Skull: 6 left lateral craniotomy. Sinuses/Orbits: No acute finding. Other: Postsurgical changes in the left scalp. IMPRESSION: Stable postsurgical changes from posterior left temporal lobe resection containing some gas and fluid. The previously demonstrated adjacent parenchymal hemorrhage is not well seen on today's study. Stable mass effect and 4 mm midline shift to the right. Electronically Signed   By: Fidela Salisbury M.D.   On: 11/04/2021 18:44   CT HEAD WO  CONTRAST (5MM)  Result Date: 10/27/2021 CLINICAL DATA:  66 year old male postoperative day 7 right posterior temporal lobe craniotomy and tumor resection. Pathology pending. EXAM: CT HEAD WITHOUT CONTRAST TECHNIQUE: Contiguous axial images were obtained from the base of the skull through the vertex without intravenous contrast. RADIATION DOSE REDUCTION: This exam was performed according to the departmental dose-optimization program which includes automated exposure control, adjustment of the mA and/or kV according to patient size and/or use of iterative reconstruction technique. COMPARISON:  Postoperative head CT 10/21/2021 and earlier. FINDINGS: Brain: Posterior left temporal lobe resection cavity containing some gas and fluid has not significantly changed in size or configuration since 10/21/2021. Small round foci of regional hemorrhage are also stable (series 3, images 19 and 22, up to 13 mm individually). Regional white matter hypodensity appears mildly regressed. Trace left side subdural pneumocephalus persists. Stable trace rightward midline shift. Mild mass effect on the atrium of the left lateral ventricle is stable. No ventriculomegaly. Basilar cisterns remain patent. No new intracranial hemorrhage or evidence of cortically based acute infarction. Vascular: Mild Calcified atherosclerosis at the skull base. Skull: Stable left lateral craniotomy. No acute osseous abnormality identified. Sinuses/Orbits: Visualized paranasal sinuses and mastoids are stable and well aerated. Other: Evolving postoperative changes to the left scalp. Skin staples remain in place. Negative orbits soft tissues. IMPRESSION: 1. Essentially stable CT appearance of the posterior left temporal resection. Stable resection cavity with small volume adjacent parenchymal blood. Unchanged mild regional mass effect. 2. No new intracranial abnormality. Electronically Signed   By: Genevie Ann M.D.   On: 10/27/2021 04:50   CT HEAD WO CONTRAST  (5MM)  Result Date: 10/21/2021 CLINICAL DATA:  Brain neoplasm status post resection EXAM: CT HEAD WITHOUT CONTRAST TECHNIQUE: Contiguous axial images were obtained from the base of the skull through the vertex without intravenous contrast. RADIATION DOSE REDUCTION: This exam  was performed according to the departmental dose-optimization program which includes automated exposure control, adjustment of the mA and/or kV according to patient size and/or use of iterative reconstruction technique. COMPARISON:  Brain MRI 10/18/2021 FINDINGS: Brain: There has been interval left temporoparietal craniotomy for mass resection. The resection cavity in the temporal lobe measures approximately 3.9 cm TV x 1.7 cm AP x 2.1 cm cc. There is a small amount of blood products at the periphery of the cavity. An additional focus of intraparenchymal or subarachnoid blood is seen superior to the resection cavity measuring 1.5 cm by 1.1 cm. There is a small amount of extra-axial fluid, blood, and air overlying the resection site measuring up to approximately 3 mm in thickness in the coronal plane. A small amount of pneumocephalus is also seen overlying the left frontal lobe. There is mild edema surrounding the resection cavity and this additional focus of blood resulting in partial effacement of the left temporal horn and approximally 2 mm rightward midline shift. The remainder of the brain parenchyma is within normal limits. There is no evidence of acute territorial infarct. Vascular: No hyperdense vessel or unexpected calcification. Skull: Postsurgical changes as above. Sinuses/Orbits: The paranasal sinuses are clear. The globes and orbits are unremarkable. Other: There is expected postoperative soft tissue swelling with fluid and gas in the left scalp. IMPRESSION: 1. Postsurgical changes reflecting interval left temporal lobe mass resection with a small amount of blood products at the periphery of the cavity as well as intraparenchymal  versus subarachnoid hemorrhage superior to the resection cavity. Edema around the resection cavity and blood products results in partial effacement of the left temporal horn and trace rightward midline shift. 2. No evidence of acute territorial infarct. Electronically Signed   By: Valetta Mole M.D.   On: 10/21/2021 08:46   MR BRAIN WO CONTRAST  Result Date: 10/16/2021 CLINICAL DATA:  Altered mental status EXAM: MRI HEAD WITHOUT CONTRAST TECHNIQUE: Multiplanar, multiecho pulse sequences of the brain and surrounding structures were obtained without intravenous contrast. COMPARISON:  None. FINDINGS: Truncated examination due to patient mental status and inability to tolerate the full examination. Only diffusion-weighted imaging was obtained. There is a mass lesion of the left temporal lobe with associated edema. No midline shift. No other mass is visible on diffusion-weighted imaging. IMPRESSION: 1. Truncated examination due to patient mental status and inability to tolerate the full examination. 2. Mass lesion of the left temporal lobe with associated edema. When possible, a complete MRI with the administration of intravenous contrast is recommended. Electronically Signed   By: Ulyses Jarred M.D.   On: 10/16/2021 03:56   MR BRAIN W WO CONTRAST  Result Date: 10/18/2021 CLINICAL DATA:  Initial evaluation for brain/CNS neoplasm, staging. EXAM: MRI HEAD WITHOUT AND WITH CONTRAST TECHNIQUE: Multiplanar, multiecho pulse sequences of the brain and surrounding structures were obtained without and with intravenous contrast. CONTRAST:  35m GADAVIST GADOBUTROL 1 MMOL/ML IV SOLN COMPARISON:  Prior CTs from 10/15/2021. FINDINGS: Brain: Cerebral volume within normal limits. Mild scattered patchy T2/FLAIR hyperintensity noted involving the periventricular and deep white matter both cerebral hemispheres, nonspecific, but most like related chronic microvascular ischemic disease, overall mild in nature in felt to be within  normal limits for age. No evidence for acute or subacute infarct. No areas of chronic cortical infarction. No acute intracranial hemorrhage. Rounded mass positioned at the left temporal lobe measures 2.4 x 2.7 x 2.8 cm (AP by transverse by craniocaudad). Lesion demonstrates hyperintense T2 signal intensity with avid heterogeneous and  predominantly peripheral postcontrast enhancement. Speckled areas of internal susceptibility artifact consistent with necrosis and/or blood products. Surrounding T2/FLAIR signal intensity consistent with vasogenic edema and/or infiltrating tumor. Mild mass effect on the atrium of the left lateral ventricle without significant midline shift. 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 No other mass lesion or abnormal enhancement. No hydrocephalus or ventricular trapping. No extra-axial fluid collection. Pituitary gland suprasellar region normal. Midline structures normal. Vascular: Major intracranial vascular flow voids are maintained. Skull and upper cervical spine: Craniocervical junction within normal limits. Bone marrow signal intensity within normal limits. No focal marrow replacing lesion. No scalp soft tissue abnormality. Sinuses/Orbits: Globes and orbital soft tissues demonstrate no acute finding. Scattered mucosal thickening noted within the ethmoidal air cells. Fluid seen layering within the nasopharynx. Small bilateral mastoid effusions. Patient is intubated. Other: None. IMPRESSION: 1. 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 Surrounding vasogenic edema and/or infiltrating tumor without significant midline shift. 2. No other acute intracranial abnormality. Electronically Signed   By: Jeannine Boga M.D.   On: 10/18/2021 19:49   CT CEREBRAL PERFUSION W CONTRAST  Result Date: 10/15/2021 CLINICAL DATA:  Neuro deficit, acute, stroke suspected. EXAM:  CT ANGIOGRAPHY HEAD AND NECK CT PERFUSION BRAIN TECHNIQUE: Multidetector CT imaging of the head and neck was performed using the standard protocol during bolus administration of intravenous contrast. Multiplanar CT image reconstructions and MIPs were obtained to evaluate the vascular anatomy. Carotid stenosis measurements (when applicable) are obtained utilizing NASCET criteria, using the distal internal carotid diameter as the denominator. Multiphase CT imaging of the brain was performed following IV bolus contrast injection. Subsequent parametric perfusion maps were calculated using RAPID software. RADIATION DOSE REDUCTION: This exam was performed according to the departmental dose-optimization program which includes automated exposure control, adjustment of the mA and/or kV according to patient size and/or use of iterative reconstruction technique. CONTRAST:  Not annotated COMPARISON:  Head CT earlier same day FINDINGS: CTA NECK FINDINGS Aortic arch: Aortic atherosclerosis, mild. Right carotid system: Common carotid artery widely patent to the bifurcation. Carotid bifurcation is normal. Left carotid system: Common carotid artery widely patent to the bifurcation. Carotid bifurcation is normal. Vertebral arteries: Vertebral artery origins are poorly seen. Beyond the origins, the vessels are patent through the cervical region to the foramen magnum. Skeleton: Ordinary cervical spondylosis. Other neck: No mass or lymphadenopathy. Upper chest: Small amount of dependent pleural fluid and dependent pulmonary atelectasis. Review of the MIP images confirms the above findings CTA HEAD FINDINGS Anterior circulation: Both internal carotid arteries widely patent through the skull base and siphon regions. The anterior and middle cerebral vessels are patent. No large vessel occlusion. Posterior circulation: Both vertebral arteries are widely patent to the basilar. No basilar stenosis. No large vessel occlusion. Venous sinuses:  Patent and normal. Anatomic variants: None significant. Review of the MIP images confirms the above findings CT Brain Perfusion Findings: ASPECTS: 9 CBF (<30%) Volume: 69m Perfusion (Tmax>6.0s) volume: 036mMismatch Volume: 57m54mnfarction Location:None There is actual hyperemia in the left posterior temporal lobe, with a peripherally enhancing mass lesion visible, measuring about 2.5 cm in size. IMPRESSION: No large vessel occlusion. 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 Electronically Signed   By: MarNelson ChimesD.   On: 10/15/2021 22:35   DG CHEST PORT 1 VIEW  Result Date: 10/24/2021 CLINICAL DATA:  Cough EXAM: PORTABLE  CHEST 1 VIEW COMPARISON:  10/16/2021 FINDINGS: Transverse diameter of heart is increased. There is interval increase in interstitial markings in the left parahilar region and left lower lung fields. Right lung is unremarkable. There is poor inspiration. There is minimal blunting of left lateral CP angle. There is no pneumothorax. Tip of right IJ chest port is seen in the superior vena cava. IMPRESSION: Cardiomegaly. There is interval increase in interstitial markings in the left parahilar region and left lower lung fields suggesting possible interstitial pneumonia. Electronically Signed   By: Elmer Picker M.D.   On: 10/24/2021 10:38   DG CHEST PORT 1 VIEW  Result Date: 10/16/2021 CLINICAL DATA:  COVID-19 infection. EXAM: PORTABLE CHEST 1 VIEW COMPARISON:  None. FINDINGS: The heart is enlarged and the mediastinal contour is within normal limits. Lung volumes are low. No consolidation, effusion, or pneumothorax. A right chest port terminates over the superior vena cava. No acute osseous abnormality. IMPRESSION: 1. Low lung volumes with no acute process. 2. Cardiomegaly. Electronically Signed   By: Brett Fairy M.D.   On: 10/16/2021 04:10   EEG adult  Result Date: 10/16/2021 Greta Doom, MD     10/16/2021  1:46 PM History: 66  yo M with AMS with left temporal mass Sedation: none Technique: This EEG was acquired with electrodes placed according to the International 10-20 electrode system (including Fp1, Fp2, F3, F4, C3, C4, P3, P4, O1, O2, T3, T4, T5, T6, A1, A2, Fz, Cz, Pz). The following electrodes were missing or displaced: none. Background: There is a posterior dominant rhythm of 9 Hz. There is focal irregular delta activity maximal in the left temporal region. In addition, there are occasioanl left temporal sharp waves, T7, P7 > F7 Photic stimulation: Physiologic driving is not performed. EEG Abnormalities: 1) Left temporal sharp waves 2) Left temporal slow activity Clinical Interpretation: This EEG is consistent with an area of potential epileptogenicity in the left temporal region. There was no seizure recorded on this study. Please note that lack of epileptiform activity on EEG does not preclude the possibility of epilepsy. Roland Rack, MD [facility] [phone] If 7pm- 7am, please page neurology on call as listed in [place].   Overnight EEG with video  Result Date: 10/19/2021 Lora Havens, MD     10/19/2021  3:26 PM Patient Name: Lawarence Meek Sr. MRN: [id number] Epilepsy Attending: Lora Havens Referring Physician/Provider: Dr Su Monks Duration: 10/18/2021 2129 to 10/19/2021 1154 Patient history: 66 year old male with altered mental status and left temporal mass.  EEG to evaluate for seizure Level of alertness: Awake, asleep AEDs during EEG study: LEV Technical aspects: This EEG study was done with scalp electrodes positioned according to the 10-20 International system of electrode placement. Electrical activity was acquired at a sampling rate of '500Hz'$  and reviewed with a high frequency filter of '70Hz'$  and a low frequency filter of '1Hz'$ . EEG data were recorded continuously and digitally stored. Description: The posterior dominant rhythm consists of 9 Hz activity of moderate voltage (25-35 uV)  seen predominantly in posterior head regions, symmetric and reactive to eye opening and eye closing. Sleep was characterized by vertex waves, sleep spindles (12 to 14 Hz), maximal frontocentral region.  EEG showed intermittent 3 to 5 Hz theta-delta slowing in left temporal region which at times appears rhythmic without definite evolution consistent with lateralized rhythmic delta activity.  Intermittent generalized 3 to 5 Hz theta and delta slowing was also noted. Hyperventilation and photic stimulation were not performed.  ABNORMALITY -Lateralized rhythmic delta activity, left temporal region -Intermittent slow, generalized IMPRESSION: This study is suggestive of cortical dysfunction in left temporal region which is likely secondary to underlying mass.  Of note, lateralized rhythmic activity is on the ictal-interictal continuum with low potential for seizures.  Additionally there is mild to moderate diffuse encephalopathy, nonspecific etiology.  No definite seizures or epileptiform discharges were seen throughout the recording. Lora Havens   CT HEAD CODE STROKE WO CONTRAST  Result Date: 10/15/2021 CLINICAL DATA:  Code stroke. Neuro deficit, acute, stroke suspected. EXAM: CT HEAD WITHOUT CONTRAST TECHNIQUE: Contiguous axial images were obtained from the base of the skull through the vertex without intravenous contrast. RADIATION DOSE REDUCTION: This exam was performed according to the departmental dose-optimization program which includes automated exposure control, adjustment of the mA and/or kV according to patient size and/or use of iterative reconstruction technique. COMPARISON:  None. FINDINGS: Brain: The brainstem and cerebellum are normal. Right cerebral hemisphere is normal. There is a 3-4 cm region of abnormal low-density and mild swelling in the left mid to posterior temporal lobe. This could represent a early subacute infarction mass lesion is not excluded. There may be some internal petechial  blood products but there is no frank hematoma. Elsewhere the brain appears normal. No hydrocephalus or extra-axial collection. Vascular: No abnormal vascular finding. Skull: Negative Sinuses/Orbits: Clear/normal Other: None ASPECTS ([place] Stroke Program Early CT Score) - Ganglionic level infarction (caudate, lentiform nuclei, internal capsule, insula, M1-M3 cortex): 6 - Supraganglionic infarction (M4-M6 cortex): 3 Total score (0-10 with 10 being normal): 9 IMPRESSION: 1. Question 3-4 cm region of subacute infarction versus possibly a mass lesion in the left posterior temporal lobe. There may be some internal petechial blood products but there is no frank hematoma. 2. ASPECTS is 9 3. Case discussed by telephone with Dr. Lorrin Goodell at 2200 hours. Electronically Signed   By: Nelson Chimes M.D.   On: 10/15/2021 22:06   CT ANGIO HEAD NECK W WO CM (CODE STROKE)  Result Date: 10/15/2021 CLINICAL DATA:  Neuro deficit, acute, stroke suspected. EXAM: CT ANGIOGRAPHY HEAD AND NECK CT PERFUSION BRAIN TECHNIQUE: Multidetector CT imaging of the head and neck was performed using the standard protocol during bolus administration of intravenous contrast. Multiplanar CT image reconstructions and MIPs were obtained to evaluate the vascular anatomy. Carotid stenosis measurements (when applicable) are obtained utilizing NASCET criteria, using the distal internal carotid diameter as the denominator. Multiphase CT imaging of the brain was performed following IV bolus contrast injection. Subsequent parametric perfusion maps were calculated using RAPID software. RADIATION DOSE REDUCTION: This exam was performed according to the departmental dose-optimization program which includes automated exposure control, adjustment of the mA and/or kV according to patient size and/or use of iterative reconstruction technique. CONTRAST:  Not annotated COMPARISON:  Head CT earlier same day FINDINGS: CTA NECK FINDINGS Aortic arch: Aortic  atherosclerosis, mild. Right carotid system: Common carotid artery widely patent to the bifurcation. Carotid bifurcation is normal. Left carotid system: Common carotid artery widely patent to the bifurcation. Carotid bifurcation is normal. Vertebral arteries: Vertebral artery origins are poorly seen. Beyond the origins, the vessels are patent through the cervical region to the foramen magnum. Skeleton: Ordinary cervical spondylosis. Other neck: No mass or lymphadenopathy. Upper chest: Small amount of dependent pleural fluid and dependent pulmonary atelectasis. Review of the MIP images confirms the above findings CTA HEAD FINDINGS Anterior circulation: Both internal carotid arteries widely patent through the skull base and siphon regions. The anterior and middle  cerebral vessels are patent. No large vessel occlusion. Posterior circulation: Both vertebral arteries are widely patent to the basilar. No basilar stenosis. No large vessel occlusion. Venous sinuses: Patent and normal. Anatomic variants: None significant. Review of the MIP images confirms the above findings CT Brain Perfusion Findings: ASPECTS: 9 CBF (<30%) Volume: 54m Perfusion (Tmax>6.0s) volume: 094mMismatch Volume: 26m96mnfarction Location:None There is actual hyperemia in the left posterior temporal lobe, with a peripherally enhancing mass lesion visible, measuring about 2.5 cm in size. IMPRESSION: No large vessel occlusion. 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 Electronically Signed   By: MarNelson ChimesD.   On: 10/15/2021 22:35    Labs:  Basic Metabolic Panel: Recent Labs  Lab 10/30/21 0600 11/02/21 0801 11/04/21 0859  NA 137 138 134*  K 3.7 3.8 3.7  CL 104 105 102  CO2 '25 24 23  '$ GLUCOSE 94 187* 135*  BUN '19 18 23  '$ CREATININE 0.92 0.92 0.92  CALCIUM 8.9 8.9 8.9    CBC: Recent Labs  Lab 11/02/21 0801 11/04/21 0859  WBC 9.1 9.6  HGB 13.4 13.4  HCT 39.6 39.7  MCV 94.5 95.0  PLT 143*  163    CBG: Recent Labs  Lab 11/04/21 2129 11/05/21 0620 11/05/21 1122 11/05/21 1633 11/05/21 2106  GLUCAP 123* 105* 97 171* 224*   Family history.  Positive for hypertension as well as hyperlipidemia.  Denies any colon cancer esophageal cancer or rectal cancer  Brief HPI:   JohRevere Maahs. is a 65 78o. right-handed male with history of hyperlipidemia, COPD with 2 L oxygen, CAD on Plavix, DVT on Eliquis, hypertension type 2 diabetes mellitus.  Per chart review lives with spouse.  Modified independent prior to admission.  Presented 10/15/2021 with acute onset of aphasia.  Cranial CT scan question 3-4 cm region of subacute infarct versus possibly mass lesion in the left posterior temporal lobe.  CT angiogram head and neck no large vessel occlusion.  Hyperemic mass in the left posterior temporal lobe measuring 2.5 cm in size.  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  Admission chemistries unremarkable except glucose 230 urine drug screen negative hemoglobin A1c 9.5 lab test positive for detection of COVID-19 virus per wife received initial 2 doses of COVID-vaccine and 1 booster shot.  Chest x-ray without acute findings inflammatory markers were normal initially on precautions and discontinued.  Patient underwent left temporal craniotomy for tumor 10/20/2021 per Dr. PooAnnette StableMaintained on Decadron with slow taper.  EEG negative for seizures maintained on Keppra for seizure prophylaxis with plan for 500 mg BID on discharge per Dr VasMickeal SkinnerFollow-up cranial CT scan 10/27/2021 showed stable resection cavity with small volume adjacent parenchymal blood unchanged mild regional mass effect no new intracranial abnormality.  Pathology report was pending Dr. VasMickeal Skinnerr medical oncology follow-up.  Patient's chronic Eliquis currently remains on hold.  Therapy  evaluations completed due to patient decreased functional mobility was admitted for a comprehensive rehab program.   Hospital Course: JohPatricia Perales. was admitted to rehab 10/27/2021 for inpatient therapies to consist of PT, ST and OT at least three hours five days a week. Past admission physiatrist, therapy team and rehab RN have worked together to provide customized collaborative inpatient rehab.  Pertaining to patient's brain tumor pathology report showed grade 4  glioblastoma neurooncology follow-up.  Patient would also follow-up with neurosurgery.  Slow Decadron taper.  Palliative care was consulted to establish goals of care.  DVT prophylaxis and he was cleared to resume Eliquis.  Pain managed use of oxycodone as needed trial of Topamax for headaches.  Patient did have some delirium maintained on Seroquel changed to scheduled 200 mg nightly and trazodone discontinued.Marland Kitchen  He was maintained on low-dose Topamax for headaches.  All issues in regards to patient's restlessness were discussed at length with wife.  Bouts of constipation resolved with laxative assistance.  Chronic COPD oxygen dependent at home inhalers as directed monitoring of oxygen saturations.  He remained on Keppra for seizure prophylaxis EEG negative.  Blood sugars monitored with history of diabetes as well as while on Decadron insulin therapy as directed.  He did have some urinary frequency urine study negative he remained on Toviaz as well as completed course of Pyridium.  CAD Plavix on hold no chest pain or shortness of breath he did continue on Imdur.  Blood pressure monitored and continued beta-blocker   Blood pressures were monitored on TID basis and controlled  Diabetes has been monitored with ac/hs CBG checks and SSI was use prn for tighter BS control.    Rehab course: During patient's stay in rehab weekly team conferences were held to monitor patient's progress, set goals and discuss barriers to discharge. At admission,  patient required min mod assist 120 feet rolling walker minimal assist sit to stand  Physical exam.  Blood pressure 138/82 pulse 73 temperature 98.9 respirations 14 oxygen saturations 95% room air Constitutional.  No acute distress HEENT Head.  Craniotomy site clean and dry Eyes.  Pupils round and reactive to light no discharge.nystagmus Neck.  Supple nontender no JVD without thyromegaly Cardiac regular rate rhythm any extra sounds or murmur heard Abdomen.  Soft nontender positive bowel sounds without rebound Respiratory effort normal no respiratory distress without wheeze Neurologic.  Alert no acute distress makes eye contact with examiner.  He does track my finger bilaterally to peripheries.  Speech and processing were a bit delayed oriented to person Mathews day a month.  Moves all 4 limbs right seems to be more delayed than left.  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 He/She has had improvement in functional use RUE/LUE  and RLE/LLE as well as improvement in awareness.  Ambulated 200 feet contact-guard.  Sit to stand contact-guard.  Stand step transfers back to recliner with contact-guard.  Patient did require rest breaks for ADLs.  He can walk short distances to his bathroom supervision.  Needs some assist for lower body ADLs.  SLP facilitated naming object photograph of objects and black-and-white drawing of objects demonstrating 70% accuracy in each task with mild semantic errors.  Patient was able to name objects function giving actual object versus photos and drawing with 40% accuracy.  Full family teaching was completed plan discharged home       Disposition: Discharged to home    Diet: Carb modified  Special Instructions: No driving smoking or alcohol  Continue chronic oxygen as prior to admission  Continue to hold Plavix until follow-up  with neurosurgery  Medications on discharge. 1.  Tylenol as needed 2.  Eliquis 5 mg p.o. twice daily 3.  Decadron 2 mg BID 4.  Voltaren gel 2 g 4 times daily 5.  Toviaz 4 mg p.o. daily  6.  NovoLog 8 units 3 times daily with meals 7.  Lantus 30 units twice daily 8.  Imdur 30 mg p.o. daily 9.  Keppra 500 mg p.o. twice daily 10.  Lopressor 100 mg p.o. twice daily 11.  Singulair 10 mg p.o. nightly 12.  Nitroglycerin as needed 13.  Oxycodone 2 tablets every 6 hours as needed pain 14.  Pepcid 20 mg daily 15.  MiraLAX twice daily hold for loose stools 16.  Topamax 25 mg p.o. nightly 17.  Seroquel 200 mg QHS 18.  Lipitor 40 mg daily 19.  Lopid 600 mg twice daily 20.  Trilogy Ellipta 1 puff daily 21.  Ventolin inhaler 1 to 2 puffs every 6 hours as needed shortness of breath   30-35 minutes were spent completing discharge summary and discharge planning  Discharge Instructions     Ambulatory referral to Physical Medicine Rehab   Complete by: As directed    Moderate complexity follow-up 1 to 2 weeks brain tumor/glioblastoma        Follow-up Information     Meredith Staggers, MD Follow up.   Specialty: Physical Medicine and Rehabilitation Why: Office to call for appointment Contact information: [address] [place] [address] [phone]         Earnie Larsson, MD Follow up.   Specialty: Neurosurgery Why: Call for appointment Contact information: [address] [place] [address] [phone]         Ventura Sellers, MD Follow up.   Specialties: Psychiatry, Neurology, Oncology Why: follow up 11/09/2021 Contact information: [place] [address] [phone]                 Signed: Lavon Paganini Kidder 11/06/2021, 5:14 AM

## 2021-11-03 NOTE — Progress Notes (Signed)
Occupational Therapy Session Note ? ?Patient Details  ?Name: Jeff Cihlar Sr. ?MRN: [id number] ?Date of Birth: 03-24-1956 ? ?Today's Date: 11/03/2021 ?OT Individual Time: 2878-6767 ?OT Individual Time Calculation (min): 45 min  ? ? ?Short Term Goals: ?Week 1:  OT Short Term Goal 1 (Week 1): Patient will don overhead clothing with Min A. ?OT Short Term Goal 2 (Week 1): Patient will don LB clothing with Min A. ?OT Short Term Goal 3 (Week 1): Patient will complete 3/3 grooming tasks in standing with supervision A and no more than 2 verbal cues for sequencing. ?OT Short Term Goal 4 (Week 1): Patient will complete toilet transfers with CGA and LRAD. ?Week 2:    ? ?Skilled Therapeutic Interventions/Progress Updates:  ?  1:1 Pt received in the recliner and declined formal ADLs today - reporting he changed his clothing last night. Pt ambulated from room to the elevator (the central ones) with RW (due to feet discomfort) with supervision. Pt does ambulate at a very slow rate. When into the ADL apartment and practiced functional ambulation with and without RW with close supervision. Pt able to transition in and out of rocker/recliner with supervision. Also performed shower stall transfer with seat with contact guard. Ambulated to the gym without RW with contact guard. Engaged in function activity with frequent rest breaks due to decr activity tolerance. Pt performed UB exercises with 1.5 lb ball. Pt also engaged in modified OTAGO A exercises with frequent rest breaks. Pt ambulated back to the elevator and rode elevator up in standing. Pt asked to sit and ride back afterwards. Pt's expressive aphasia was worse with his increased fatigue at end of session. ? ?Discussed d/c planning and performance with tasks with wife. Wife receptive  ? ?Therapy Documentation ?Precautions:  ?Precautions ?Precautions: Fall ?Precaution Comments: aphasic ?Restrictions ?Weight Bearing Restrictions: No ? ?Pain: ?Reports bilateral feet are sensitive  and are swollen  ? ? ?Therapy/Group: Individual Therapy ? ?Nicoletta Ba ?11/03/2021, 10:57 AM ?

## 2021-11-04 ENCOUNTER — Inpatient Hospital Stay (HOSPITAL_COMMUNITY): Payer: Medicare Other

## 2021-11-04 LAB — GLUCOSE, CAPILLARY
Glucose-Capillary: 128 mg/dL — ABNORMAL HIGH (ref 70–99)
Glucose-Capillary: 87 mg/dL (ref 70–99)
Glucose-Capillary: 146 mg/dL — ABNORMAL HIGH (ref 70–99)
Glucose-Capillary: 123 mg/dL — ABNORMAL HIGH (ref 70–99)

## 2021-11-04 LAB — CBC
HCT: 39.7 % (ref 39.0–52.0)
Hemoglobin: 13.4 g/dL (ref 13.0–17.0)
MCH: 32.1 pg (ref 26.0–34.0)
MCHC: 33.8 g/dL (ref 30.0–36.0)
MCV: 95 fL (ref 80.0–100.0)
Platelets: 163 10*3/uL (ref 150–400)
RBC: 4.18 MIL/uL — ABNORMAL LOW (ref 4.22–5.81)
RDW: 13.6 % (ref 11.5–15.5)
WBC: 9.6 10*3/uL (ref 4.0–10.5)
nRBC: 0 % (ref 0.0–0.2)

## 2021-11-04 LAB — URINALYSIS, COMPLETE (UACMP) WITH MICROSCOPIC
Bilirubin Urine: NEGATIVE
Glucose, UA: NEGATIVE mg/dL
Hgb urine dipstick: NEGATIVE
Ketones, ur: NEGATIVE mg/dL
Leukocytes,Ua: NEGATIVE
Nitrite: POSITIVE — AB
Protein, ur: NEGATIVE mg/dL
Specific Gravity, Urine: 1.026 (ref 1.005–1.030)
pH: 5 (ref 5.0–8.0)

## 2021-11-04 LAB — BASIC METABOLIC PANEL
Anion gap: 9 (ref 5–15)
BUN: 23 mg/dL (ref 8–23)
CO2: 23 mmol/L (ref 22–32)
Calcium: 8.9 mg/dL (ref 8.9–10.3)
Chloride: 102 mmol/L (ref 98–111)
Creatinine, Ser: 0.92 mg/dL (ref 0.61–1.24)
GFR, Estimated: >60 mL/min (ref >60)
Glucose, Bld: 135 mg/dL — ABNORMAL HIGH (ref 70–99)
Potassium: 3.7 mmol/L (ref 3.5–5.1)
Sodium: 134 mmol/L — ABNORMAL LOW (ref 135–145)

## 2021-11-04 IMAGING — CT CT HEAD W/O CM
4 series · 16 of 47 positions shown, 18 images · non-contrast
Comparison: [DATE]

CLINICAL DATA: Mental status change.



[Series 3: head without · axial · non-contrast · 0.46mm/px · z∈[-780,-640]mm · 7 of 38 slices shown, 9 images]
[im 5/38  brain]
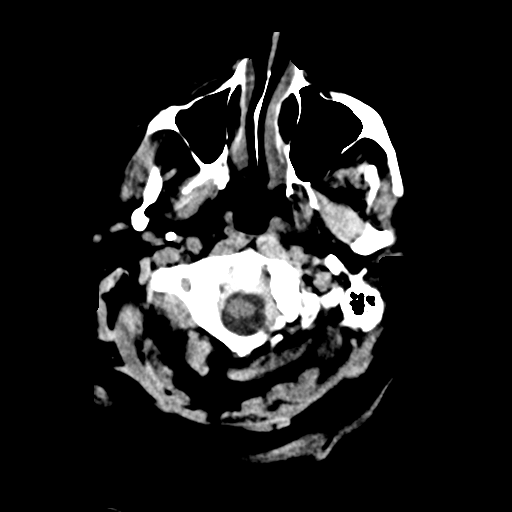
[im 5/38  bone]
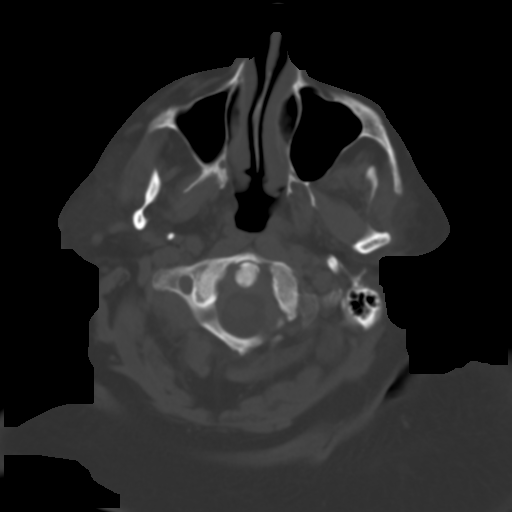
[im 10/38  brain]
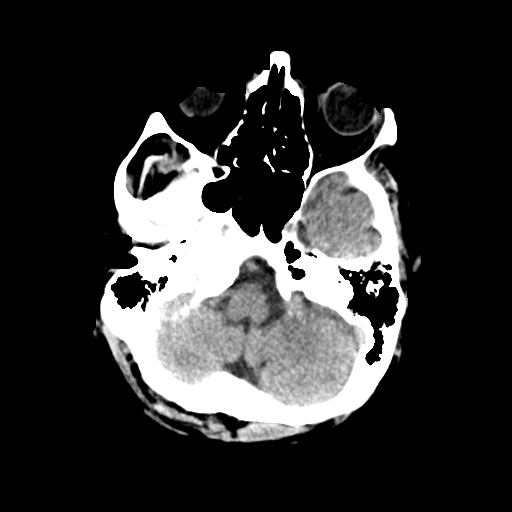
[im 14/38  brain]
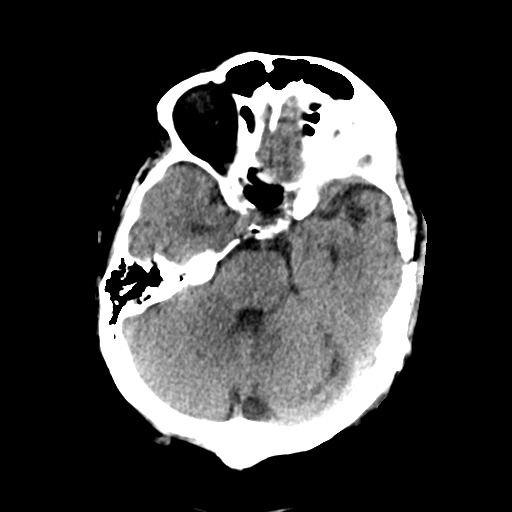
[im 19/38  brain]
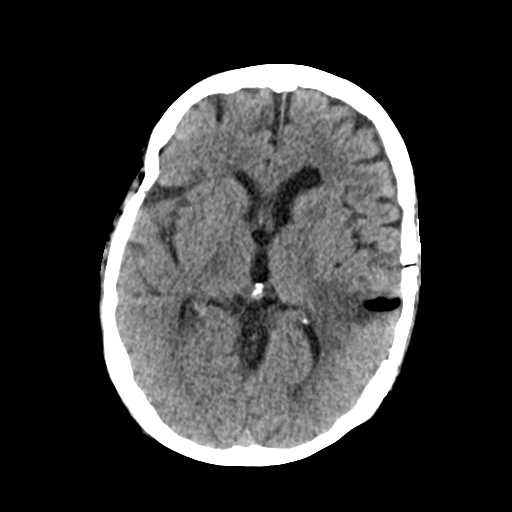
[im 24/38  brain]
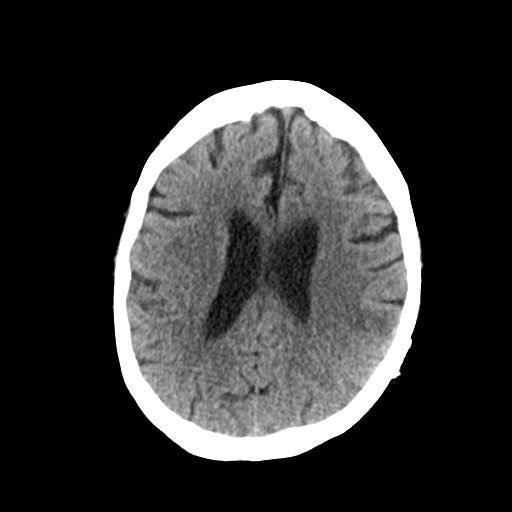
[im 24/38  bone]
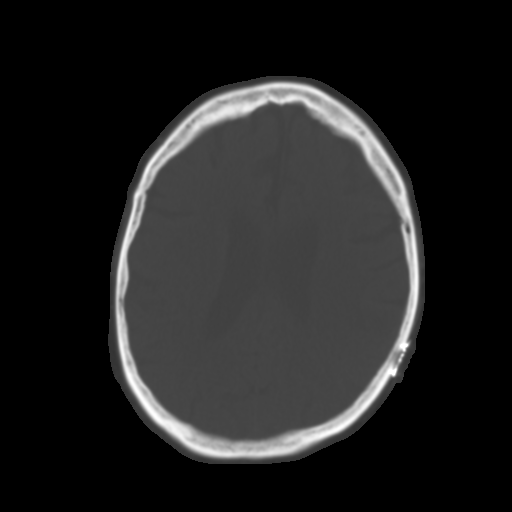
[im 28/38  brain]
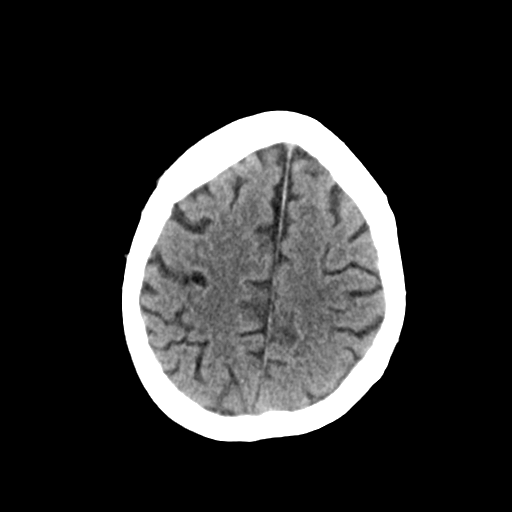
[im 33/38  brain]
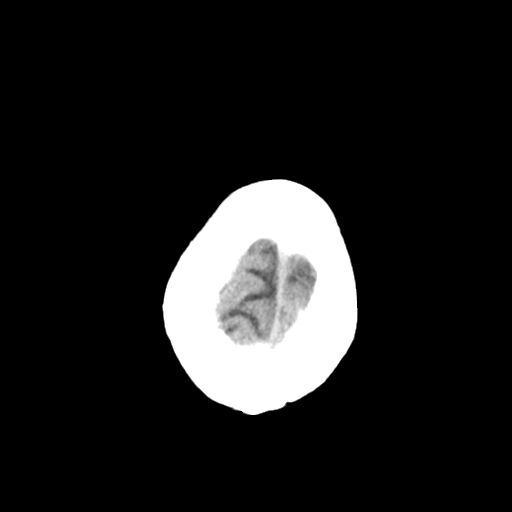

[Series 4: head bone · axial · 0.46mm/px · z∈[-782,-746]mm · 3 of 94 slices shown]
[im 10/94  bone]
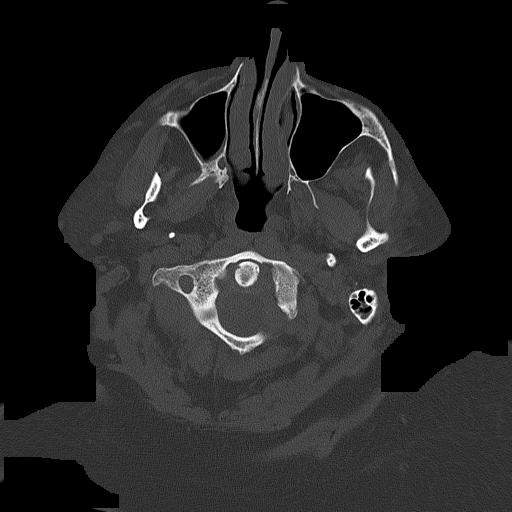
[im 19/94  bone]
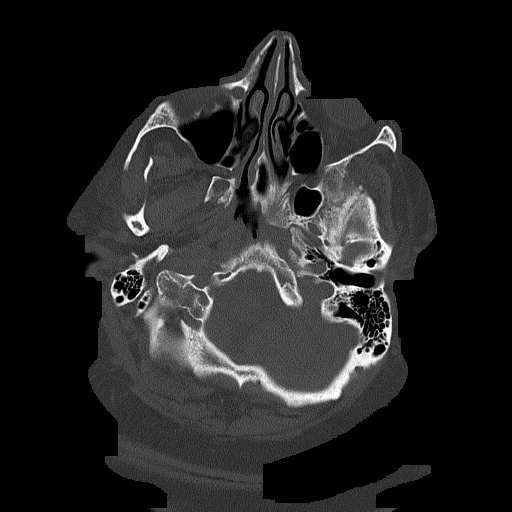
[im 28/94  bone]
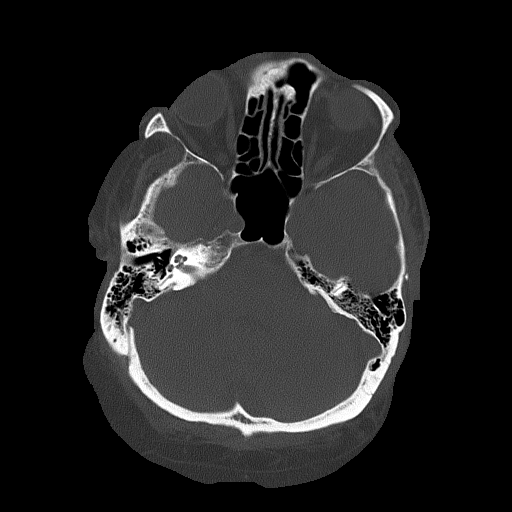

[Series 5: head without cor · coronal · non-contrast · 0.35mm/px · 3 of 75 slices shown]
[im 25/75  brain]
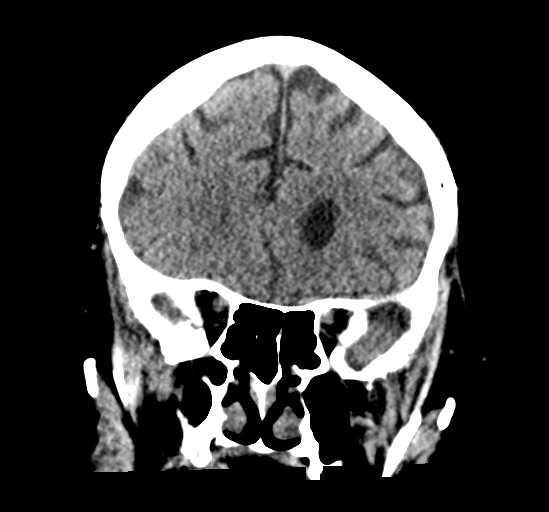
[im 33/75  brain]
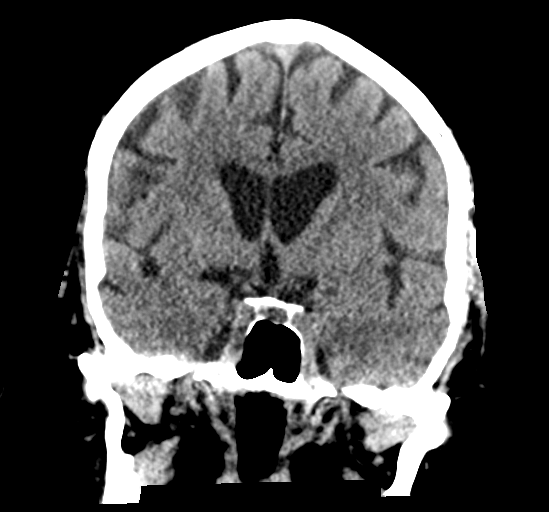
[im 42/75  brain]
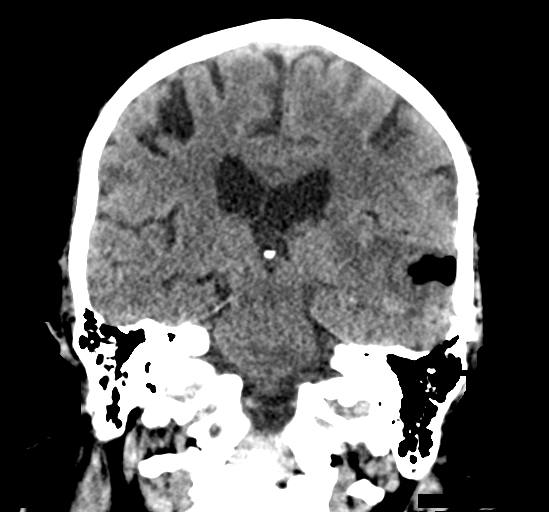

[Series 6: head without sag · sagittal · non-contrast · 0.36mm/px · 3 of 62 slices shown]
[im 21/62  brain]
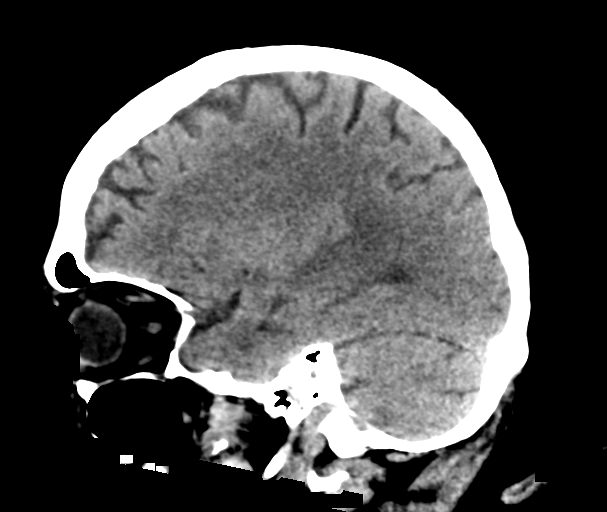
[im 31/62  brain]
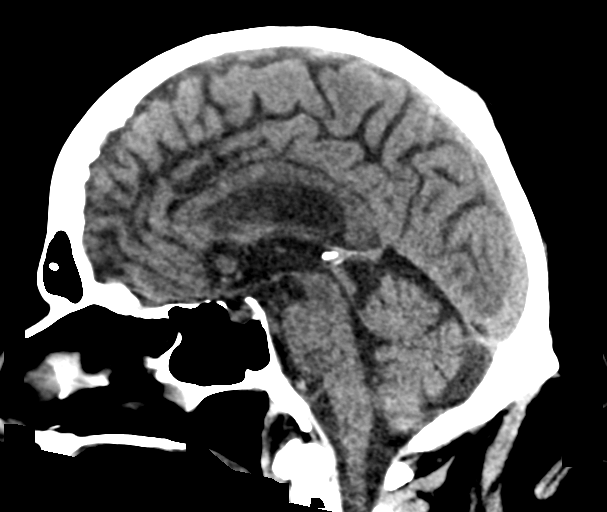
[im 41/62  brain]
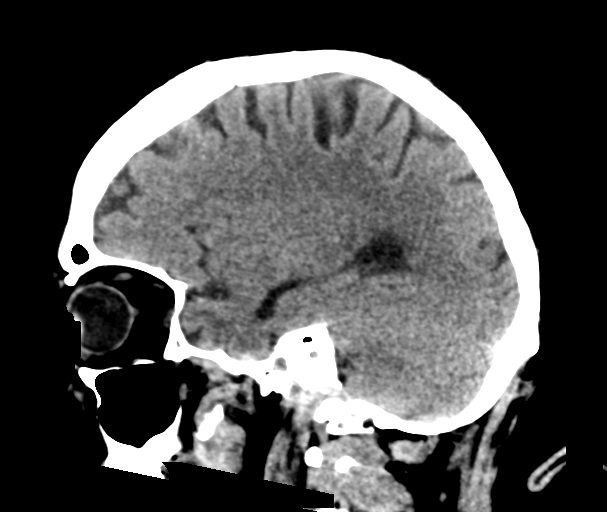

[16 of 47 positions shown; findings below may reference images not displayed]

FINDINGS: Brain: Stable postsurgical changes from posterior left temporal lobe
resection containing some gas and fluid. The previously demonstrated
adjacent parenchymal hemorrhage is not well seen on today's study.
Regional hypoattenuation likely represents edema is not
significantly changed. 4 mm midline shift to the right. Stable mass
effect. The ventricular size is stable.

Vascular: No hyperdense vessel or unexpected calcification.

Skull: 6 left lateral craniotomy.

Sinuses/Orbits: No acute finding.

Other: Postsurgical changes in the left scalp.
IMPRESSION: Stable postsurgical changes from posterior left temporal lobe
resection containing some gas and fluid. The previously demonstrated
adjacent parenchymal hemorrhage is not well seen on today's study.
Stable mass effect and 4 mm midline shift to the right.

## 2021-11-04 MED ORDER — CEPHALEXIN 250 MG PO CAPS
500.0000 mg | ORAL_CAPSULE | Freq: Two times a day (BID) | ORAL | Status: DC
  Administered 2021-11-04 – 2021-11-06 (×4): 500 mg via ORAL
  Filled 2021-11-04 (×4): qty 2

## 2021-11-04 NOTE — Progress Notes (Signed)
Urinalysis shows positive nitrite with culture pending.  Will empirically begin Keflex and await culture and sensitivities.  Cranial CT scan remains pending. ?

## 2021-11-04 NOTE — Progress Notes (Signed)
Occupational Therapy Session Note ? ?Patient Details  ?Name: Jeff Eckstein Sr. ?MRN: [id number] ?Date of Birth: 1956-06-08 ? ?Today's Date: 11/04/2021 ?OT Individual Time: 1660-6301 ?OT Individual Time Calculation (min): 25 min  ? ? ?Short Term Goals: ?Week 1:  OT Short Term Goal 1 (Week 1): Patient will don overhead clothing with Min A. ?OT Short Term Goal 2 (Week 1): Patient will don LB clothing with Min A. ?OT Short Term Goal 3 (Week 1): Patient will complete 3/3 grooming tasks in standing with supervision A and no more than 2 verbal cues for sequencing. ?OT Short Term Goal 4 (Week 1): Patient will complete toilet transfers with CGA and LRAD. ? ?Skilled Therapeutic Interventions/Progress Updates:  ?  Pt received sitting EOB with his daughter and wife present. Extensive discussion re plan to move room. Family with concerns re increased confusion and disorientation. Provided education on reasoning but listened to family concerns and conveyed them to Retail buyer. Pt requested to use the bathroom and required cueing to use the RW and for positioning. He completed an ambulatory transfer with CGA overall using the RW. He voided urine with increased time. Pt very lethargic throughout entire session- medical team aware and awaiting CT. Pt returned to EOB and required increased time and eventually assist to come supine so he could sleep. Pt left supine, PT entering room.  ? ?Therapy Documentation ?Precautions:  ?Precautions ?Precautions: Fall ?Precaution Comments: aphasic ?Restrictions ?Weight Bearing Restrictions: No ? ?Therapy/Group: Individual Therapy ? ?Curtis Sites ?11/04/2021, 6:29 AM ?

## 2021-11-04 NOTE — Progress Notes (Signed)
Physical Therapy Session Note ? ?Patient Details  ?Name: Jeff Provencal Sr. ?MRN: [id number] ?Date of Birth: 05/02/1956 ? ?Today's Date: 11/04/2021 ?PT Individual Time: 4193-7902 ?PT Individual Time Calculation (min): 55 min  ? ?Short Term Goals: ?Week 1:  PT Short Term Goal 1 (Week 1): Pt will complete bed moiblity consistently with CGA. ?PT Short Term Goal 2 (Week 1): Pt will complete bed to chair transfer consistently with CGA. ?PT Short Term Goal 3 (Week 1): Pt will ambulate x150' with CGA and LRAD. ?PT Short Term Goal 4 (Week 1): Pt will ascend/descend 2 steps with minA and LRAD. ? ?Skilled Therapeutic Interventions/Progress Updates:  ?   ?Pt received supine in bed. Initially it appears that pt is too fatigued to participate in therapy, but pt spontaneously sits up and begins attempting to get out of bed. Pt redirectable and pt eventually agreeable to therapy and does not complain of pain. Stand step transfer to [facility] with CGA. WC transport outside for time management. Pt performs gait training over unlevel and varying surfaces without AD. Pt ambulates 150', 200', and 75', with CGA and cues for upright gaze to improve posture and balance and increasing bilateral stride length to decrease risk for falls. WC transport back inside. Pt ambulates x75', completes x8 steps with bilateral hand rails and CGA, with cues for step sequencing, then takes seated rest break. Pt ambulates x75' back to [facility] with CGA. WC transport back to room. Stand pivot transfer to bed with cues for positioning. Pt performs sit to supine with verbal cues on sequencing and positioning. Left with alarm intact and all needs within reach. ? ?Therapy Documentation ?Precautions:  ?Precautions ?Precautions: Fall ?Precaution Comments: aphasic ?Restrictions ?Weight Bearing Restrictions: No ? ? ?Therapy/Group: Individual Therapy ? ?Breck Coons, PT, DPT ?11/04/2021, 3:56 PM  ?

## 2021-11-04 NOTE — Progress Notes (Signed)
SLP Cancellation Note ? ?Patient Details ?Name: Jeff Ausborn Sr. ?MRN: [id number] ?DOB: 04-27-1956 ? ? ?Cancelled treatment:       Patient missed 60 minutes of skilled SLP intervention due to fatigue. Patient awake most of night with intermittent agitation and is now sleeping. Will re-attempt as able when patient is awake and alert. ?                                              ?                                             ? ? ? ? ?Jeff Wood ?11/04/2021, 12:19 PM ?

## 2021-11-04 NOTE — Progress Notes (Signed)
Sent urine specimen to lab at this time. ?

## 2021-11-04 NOTE — Progress Notes (Signed)
Occupational Therapy Session Note ? ?Patient Details  ?Name: Jeff Amison Sr. ?MRN: [id number] ?Date of Birth: May 26, 1956 ? ?Today's Date: 11/04/2021 ?OT Individual Time: 0930-1000 ?OT Individual Time Calculation (min): 30 min  ? ? ?Short Term Goals: ?Week 1:  OT Short Term Goal 1 (Week 1): Patient will don overhead clothing with Min A. ?OT Short Term Goal 2 (Week 1): Patient will don LB clothing with Min A. ?OT Short Term Goal 3 (Week 1): Patient will complete 3/3 grooming tasks in standing with supervision A and no more than 2 verbal cues for sequencing. ?OT Short Term Goal 4 (Week 1): Patient will complete toilet transfers with CGA and LRAD. ?Week 2:    ?Week 3:    ? ?Skilled Therapeutic Interventions/Progress Updates:  ?  1:1 Pt standing at the sink with daughter when arrived. Pt visibly  very tired- closing his eyes. Pt ambulated to the bathroom and sat on the commode after agreeing to shower. When trying to assist with doffing shirt pt reported no and he didn't not want to shower but agreed to shaving at the sink. Pt required min cues to apply shaving cream to the face and shaving his cheeks. Pt required A to shave neck and bottom of face but reported it hurt too bad. Transitioned to shaving with electric razor. Pt kept eyes closed for most of task. Pt transitioned into bed with min cues for sequencing and was asleep before leaving his room. ? ?Pt with more difficulty expressing his wants and needs verbally compared to yesterday. ? ?Discussed with pt's wife and daughter about good days and bad days, picking and choosing what is more important and providing more supportive environment for him when frustrated with communication.    ? ?Therapy Documentation ?Precautions:  ?Precautions ?Precautions: Fall ?Precaution Comments: aphasic ?Restrictions ?Weight Bearing Restrictions: No ?General: ?General ?OT Amount of Missed Time: 15 Minutes due to fatigue  ? ? ?Pain: ? Pt very sensitive to the touch today- reporting  "ouch" to the slightest touch today - all over body  ? ? ?Therapy/Group: Individual Therapy ? ?Nicoletta Ba ?11/04/2021, 3:15 PM ?

## 2021-11-04 NOTE — Progress Notes (Signed)
?                                                       PROGRESS NOTE ? ? ?Subjective/Complaints: ?Had a bad night. Confused. Marcell Anger aggressive with staff ? ?ROS: Limited due to cognitive/behavioral  ? ? ?Objective: ?  ?No results found. ?Recent Labs  ?  11/02/21 ?0801  ?WBC 9.1  ?HGB 13.4  ?HCT 39.6  ?PLT 143*  ? ? ?Recent Labs  ?  11/02/21 ?0801  ?NA 138  ?K 3.8  ?CL 105  ?CO2 24  ?GLUCOSE 187*  ?BUN 18  ?CREATININE 0.92  ?CALCIUM 8.9  ? ? ? ?Intake/Output Summary (Last 24 hours) at 11/04/2021 0824 ?Last data filed at 11/03/2021 1253 ?Gross per 24 hour  ?Intake 480 ml  ?Output --  ?Net 480 ml  ?  ? ?  ? ?Physical Exam: ?Vital Signs ?Blood pressure (!) 141/67, pulse (!) 56, temperature 97.9 ?F (36.6 ?C), temperature source Oral, resp. rate 19, height '5\' 10"'$  (1.778 m), weight 115.8 kg, SpO2 97 %. ? ? ?Constitutional: No distress . Vital signs reviewed. ?HEENT: NCAT, EOMI, oral membranes moist ?Neck: supple ?Cardiovascular: RRR without murmur. No JVD    ?Respiratory/Chest: CTA Bilaterally without wheezes or rales. Normal effort    ?GI/Abdomen: BS +, non-tender, non-distended ?Ext: no clubbing, cyanosis, 2+ LE edema ?Psych: restless  ?Skin: Clean dry. Crani incisions clean/dry, staples out ?Neuro:  fatigued, word finding deficits. confused Follows commands.  No focal CN findings. 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 Moves all 4's however 3+ to 4/5. Decreased LT in stocking glove distribution in each lower leg and bilateral hands-chronic LE sensory deficits ?Musculoskeletal: no focal tenderness.   ? ? ?Assessment/Plan: ?1. Functional deficits which require 3+ hours per day of interdisciplinary therapy in a comprehensive inpatient rehab setting. ?Physiatrist is providing close team supervision and 24 hour management of active medical problems listed below. ?Physiatrist and rehab team continue to assess barriers to discharge/monitor patient progress toward functional and medical goals ? ?Care Tool: ? ?Bathing ?   ?Body  parts bathed by patient: Right arm, Left arm, Chest, Abdomen, Buttocks, Right upper leg, Left upper leg, Left lower leg, Face, Right lower leg, Front perineal area  ? Body parts bathed by helper: Buttocks, Right lower leg, Left lower leg ?  ?  ?Bathing assist Assist Level: Minimal Assistance - Patient > 75% ?  ?  ?Upper Body Dressing/Undressing ?Upper body dressing   ?What is the patient wearing?: Pull over shirt ?   ?Upper body assist Assist Level: Moderate Assistance - Patient 50 - 74% (mod assist to doff tight fitting sweatshirt) ?   ?Lower Body Dressing/Undressing ?Lower body dressing ? ? ?   ?What is the patient wearing?: Pants, Underwear/pull up ? ?  ? ?Lower body assist Assist for lower body dressing: Minimal Assistance - Patient > 75% ?   ? ?Toileting ?Toileting Toileting Activity did not occur (Probation officer and hygiene only): N/A (no void or bm)  ?Toileting assist Assist for toileting: Minimal Assistance - Patient > 75% ?  ?  ?Transfers ?Chair/bed transfer ? ?Transfers assist ?   ? ?Chair/bed transfer assist level: Moderate Assistance - Patient 50 - 74% ?  ?  ?Locomotion ?Ambulation ? ? ?Ambulation assist ? ?   ? ?Assist level: Contact Guard/Touching assist ?Assistive  device: Walker-rolling ?Max distance: 205  ? ?Walk 10 feet activity ? ? ?Assist ?   ? ?Assist level: Contact Guard/Touching assist ?Assistive device: Walker-rolling  ? ?Walk 50 feet activity ? ? ?Assist   ? ?Assist level: Contact Guard/Touching assist ?Assistive device: Walker-rolling  ? ? ?Walk 150 feet activity ? ? ?Assist Walk 150 feet activity did not occur: Safety/medical concerns ? ?Assist level: Contact Guard/Touching assist ?Assistive device: Walker-rolling ?  ? ?Walk 10 feet on uneven surface  ?activity ? ? ?Assist   ? ? ?Assist level: Minimal Assistance - Patient > 75% ?   ? ?Wheelchair ? ? ? ? ?Assist Is the patient using a wheelchair?: No ?  ?  ? ?  ?   ? ? ?Wheelchair 50 feet with 2 turns activity ? ? ? ?Assist ? ?  ?   ? ? ?   ? ?Wheelchair 150 feet activity  ? ? ? ?Assist ?   ? ? ?   ? ?Blood pressure (!) 141/67, pulse (!) 56, temperature 97.9 ?F (36.6 ?C), temperature source Oral, resp. rate 19, height '5\' 10"'$  (1.778 m), weight 115.8 kg, SpO2 97 %. ? ?Medical Problem List and Plan: ?1. 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  Status post right craniotomy resection of tumor worrisome for glioblastoma 10/20/2021.  ?            -patient may shower ?            -ELOS/Goals: 10-12 days, supervision to min assist goals ? --Continue CIR therapies including PT, OT, and SLP  ?-day pass granted for family visits ? -path + for Grade 4 GBM, neuro-onc f/u, steroid taper ? -appreciate palliative care input.   ? -Dr. Mickeal Skinner to see 3/13 in office to discuss further plan ? -3/8 ongoing confusion, ?worsening ?  -check CT head today ?  -repeat ua, ucx, CBC, BMET ?2.  Antithrombotics: ?-DVT/anticoagulation: Awaiting plan from neurosurgery when chronic Eliquis can be resumed. ?            -antiplatelet therapy: N/A ?3. Pain Management/headaches: Oxycodone as needed ? -resume topamax as h/a's recurred off medication ?4. Mood/sleep disorder/HS confusion:   ?            -antipsychotic agents: seroquel ?3/7 -suspect this is multifactorial (GBM, bladder, steroids and SE of meds)  ?   -have spoken to Mrs Mcartor at length re: possible etiologies ?  -continue toviaz  for bladder urgency, dc pyridium ?-resume topamax at HS ?  -will try his home dose of trazodone '200mg'$  '@2100'$  nightly ?  -use seroquel as back up prn ?  - decrease keppra to '750mg'$  bid while here and '500mg'$  bid after discharge.  ? -see #1 ?  ?5. Neuropsych: This patient is capable of making decisions on his own behalf. ?6. Skin/Wound Care: Routine skin checks ?            -dc staples today 3/3 ?7. Fluids/Electrolytes/Nutrition: encourage PO ? -3/5 prerenal azo ?  -pushing fluids ?  -recent BMET wnl, he's eating well ?Repeat labs 3/8 ?8.  Seizure prophylaxis.  Keppra 1000 mg twice  daily ?9.  History of CAD.  Plavix remains on hold.  No chest pain or shortness of breath.  Continue Imdur 30 mg daily and nitroglycerin as needed ?10.  Diabetes mellitus.  Hemoglobin A1c 9.6.  NovoLog 8 units 3 times daily, Semglee 28 units twice daily. ? CBG (last 3)  ?Recent Labs  ?  11/03/21 ?1651 11/03/21 ?  2141 11/04/21 ?0603  ?GLUCAP 200* 218* 128*  ?  3/5 increased semglee to 30u bid 3/4 ? 3/8- CBGs elevated, cover with ssi for now ?-  SSI as we taper decadron ?11.  COPD.  Oxygen dependent 2 L prior to admission.  Continue nebulizers as directed ?12.  Hypertension.  Lopressor 100 mg twice daily ?  3/4 borderline to controlled ? 3/6- BP controlled in last 24 hours ?13. Urinary frequency: stll present ? -UA/UCX negative ? -PVR's 0 ? -continue trial of toviaz '4mg'$  ? -dc pyridium  ? ? ? ?LOS: ?8 days ?A FACE TO FACE EVALUATION WAS PERFORMED ? ?Meredith Staggers ?11/04/2021, 8:24 AM  ? ?  ?

## 2021-11-04 NOTE — Progress Notes (Signed)
Patients wife called out to room stating that patient is up out his recliner and wandering the room. This RN and NT, Arbie Cookey, at patient's bedside. Patient found standing beside bed and refused to get back into recliner. RN attempted to redirect patient to recliner and patient not easily redirectable and reoriented. Patient alert and oriented to self. Patient begins to yell and verbally abuse his wife. Growing more agitated, patient begin to charge towards the door and demanded to leave. Patient was pushing against and grabbing on staff. Duress button activated. Security called to patient's room. Patient was able to calm down and be directed back to recliner. Wife at beside. Chair alarm activated.  ? ?Alegra Rost I, RN  ?

## 2021-11-04 NOTE — Progress Notes (Signed)
Patient ID: Jeff Wood., male   DOB: 1956-05-21, 66 y.o.   MRN: [id number] ? ?SW met with pt, pt wife, and pt dtr Jeff Wood to provide updates from team conference, and d/c on 3/10. Agreeable to [facility] for all three therapies and SW will order RW. ? ?Loralee Pacas, MSW, LCSWA ?Office: [phone] ?Cell: [phone] ?Fax: [phone]  ?

## 2021-11-05 ENCOUNTER — Ambulatory Visit: Payer: Self-pay | Admitting: Family Medicine

## 2021-11-05 ENCOUNTER — Other Ambulatory Visit (HOSPITAL_COMMUNITY): Payer: Self-pay

## 2021-11-05 DIAGNOSIS — C719 Malignant neoplasm of brain, unspecified: Secondary | ICD-10-CM

## 2021-11-05 LAB — GLUCOSE, CAPILLARY
Glucose-Capillary: 105 mg/dL — ABNORMAL HIGH (ref 70–99)
Glucose-Capillary: 97 mg/dL (ref 70–99)
Glucose-Capillary: 171 mg/dL — ABNORMAL HIGH (ref 70–99)
Glucose-Capillary: 224 mg/dL — ABNORMAL HIGH (ref 70–99)

## 2021-11-05 LAB — URINE CULTURE: Culture: <10000 — AB

## 2021-11-05 MED ORDER — ISOSORBIDE MONONITRATE ER 30 MG PO TB24
30.0000 mg | ORAL_TABLET | Freq: Every day | ORAL | 0 refills | Status: AC
  Filled 2021-11-05: qty 30, 30d supply, fill #0

## 2021-11-05 MED ORDER — DICLOFENAC SODIUM 1 % EX GEL
2.0000 g | Freq: Four times a day (QID) | CUTANEOUS | 0 refills | Status: AC
  Filled 2021-11-05: qty 200, 20d supply, fill #0

## 2021-11-05 MED ORDER — PEN NEEDLES 32G X 4 MM MISC
0 refills | Status: AC
  Filled 2021-11-05: qty 100, 30d supply, fill #0

## 2021-11-05 MED ORDER — APIXABAN 5 MG PO TABS
5.0000 mg | ORAL_TABLET | Freq: Two times a day (BID) | ORAL | 0 refills | Status: AC
  Filled 2021-11-05: qty 60, 30d supply, fill #0

## 2021-11-05 MED ORDER — ATORVASTATIN CALCIUM 40 MG PO TABS
40.0000 mg | ORAL_TABLET | Freq: Every day | ORAL | 0 refills | Status: AC
  Filled 2021-11-05: qty 30, 30d supply, fill #0

## 2021-11-05 MED ORDER — OXYCODONE-ACETAMINOPHEN 5-325 MG PO TABS
2.0000 | ORAL_TABLET | Freq: Four times a day (QID) | ORAL | 0 refills | Status: AC | PRN
  Filled 2021-11-05: qty 30, 4d supply, fill #0

## 2021-11-05 MED ORDER — INSULIN GLARGINE 100 UNIT/ML SOLOSTAR PEN
30.0000 [IU] | PEN_INJECTOR | Freq: Two times a day (BID) | SUBCUTANEOUS | 11 refills | Status: AC
  Filled 2021-11-05: qty 15, 25d supply, fill #0

## 2021-11-05 MED ORDER — ALBUTEROL SULFATE HFA 108 (90 BASE) MCG/ACT IN AERS
1.0000 | INHALATION_SPRAY | Freq: Four times a day (QID) | RESPIRATORY_TRACT | 0 refills | Status: AC | PRN
  Filled 2021-11-05: qty 8.5, 25d supply, fill #0

## 2021-11-05 MED ORDER — FAMOTIDINE 20 MG PO TABS
20.0000 mg | ORAL_TABLET | Freq: Every day | ORAL | 0 refills | Status: AC
  Filled 2021-11-05: qty 30, 30d supply, fill #0

## 2021-11-05 MED ORDER — GEMFIBROZIL 600 MG PO TABS
600.0000 mg | ORAL_TABLET | Freq: Two times a day (BID) | ORAL | 0 refills | Status: AC
  Filled 2021-11-05: qty 60, 30d supply, fill #0

## 2021-11-05 MED ORDER — ACETAMINOPHEN 325 MG PO TABS: 650.0000 mg | ORAL_TABLET | Freq: Four times a day (QID) | ORAL | Status: AC | PRN

## 2021-11-05 MED ORDER — DEXAMETHASONE 2 MG PO TABS
2.0000 mg | ORAL_TABLET | Freq: Two times a day (BID) | ORAL | 0 refills | Status: DC
Start: 2021-11-05 — End: 2021-11-09
  Filled 2021-11-05: qty 60, 30d supply, fill #0

## 2021-11-05 MED ORDER — QUETIAPINE FUMARATE 50 MG PO TABS
50.0000 mg | ORAL_TABLET | Freq: Every evening | ORAL | Status: DC | PRN
  Administered 2021-11-06: 50 mg via ORAL
  Filled 2021-11-05: qty 1

## 2021-11-05 MED ORDER — METOPROLOL TARTRATE 100 MG PO TABS
100.0000 mg | ORAL_TABLET | Freq: Two times a day (BID) | ORAL | 0 refills | Status: AC
  Filled 2021-11-05: qty 60, 30d supply, fill #0

## 2021-11-05 MED ORDER — QUETIAPINE FUMARATE 50 MG PO TABS
150.0000 mg | ORAL_TABLET | Freq: Every day | ORAL | Status: DC
  Administered 2021-11-05: 21:00:00 150 mg via ORAL
  Filled 2021-11-05: qty 3

## 2021-11-05 MED ORDER — POLYETHYLENE GLYCOL 3350 17 G PO PACK: 17.0000 g | PACK | Freq: Two times a day (BID) | ORAL | 0 refills | Status: DC

## 2021-11-05 MED ORDER — FESOTERODINE FUMARATE ER 4 MG PO TB24
4.0000 mg | ORAL_TABLET | Freq: Every day | ORAL | 0 refills | Status: AC
  Filled 2021-11-05: qty 30, 30d supply, fill #0

## 2021-11-05 MED ORDER — NOVOLOG FLEXPEN 100 UNIT/ML ~~LOC~~ SOPN
8.0000 [IU] | PEN_INJECTOR | Freq: Three times a day (TID) | SUBCUTANEOUS | 11 refills | Status: AC
Start: 2021-11-05 — End: ?
  Filled 2021-11-05: qty 9, 30d supply, fill #0

## 2021-11-05 MED ORDER — MONTELUKAST SODIUM 10 MG PO TABS
10.0000 mg | ORAL_TABLET | Freq: Every evening | ORAL | 0 refills | Status: AC
Start: 2021-11-05 — End: ?
  Filled 2021-11-05: qty 30, 30d supply, fill #0

## 2021-11-05 MED ORDER — QUETIAPINE FUMARATE 50 MG PO TABS
150.0000 mg | ORAL_TABLET | Freq: Every day | ORAL | 0 refills | Status: DC
  Filled 2021-11-05: qty 90, 30d supply, fill #0

## 2021-11-05 MED ORDER — CEPHALEXIN 500 MG PO CAPS
500.0000 mg | ORAL_CAPSULE | Freq: Two times a day (BID) | ORAL | 0 refills | Status: DC
Start: 2021-11-05 — End: 2021-11-09
  Filled 2021-11-05: qty 6, 3d supply, fill #0

## 2021-11-05 MED ORDER — TOPIRAMATE 25 MG PO TABS
25.0000 mg | ORAL_TABLET | Freq: Every day | ORAL | 0 refills | Status: AC
  Filled 2021-11-05: qty 30, 30d supply, fill #0

## 2021-11-05 MED ORDER — LEVETIRACETAM 500 MG PO TABS
500.0000 mg | ORAL_TABLET | Freq: Two times a day (BID) | ORAL | 0 refills | Status: DC
  Filled 2021-11-05: qty 60, 30d supply, fill #0

## 2021-11-05 NOTE — Progress Notes (Addendum)
Inpatient Rehabilitation Discharge Medication Review by a Pharmacist ? ?A complete drug regimen review was completed for this patient to identify any potential clinically significant medication issues. ? ?High Risk Drug Classes Is patient taking? Indication by Medication  ?Antipsychotic Yes Seroquel for agitation  ?Anticoagulant Yes Eliquis for DVT  ?Antibiotic Yes Po Keflex for UTI  ?Opioid Yes Prn oxycodone for pain  ?Antiplatelet No   ?Hypoglycemics/insulin Yes Novolog for DM  ?Vasoactive Medication Yes Lopressor for HTN ?Imdur for angina  ?Chemotherapy No   ?Other Yes Toviaz for urinary frequency ?Keppra for seizure ppx ?Singulair for allergies ?Topamax, Trazodone for sleep, mood ?Decadron taper for tumor ?Protonix for GERD ?Lopid for HLD  ? ? ? ?Type of Medication Issue Identified Description of Issue Recommendation(s)  ?Drug Interaction(s) (clinically significant) ?    ?Duplicate Therapy ?    ?Allergy ?    ?No Medication Administration End Date ?    ?Incorrect Dose ?    ?Additional Drug Therapy Needed ?    ?Significant med changes from prior encounter (inform family/care partners about these prior to discharge).    ?Other ?    ? ? ?Clinically significant medication issues were identified that warrant physician communication and completion of prescribed/recommended actions by midnight of the next day:  No ? ? ?Pharmacist comments: None ? ?Time spent performing this drug regimen review (minutes):  20 minutes ? ? ?Tad Moore ?11/05/2021 8:52 AM ?

## 2021-11-05 NOTE — Plan of Care (Signed)
?  Problem: RH Comprehension Communication ?Goal: LTG Patient will comprehend basic/complex auditory (SLP) ?Description: LTG: Patient will comprehend basic/complex auditory information with cues (SLP). ?Outcome: Adequate for Discharge ?  ?Problem: RH Expression Communication ?Goal: LTG Patient will express needs/wants via multi-modal(SLP) ?Description: LTG:  Patient will express needs/wants via multi-modal communication (gestures/written, etc) with cues (SLP) ?Outcome: Adequate for Discharge ?Goal: LTG Patient will increase word finding of common (SLP) ?Description: LTG:  Patient will increase word finding of common objects/daily info/abstract thoughts with cues using compensatory strategies (SLP). ?Outcome: Adequate for Discharge ?  ?Problem: RH Attention ?Goal: LTG Patient will demonstrate this level of attention during functional activites (SLP) ?Description: LTG:  Patient will will demonstrate this level of attention during functional activites (SLP) ?Outcome: Adequate for Discharge ?  ?Problem: RH Swallowing ?Goal: LTG Patient will consume least restrictive diet using compensatory strategies with assistance (SLP) ?Description: LTG:  Patient will consume least restrictive diet using compensatory strategies with assistance (SLP) ?Outcome: Completed/Met ?  ?

## 2021-11-05 NOTE — Progress Notes (Addendum)
PROGRESS NOTE   Subjective/Complaints: Up and down again last night often to bathroom. Restless.   ROS: Limited due to cognitive/behavioral   Objective:   CT HEAD WO CONTRAST (5MM)  Result Date: 11/04/2021 CLINICAL DATA:  Mental status change. EXAM: CT HEAD WITHOUT CONTRAST TECHNIQUE: Contiguous axial images were obtained from the base of the skull through the vertex without intravenous contrast. RADIATION DOSE REDUCTION: This exam was performed according to the departmental dose-optimization program which includes automated exposure control, adjustment of the mA and/or kV according to patient size and/or use of iterative reconstruction technique. COMPARISON:  October 27, 2021 FINDINGS: Brain: Stable postsurgical changes from posterior left temporal lobe resection containing some gas and fluid. The previously demonstrated adjacent parenchymal hemorrhage is not well seen on today's study. Regional hypoattenuation likely represents edema is not significantly changed. 4 mm midline shift to the right. Stable mass effect. The ventricular size is stable. Vascular: No hyperdense vessel or unexpected calcification. Skull: 6 left lateral craniotomy. Sinuses/Orbits: No acute finding. Other: Postsurgical changes in the left scalp. IMPRESSION: Stable postsurgical changes from posterior left temporal lobe resection containing some gas and fluid. The previously demonstrated adjacent parenchymal hemorrhage is not well seen on today's study. Stable mass effect and 4 mm midline shift to the right. Electronically Signed   By: Fidela Salisbury M.D.   On: 11/04/2021 18:44   Recent Labs    11/04/21 0859  WBC 9.6  HGB 13.4  HCT 39.7  PLT 163    Recent Labs    11/04/21 0859  NA 134*  K 3.7  CL 102  CO2 23  GLUCOSE 135*  BUN 23  CREATININE 0.92  CALCIUM 8.9     Intake/Output Summary (Last 24 hours) at 11/05/2021 0929 Last data filed at 11/05/2021  0700 Gross per 24 hour  Intake 960 ml  Output 325 ml  Net 635 ml        Physical Exam: Vital Signs Blood pressure (!) 174/90, pulse (!) 55, temperature 97.6 F (36.4 C), resp. rate 18, height '5\' 10"'$  (1.778 m), weight 115.8 kg, SpO2 98 %.   Constitutional: No distress . Vital signs reviewed. HEENT: NCAT, EOMI, oral membranes moist Neck: supple Cardiovascular: RRR without murmur. No JVD    Respiratory/Chest: CTA Bilaterally without wheezes or rales. Normal effort    GI/Abdomen: BS +, non-tender, non-distended Ext: no clubbing, cyanosis, 2++ LE edema, wears compression socks Psych: flat,does engage, a little distracted Skin: Clean dry. Crani incisions clean/dry, staples out Neuro:  fatigued, word finding deficits. confused Follows commands.  No focal CN findings. 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 Moves all 4's however 3+ to 4/5. Decreased LT in stocking glove distribution in each lower leg and bilateral hands-chronic LE sensory deficits Musculoskeletal: no focal tenderness.     Assessment/Plan: 1. Functional deficits which require 3+ hours per day of interdisciplinary therapy in a comprehensive inpatient rehab setting. Physiatrist is providing close team supervision and 24 hour management of active medical problems listed below. Physiatrist and rehab team continue to assess barriers to discharge/monitor patient progress toward functional and medical goals  Care Tool:  Bathing    Body parts bathed by patient: Right arm, Left  arm, Chest, Abdomen, Buttocks, Right upper leg, Left upper leg, Left lower leg, Face, Right lower leg, Front perineal area   Body parts bathed by helper: Buttocks, Right lower leg, Left lower leg     Bathing assist Assist Level: Minimal Assistance - Patient > 75%     Upper Body Dressing/Undressing Upper body dressing   What is the patient wearing?: Pull over shirt    Upper body assist Assist Level: Moderate Assistance - Patient 50 - 74% (mod assist  to doff tight fitting sweatshirt)    Lower Body Dressing/Undressing Lower body dressing      What is the patient wearing?: Pants, Underwear/pull up     Lower body assist Assist for lower body dressing: Minimal Assistance - Patient > 75%     Toileting Toileting Toileting Activity did not occur (Clothing management and hygiene only): N/A (no void or bm)  Toileting assist Assist for toileting: Minimal Assistance - Patient > 75%     Transfers Chair/bed transfer  Transfers assist     Chair/bed transfer assist level: Moderate Assistance - Patient 50 - 74%     Locomotion Ambulation   Ambulation assist      Assist level: Contact Guard/Touching assist Assistive device: Walker-rolling Max distance: 205   Walk 10 feet activity   Assist     Assist level: Contact Guard/Touching assist Assistive device: Walker-rolling   Walk 50 feet activity   Assist    Assist level: Contact Guard/Touching assist Assistive device: Walker-rolling    Walk 150 feet activity   Assist Walk 150 feet activity did not occur: Safety/medical concerns  Assist level: Contact Guard/Touching assist Assistive device: Walker-rolling    Walk 10 feet on uneven surface  activity   Assist     Assist level: Minimal Assistance - Patient > 75%     Wheelchair     Assist Is the patient using a wheelchair?: No             Wheelchair 50 feet with 2 turns activity    Assist            Wheelchair 150 feet activity     Assist          Blood pressure (!) 174/90, pulse (!) 55, temperature 97.6 F (36.4 C), resp. rate 18, height '5\' 10"'$  (1.778 m), weight 115.8 kg, SpO2 98 %.  Medical Problem List and Plan: 1. 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  Status post right craniotomy resection of tumor worrisome for glioblastoma 10/20/2021.              -patient may shower             -ELOS/Goals: 10-12 days, supervision to min assist goals  --Continue CIR  therapies including PT, OT, and SLP  -day pass granted for family visits  -path + for Grade 4 GBM, neuro-onc f/u, steroid taper  -appreciate palliative care input.    -Dr. Mickeal Skinner to see 3/13 in office to discuss further plan  -HS confusion/sundowning   -empiric keflex for equivocal UA   -will adjust hs sleep regimen again   -decreased keppra, decadron   -I suspect he'll do better once home 2.  Antithrombotics: -DVT/anticoagulation: Awaiting plan from neurosurgery when chronic Eliquis can be resumed.             -antiplatelet therapy: N/A 3. Pain Management/headaches: Oxycodone as needed  -resumed topamax as h/a's recurred off medication 4. Mood/sleep disorder/HS confusion:               -  antipsychotic agents: seroquel 3/9 -suspect this is multifactorial (GBM, bladder, steroids and SE of meds)     -continue keppra '750mg'$  bid today---> '500mg'$  bid at discharge    -dc trazodone, will restart seroquel at '150mg'$  tonight '@2000'$    -prn back up dose of seroquel '50mg'$     -avoid naps during day    -rx uti 5. Neuropsych: This patient is capable of making decisions on his own behalf. 6. Skin/Wound Care: Routine skin checks             -staples out 7. Fluids/Electrolytes/Nutrition: encourage PO  - prerenal azo   -pushing fluids   -recent BMET wnl, he's eating well Repeat labs 3/8 ok 8.  Seizure prophylaxis.  Keppra 1000 mg twice daily 9.  History of CAD.  Plavix remains on hold.  No chest pain or shortness of breath.  Continue Imdur 30 mg daily and nitroglycerin as needed 10.  Diabetes mellitus.  Hemoglobin A1c 9.6.  NovoLog 8 units 3 times daily, Semglee 28 units twice daily.  CBG (last 3)  Recent Labs    11/04/21 1640 11/04/21 2129 11/05/21 0620  GLUCAP 146* 123* 105*    3/5 increased semglee to 30u bid 3/4  3/9- CBGs with some improvement---continue current regimen 11.  COPD.  Oxygen dependent 2 L prior to admission.  Continue nebulizers as directed 12.  Hypertension.  Lopressor 100 mg  twice daily   3/4 borderline to controlled  3/9- BP controlled   13. Urinary frequency: stll present  -UA equivocal but given confusion have begun empiric keflex  -UCX pending  -PVR's 0  -continue trial of toviaz '4mg'$   -off pyridium    At least 50 total minutes were spent in examination of patient, assessment of pertinent data,  formulation of a treatment plan, and in discussion with patient and/or family.    LOS: 9 days A FACE TO FACE EVALUATION WAS PERFORMED  Meredith Staggers 11/05/2021, 9:29 AM

## 2021-11-05 NOTE — Progress Notes (Signed)
Physical Therapy Discharge Summary ? ?Patient Details  ?Name: Jeff Stan Sr. ?MRN: [id number] ?Date of Birth: [birth year] ? ?Today's Date: 11/05/2021 ?PT Individual Time: 9201-0071 and 2197-5883 ?PT Individual Time Calculation (min): 56 min and 24 min ? ? ?Patient has met 8 of 8 long term goals due to improved activity tolerance, improved balance, improved postural control, and increased strength.  Patient to discharge at an ambulatory level Supervision.   Patient's care partner is independent to provide the necessary physical and cognitive assistance at discharge. ? ?Reasons goals not met: NA ? ?Recommendation:  ?Patient will benefit from ongoing skilled PT services in home health setting to continue to advance safe functional mobility, address ongoing impairments in strength, balance, safety awareness, ambulation, endurance, and minimize fall risk. ? ?Equipment: ?RW ? ?Reasons for discharge: treatment goals met and discharge from hospital ? ?Patient/family agrees with progress made and goals achieved: Yes ? ?Skilled Therapeutic Interventions: ?1st Session: Pt received sitting up in recliner with legs in dependent position. Pt asleep but easily roused and agrees to therapy. Reports pain in "different places" but is unable to specify due to aphasia. PT provides rest break, compression stockings, and mobility to manage pain. PT elevates pt's legs to provide totalA to don zip-up compression stockings. Stand step transfer to [facility] with cues for initiation and positioning. WC transport to gym for time management. Pt performs car transfer with cues on safe technique, then navigates ramp with R hand rail and cues for upright gaze and posture to improve balance. Following extended seated rest break, pt ambulates x175', slowly but without physical assistance. PT provides cues to increase stride length, trunk rotation, and maintain upright gaze and posture to improve balance. Pt completes x8 6" steps and x8 3" steps with  bilateral hand rails and cues for sequencing. WC transport back to room. Pt left seated in recliner with all needs within reach and respiratory therapy staff present. ? ?2nd Session: Pt received sitting in recliner with legs dependent. Pt very lethargic but is agreeable to therapy. Reports pain "everywhere". PT provides mobility and rest breaks to manage pain. Stand step transfer to [facility] with verbal cues for positioning. WC transport to gym. Pt tasked with completing BERG balance test, but pt has difficulty following instructions consistently due to lethargy and refuses multiple aspects of test, so no official score recorded. Pt is redirectable to performing toe taps on 6 inch step, 3x8, with minA provided overall, with cues for posture and upright gaze to improve balance. WC transport back to room. Stand step transfer to recliner. Left with all needs within reach. ? ?PT Discharge ?Precautions/Restrictions ?Precautions ?Precautions: Fall ?Precaution Comments: aphasic ?Restrictions ?Weight Bearing Restrictions: No ?Pain Interference ?Pain Interference ?Pain Effect on Sleep: 2. Occasionally ?Pain Interference with Therapy Activities: 2. Occasionally ?Pain Interference with Day-to-Day Activities: 2. Occasionally ?Vision/Perception  ?Vision - History ?Ability to See in Adequate Light: 0 Adequate ?Perception ?Perception: Within Functional Limits ?Praxis ?Praxis: Impaired ?Praxis Impairment Details: Motor planning  ?Cognition ?Overall Cognitive Status: Impaired/Different from baseline ?Arousal/Alertness: Lethargic ?Orientation Level: Disoriented to person ?Awareness: Impaired ?Awareness Impairment: Intellectual impairment ?Problem Solving: Impaired ?Behaviors: Impulsive;Verbal agitation;Poor frustration tolerance ?Safety/Judgment: Impaired ?Comments: Cues for safety ?Sensation ?Sensation ?Light Touch: Impaired Detail ?Peripheral sensation comments: hypersensitive ?Light Touch Impaired Details: Impaired RUE;Impaired  LUE;Impaired RLE;Impaired LLE ?Hot/Cold: Impaired by gross assessment ?Coordination ?Gross Motor Movements are Fluid and Coordinated: No ?Fine Motor Movements are Fluid and Coordinated: Yes ?Coordination and Movement Description: Mildly impaired vs baseline ?Motor  ?Motor ?Motor: Motor  apraxia ?Motor - Discharge Observations: Much improved from eval  ?Mobility ?Bed Mobility ?Bed Mobility: Supine to Sit;Sit to Supine ?Supine to Sit: Supervision/Verbal cueing ?Sit to Supine: Supervision/Verbal cueing ?Transfers ?Transfers: Sit to Stand;Stand to [employer] Transfers ?Sit to Stand: Supervision/Verbal cueing ?Stand to Sit: Supervision/Verbal cueing ?Stand Pivot Transfers: Supervision/Verbal cueing ?Stand Pivot Transfer Details: Verbal cues for technique;Verbal cues for safe use of DME/AE ?Transfer (Assistive device): None ?Locomotion  ?Gait ?Ambulation: Yes ?Gait Assistance: Supervision/Verbal cueing ?Gait Distance (Feet): 175 Feet ?Assistive device: None ?Gait Assistance Details: Verbal cues for technique ?Gait ?Gait: Yes ?Gait Pattern: Impaired ?Gait Pattern: Decreased stride length;Decreased trunk rotation ?Gait velocity: Decreased ?Stairs / Additional Locomotion ?Stairs: Yes ?Stairs Assistance: Supervision/Verbal cueing ?Stair Management Technique: Two rails ?Number of Stairs: 16 ?Height of Stairs: 6 (50% 6" and 50% 3") ?Ramp: Supervision/Verbal cueing ?Curb: Supervision/Verbal cueing ?Wheelchair Mobility ?Wheelchair Mobility: No  ?Trunk/Postural Assessment  ?Cervical Assessment ?Cervical Assessment:  (forward head) ?Thoracic Assessment ?Thoracic Assessment:  (rounded shoulders) ?Lumbar Assessment ?Lumbar Assessment:  (posterior pelvic tilt) ?Postural Control ?Postural Control: Deficits on evaluation (Delayed but imporved from eval)  ?Balance ?Balance ?Balance Assessed: Yes ?Static Sitting Balance ?Static Sitting - Balance Support: No upper extremity supported ?Static Sitting - Level of Assistance: 5: Stand by  assistance ?Dynamic Sitting Balance ?Dynamic Sitting - Balance Support: No upper extremity supported ?Dynamic Sitting - Level of Assistance: 5: Stand by assistance ?Static Standing Balance ?Static Standing - Balance Support: Bilateral upper extremity supported ?Static Standing - Level of Assistance: 5: Stand by assistance ?Dynamic Standing Balance ?Dynamic Standing - Balance Support: During functional activity ?Dynamic Standing - Level of Assistance: 5: Stand by assistance ?Extremity Assessment  ?RLE Assessment ?General Strength Comments: Grossly 3+/5 ?LLE Assessment ?General Strength Comments: Grossly 4-/5 ? ? ? ?Breck Coons, PT, DPT ?11/05/2021, 4:08 PM ?

## 2021-11-05 NOTE — Progress Notes (Signed)
Pt continues to get out of bed 5-6x w/ bed alarm sounding every time,pt  requesting to  use the bathroom. Pt assisted to the bathroom 4x with in the hour; but never actually voiding or having BM. Each time, pt continues to fall asleep sitting on the toilet,  and becomes very agitated with staff when encouraged to get up. Wife continues to yell at the patient which appears to increase the agitation. This nurse and Cherise, NT assisted the pt to the recliner and moved the pt to the nurses station, for closer monitoring. Pt is sleeping in the recliner at this time. So signs of distress or agitation  present.  ?

## 2021-11-05 NOTE — Progress Notes (Signed)
Speech Language Pathology Discharge Summary ? ?Patient Details  ?Name: Jeff Mulkern Sr. ?MRN: [id number] ?Date of Birth: 07/29/1956 ? ?Today's Date: 11/05/2021 ?SLP Individual Time: 2197-5883 ?SLP Individual Time Calculation (min): 45 min ? ?Skilled Therapeutic Interventions: Skilled ST treatment focused on cognitive-communication goals. Pt had just completed OT session prior to arrival. Pt was very lethargic and required max A verbal and tactile cues to sustain attention for 30 second intervals. Pt consumed a hot dog and fries that daughter brought from cafeteria. Pt consumed without overt s/sx of aspiration and benefited from supervision A cues for efficiency with meal, as pt kept eyes closed during meal which of course limited ability to see what he was doing (e.g., used face mask as napkin, placed hot dog on top of drink). During both structured and unstructured language tasks, pt exhibited frequent verbal errors consisting of phonemic and semantic paraphasias, neologisms, perseveration, and jargon without awareness or ability to self correct. Pt was oriented to person only and unable to identify other orientation concepts when given max A multimodal cues and would perseverate with nonsensical responses. Educated pt's spouse and daughter on cognitive-communication strategies to increase functional communication, comprehension, word finding strategies, and overall safety within home environment. Both parties verbalized understanding. Patient was left in recliner with alarm belt placed and activated and immediate needs within reach at end of session.  ? ?Patient has met 1 of 5 long term goals.  Patient to discharge at overall Max level.  ?Reasons goals not met: patient with slow progress, increased confusion and fatigue/lethargy which negatively impacted cognitive and language skills  ? ?Clinical Impression/Discharge Summary: Patient has made minimal gains and has met 1 of 5 long-term goals this admission. Patient  became more lethargic throughout stay which negatively impacted progress with cognitive and language goals. Earlier in patient's rehab stay he was able to complete automatic speech tasks with min-to-mod A cues, object naming/generative naming/object description with mod A, comprehension of basic content and commands with min-to-mod A cues. Pt exhibited mild-to-mod verbal errors consisting of mainly phonemic and semantic paraphasias. As of patient's grad day,required overall max A for focused attention secondary to lethargy/fatigue, basic verbal expression, and basic comprehension, while also demonstrating frequent verbal errors consisting of phonemic and semantic paraphasias, neologisms, perseveration, and jargon without awareness or ability to self correct. This appeared heavily attributed to fatigue level therefore difficult to assess true progress with goals. Anticipate pt's progress and level of assistance to fluctuate depending on fatigue level. Patient is currently tolerating a regular texture diet and thin liquids and requires sup A for implementation of safe swallow precautions and strategies. Patient and family education is complete and patient to discharge at overall max A level from a cognitive-communication standpoint. Patient's care partner is independent to provide the necessary physical and cognitive assistance at discharge. Patient would benefit from continued SLP services to maximize cognitive-linguistic function and functional independence.   ? ?Care Partner:  ?Caregiver Able to Provide Assistance: Yes  ?Type of Caregiver Assistance: Physical;Cognitive ? ?Recommendation:  ?24 hour supervision/assistance;Outpatient SLP;Home Health SLP  ?Rationale for SLP Follow Up: Maximize functional communication;Maximize cognitive function and independence;Reduce caregiver burden  ? ?Equipment: None recommended  ? ?Reasons for discharge: Discharged from hospital  ? ?Patient/Family Agrees with Progress Made and  Goals Achieved: Yes  ? ? ?Jeff Wood ?11/05/2021, 3:58 PM ? ?

## 2021-11-05 NOTE — Progress Notes (Signed)
Patient ID: Jeff Ressler., male   DOB: 1956-08-10, 66 y.o.   MRN: [id number] ? ? ? ?Progress Note from the Palliative Medicine Team at [facility] ? ? ?Patient Name: Jeff Gasparyan Sr.        ?Date: 11/05/2021 ?DOB: 14-Aug-1956  Age: 66 y.o. MRN#: [id number] ?Attending Physician: Meredith Staggers, MD ?Primary Care Physician: Pcp, No ?Admit Date: 10/27/2021 ? ? ?Medical records reviewed  ? ? 66 y.o. 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    UDS negative.   ?  ?CT head concerning for 3 to 4 cm region of subacute infarction versus mass in left posterior temporal lobe.  Neurology consulted.  He was started on Keppra 500 mg twice daily. Spot and LTM EEG without seizure or epileptiform discharge.   ?  ?CT angio head and neck and CT cerebral perfusion with contrast without LVO but showed 2.5 cm hyperemic mass in left posterior temporal lobe.   ?  ?MRI brain without contrast also showed mass lesion of the left temporal lobe with associated edema but truncated exam due to patient's inability to tolerate setting of mental status change. ?  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   ?  ?Neurosurgery consulted and started Decadron. Left temporal craniotomy and resection of tumor on 2/21.    ?  ?Follow-up cranial CT scan 10/26/2021 showed stable resection cavity with small volume adjacent parenchymal blood unchanged mild regional mass effect no new intracranial abnormality. ? ?Patient is currently in  CIR and is progressing with therapies. ?  ? ?This NP visited patient at the bedside as a follow up for palliative medicine needs and emotional  support. ? ?Patient's wife and daughter at bedside.   Continued conversation regarding current medical situation, treatment option decisions moving forward, goals of care, and anticipatory care needs. ? ?Continued  education offered on current medical situation, family understand the seriousness of glioblastoma, plan is to follow-up with Dr. Marin Comment oncologist in the outpatient cancer center.  ? ? ?Plan of care ?-DNR/DNI ?-No artificial feeding now or in the future ?-Complete stay at [facility] and transition home ?-Follow-up with outpatient oncology for ongoing treatment options ?-Follow-up with outpatient palliative medicine clinic ? ?     Ultimately comfort is a priority. Family hope for ongoing  improvement and continued quality of life ? ? ?Discussed with family the importance of continued conversation with each other and the medical providers regarding overall plan of care and treatment options,  ensuring decisions are within the context of the patients values and GOCs. ? ?Questions and concerns addressed   ?  ?Total time spent on the unit was 50 minutes ? ?Greater than 50% of the time was spent in counseling and coordination of care ? ?Wadie Lessen NP  ?Palliative Medicine Team Team Phone # [phone] ?Pager [phone] ?  ?

## 2021-11-05 NOTE — Progress Notes (Addendum)
Occupational Therapy Discharge Summary ? ?Patient Details  ?Name: Jeff Winstanley Sr. ?MRN: [id number] ?Date of Birth: 1956/06/20 ? ?Today's Date: 11/05/2021 ?OT Individual Time: 1040-1120 ?OT Individual Time Calculation (min): 40 min  ? ?1:1 Grad day! Family (pt's wife and daughter) present for session. Pt very lethargic and tired today. Pt with difficulty remaining on task and continues to be hypersensitive to touch. Engaged in showering today as planned with family and pt. Pt very hypersensitive to the water pressure and tempeture; pt unable to fully bathe self requiring A and at a very slow rate with max external encouragement and support. PT able to transfer and demonstrate functional mobility around room with HHA with supervision- HHA used to cue initiation in tasks. Pt with raw skin in abdominal folds and needed to lay down to address skin issues- Pt reluctant to allow this due to the sensitivity but with extra time allowed care. Concerned that the hypersensitivity to touch could prevent pt from allowing care at times - discussed with family.  ?Pt dressed with extra time. Left resting in the recliner in prep for lunch.  ? ? ?Patient has met 8 of 11 long term goals due to improved activity tolerance, improved balance, and improved coordination.  Pt has become more lethargic throughout his stay and more hypersensitive to touch. Pt keeps his eyes closed a lot through the day and does not have a pattern of good sleep at night. Pt can shower but at grad day the water pressure and temperature was too much input for him to participate in attempting to wash. Pt continues to demostrate difficulty with expressing his needs and wants verbally but can through gestures or temperament. Anticipate  pt's progress to wax/wean at home with good days and more difficult ones.  Patient to discharge at overall  supervision to min A  level.  Patient's care partner is independent to provide the necessary physical and cognitive  assistance at discharge.   ? ?Reasons goals not met: Pt needed A in shower due to sensitivity and not attempting to wash due to sensitivity. Pt also requires mod to max A for attention to task and awareness of deficits.  ? ?Recommendation:  ?Patient will benefit from ongoing skilled OT services in home health setting to continue to advance functional skills in the area of BADL. ? ?Equipment: ?No equipment provided ? ?Reasons for discharge: treatment goals met and discharge from hospital ? ?Patient/family agrees with progress made and goals achieved: Yes ? ?OT Discharge ?Precautions/Restrictions  ?Precautions ?Precautions: Fall ?Precaution Comments: aphasic; hypersenstive ?Restrictions ?Weight Bearing Restrictions: No ?General ?  ?Vital Signs ?Oxygen Therapy ?SpO2: 98 % ?O2 Device: Room Air ?Pain ?Pain Assessment ?Faces Pain Scale: Hurts even more ?ADL ?ADL ?Eating: Supervision/safety ?Grooming: Supervision/safety ?Where Assessed-Grooming: Chair ?Upper Body Bathing: Other (comment) ?Where Assessed-Upper Body Bathing: Shower ?Lower Body Bathing: Other (comment) ?Where Assessed-Lower Body Bathing: Shower ?Upper Body Dressing: Supervision/safety ?Where Assessed-Upper Body Dressing: Edge of bed ?Lower Body Dressing: Supervision/safety ?Where Assessed-Lower Body Dressing: Edge of bed ?Toileting: Supervision/safety ?Where Assessed-Toileting: Toilet ?Toilet Transfer: Close supervision ?Toilet Transfer Method: Ambulating ?Toilet Transfer Equipment: Bedside commode ?Tub/Shower Transfer: Close supervison ?Tub/Shower Equipment: Radio broadcast assistant, Walk in shower ?ADL Comments: Min to Mod verbal/tactile cues for sequencing. ?Vision ?Baseline Vision/History: 1 Wears glasses ?Additional Comments: pt chooses to keep eyes closed most of the session but doesnt report problems (verbally or functionally) ?Perception  ?Perception: Within Functional Limits ?Comments: Pt very hyper sensitive to all touch ?Praxis ?Praxis: Impaired ?Praxis  Impairment  Details: Motor planning ?Praxis-Other Comments: improved in ADl at d/c but limited participation in tasks ?Cognition ?Overall Cognitive Status: Impaired/Different from baseline ?Arousal/Alertness: Lethargic ?Orientation Level: Disoriented to person ?Year: Other (Comment) ?Month:  (nonsensical) ?Day of Week:  (nonsensical) ?Attention: Sustained ?Focused Attention: Impaired ?Focused Attention Impairment: Verbal basic ?Sustained Attention: Impaired ?Sustained Attention Impairment: Functional basic;Verbal basic ?Memory: Impaired ?Memory Impairment: Decreased recall of new information;Storage deficit;Retrieval deficit ?Immediate Memory Recall:  (nonsensical  0/3) ?Memory Recall Sock: Not able to recall (nonsensical) ?Memory Recall Blue: Not able to recall (nonsensical) ?Memory Recall Bed: Not able to recall (nonsensical) ?Awareness: Impaired ?Awareness Impairment: Intellectual impairment ?Problem Solving: Impaired ?Problem Solving Impairment: Functional basic ?Behaviors: Impulsive;Verbal agitation;Poor frustration tolerance ?Safety/Judgment: Impaired ?Comments: Cues for safety ?Sensation ?Sensation ?Light Touch: Impaired Detail ?Peripheral sensation comments: hyper sensitive ?Light Touch Impaired Details: Impaired RUE;Impaired LUE;Impaired RLE;Impaired LLE ?Hot/Cold: Impaired by gross assessment ?Coordination ?Gross Motor Movements are Fluid and Coordinated: No ?Fine Motor Movements are Fluid and Coordinated: Yes ?Coordination and Movement Description: Mildly impaired vs baseline ?Motor  ?Motor ?Motor: Motor apraxia ?Motor - Discharge Observations: Much improved from eval ?Mobility  ?Bed Mobility ?Bed Mobility: Supine to Sit;Sit to Supine ?Supine to Sit: Supervision/Verbal cueing ?Sit to Supine: Supervision/Verbal cueing ?Transfers ?Sit to Stand: Supervision/Verbal cueing ?Stand to Sit: Supervision/Verbal cueing  ?Trunk/Postural Assessment  ?Cervical Assessment ?Cervical Assessment:  (forward head) ?Thoracic  Assessment ?Thoracic Assessment:  (rounded shoulders) ?Lumbar Assessment ?Lumbar Assessment:  (posterior pelvic tilt) ?Postural Control ?Postural Control: Deficits on evaluation (Delayed but imporved from eval)  ?Balance ?Static Sitting Balance ?Static Sitting - Balance Support: No upper extremity supported ?Static Sitting - Level of Assistance: 5: Stand by assistance ?Dynamic Sitting Balance ?Dynamic Sitting - Balance Support: No upper extremity supported ?Dynamic Sitting - Level of Assistance: 5: Stand by assistance ?Static Standing Balance ?Static Standing - Balance Support: Bilateral upper extremity supported ?Static Standing - Level of Assistance: 5: Stand by assistance ?Dynamic Standing Balance ?Dynamic Standing - Level of Assistance: 5: Stand by assistance ?Extremity/Trunk Assessment ?RUE Assessment ?Passive Range of Motion (PROM) Comments: Greatly limited shoulder ROM at baseline ?General Strength Comments: Difficult to assess at shoulder 2/2 AROM deficits; MMT at elbow, wrist and digits grossly 3/5. ?LUE Assessment ?Passive Range of Motion (PROM) Comments: Greatly limited shoulder ROM at baseline ?General Strength Comments: Difficult to assess at shoulder 2/2 AROM deficits; MMT at elbow, wrist and digits grossly 3/5. ? ? ?Nicoletta Ba ?11/05/2021, 11:47 AM ?

## 2021-11-05 NOTE — Progress Notes (Signed)
Discussed insulin and insulin pen administration with pt's spouse and daughter. Both demonstrated administration techniques using a demo kit. Spouse administered lunch time insulin dose to patient. Pt tolerated well. No further questions at this time. ? ?Gerald Stabs, RN ? ?

## 2021-11-05 NOTE — Progress Notes (Signed)
Patient ID: Jeff Wood., male   DOB: Sep 02, 1955, 66 y.o.   MRN: [id number] ? ?SW spoke with pt wife to discuss HHA preference as change from outpatient. Report she requested she would like to begin with [place] first. No HHA preference. Reports she would like coupons for insulin needs if possible. States insurance requires them to use [employer]. ? ?SW ordered RW with [place] via parachute.  ? ?Loralee Pacas, MSW, LCSWA ?Office: [phone] ?Cell: [phone] ?Fax: [phone]  ?

## 2021-11-06 ENCOUNTER — Telehealth: Payer: Self-pay

## 2021-11-06 ENCOUNTER — Other Ambulatory Visit (HOSPITAL_COMMUNITY): Payer: Self-pay

## 2021-11-06 DIAGNOSIS — F05 Delirium due to known physiological condition: Secondary | ICD-10-CM

## 2021-11-06 DIAGNOSIS — I10 Essential (primary) hypertension: Secondary | ICD-10-CM

## 2021-11-06 LAB — GLUCOSE, CAPILLARY: Glucose-Capillary: 135 mg/dL — ABNORMAL HIGH (ref 70–99)

## 2021-11-06 MED ORDER — QUETIAPINE FUMARATE 200 MG PO TABS
200.0000 mg | ORAL_TABLET | Freq: Every day | ORAL | 0 refills | Status: AC
  Filled 2021-11-06: qty 30, 30d supply, fill #0

## 2021-11-06 NOTE — Telephone Encounter (Signed)
Received a fax stating the Nancee Liter is not covered through [place]. They stated alternate medications are Lantus, Toujeo Max Solostar, [employer], Coker, or [place]. ? ?Received a fax stating that the Novolog 100 unit.mL flexpen is not covered through [place]. They stated alternate medications are Humalog or Lyumjev. ? ?Christella Scheuermann stated the Fesoterodine ER 4 mg is on the formulary, but they will only pay for it if other drugs in the formulary have been tried and failed first. ?

## 2021-11-06 NOTE — Progress Notes (Signed)
Inpatient Rehabilitation Care Coordinator ?Discharge Note  ? ?Patient Details  ?Name: Jeff Krygier Sr. ?MRN: [id number] ?Date of Birth: 1955/12/09 ? ? ?Discharge location: D/c to home with wife anfd dtr ? ?Length of Stay: 9 days ? ?Discharge activity level: Supervision ? ?Home/community participation: Limited ? ?Patient response IH:[id number] Literacy - How often do you need to have someone help you when you read instructions, pamphlets, or other written material from your doctor or pharmacy?: Never ? ?Patient response DG:LOVFIE Isolation - How often do you feel lonely or isolated from those around you?: Patient unable to respond ? ?Services provided included: MD, RD, PT, OT, SLP, RN, CM, TR, Pharmacy, Neuropsych, SW ? ?Financial Services:  ?Charity fundraiser Utilized: Medicare ?  ? ?Choices offered to/list presented to: Yes ? ?Follow-up services arranged:  ?Home Health, DME ?[facility] Health Agency: [facility] for HHPT/OT/SLP/SNA  ?  ?DME : [employer] [facility] for RW ?  ? ?Patient response to transportation need: ?Is the patient able to respond to transportation needs?: Yes ?In the past 12 months, has lack of transportation kept you from medical appointments or from getting medications?: No ?In the past 12 months, has lack of transportation kept you from meetings, work, or from getting things needed for daily living?: No ? ?Comments (or additional information): ? ?Patient/Family verbalized understanding of follow-up arrangements:  Yes ? ?Individual responsible for coordination of the follow-up plan: contact pt wife ? ?Confirmed correct DME delivered: Rana Snare 11/06/2021   ? ?Rana Snare ?

## 2021-11-06 NOTE — Progress Notes (Signed)
PT requested to return to his room, pt assisted to the toilet and wife inform to notify staff when pt has finished so he could be assisted back to the bed. ?

## 2021-11-06 NOTE — Progress Notes (Signed)
Pt and wife heard yelling at each other. This nurse entered the room and pt found standing up at the sink with no clothes on, both pt and wife appears to be agitated. The pt was encouraged to get clothes on and sit in the recliner.Wife encouraged to get rest while the pt is sitting at the nurses station with the staff. At this point no medications seem effective for aiding in sleep. Pt fully dressed, sitting at the nurses station in recliner, sleeping at this time with chair alarm on. ?

## 2021-11-06 NOTE — Progress Notes (Signed)
Pt sitting on the side of the bed, refusing to move to the recliner &/or in the bed properly. Pt appears to be dosing off. This nurse educated the pt and family on fall prevention protocol but insist on not using bed and chair alarms. bed alarm placed on the only suitable setting while pt is sitting on the side. Pt denies pain at this time. Pts wife has multiple concerns and questions regarding medications after discharge; provider will be made aware.  ? ?Pt slept an average of 2-3 hours total through out the night; even with the administration of PRN medications  ?

## 2021-11-06 NOTE — Progress Notes (Signed)
?                                                       PROGRESS NOTE ? ? ?Subjective/Complaints: ?Restless night again despite meds. Wife and pt arguing frequently with each other.  ? ?ROS: Limited due to cognitive/behavioral  ? ? ?Objective: ?  ?CT HEAD WO CONTRAST (5MM) ? ?Result Date: 11/04/2021 ?CLINICAL DATA:  Mental status change. EXAM: CT HEAD WITHOUT CONTRAST TECHNIQUE: Contiguous axial images were obtained from the base of the skull through the vertex without intravenous contrast. RADIATION DOSE REDUCTION: This exam was performed according to the departmental dose-optimization program which includes automated exposure control, adjustment of the mA and/or kV according to patient size and/or use of iterative reconstruction technique. COMPARISON:  October 27, 2021 FINDINGS: Brain: Stable postsurgical changes from posterior left temporal lobe resection containing some gas and fluid. The previously demonstrated adjacent parenchymal hemorrhage is not well seen on today's study. Regional hypoattenuation likely represents edema is not significantly changed. 4 mm midline shift to the right. Stable mass effect. The ventricular size is stable. Vascular: No hyperdense vessel or unexpected calcification. Skull: 6 left lateral craniotomy. Sinuses/Orbits: No acute finding. Other: Postsurgical changes in the left scalp. IMPRESSION: Stable postsurgical changes from posterior left temporal lobe resection containing some gas and fluid. The previously demonstrated adjacent parenchymal hemorrhage is not well seen on today's study. Stable mass effect and 4 mm midline shift to the right. Electronically Signed   By: Fidela Salisbury M.D.   On: 11/04/2021 18:44   ?Recent Labs  ?  11/04/21 ?0859  ?WBC 9.6  ?HGB 13.4  ?HCT 39.7  ?PLT 163  ? ? ?Recent Labs  ?  11/04/21 ?0859  ?NA 134*  ?K 3.7  ?CL 102  ?CO2 23  ?GLUCOSE 135*  ?BUN 23  ?CREATININE 0.92  ?CALCIUM 8.9  ? ? ? ?Intake/Output Summary (Last 24 hours) at 11/06/2021  0912 ?Last data filed at 11/06/2021 0700 ?Gross per 24 hour  ?Intake 960 ml  ?Output --  ?Net 960 ml  ?  ? ?  ? ?Physical Exam: ?Vital Signs ?Blood pressure (!) 152/88, pulse (!) 56, temperature 97.6 ?F (36.4 ?C), resp. rate 16, height '5\' 10"'$  (1.778 m), weight 115.8 kg, SpO2 97 %. ? ? ?Constitutional: No distress . Vital signs reviewed. ?HEENT: NCAT, EOMI, oral membranes moist ?Neck: supple ?Cardiovascular: RRR without murmur. No JVD    ?Respiratory/Chest: CTA Bilaterally without wheezes or rales. Normal effort    ?GI/Abdomen: BS +, non-tender, non-distended ?Ext: no clubbing, cyanosis, 2-3+ LE edema ?Psych: flat but does engage. ?Skin: Clean dry. Crani incisions clean/dry, staples out ?Neuro:  fatigued, word finding deficits. confused Follows commands.  No focal CN findings. 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 Moves all 4's however 3+ to 4/5. Decreased LT in stocking glove distribution in each lower leg and bilateral hands-chronic LE sensory deficits ?Musculoskeletal: no focal tenderness.   ? ? ?Assessment/Plan: ?1. Functional deficits which require 3+ hours per day of interdisciplinary therapy in a comprehensive inpatient rehab setting. ?Physiatrist is providing close team supervision and 24 hour management of active medical problems listed below. ?Physiatrist and rehab team continue to assess barriers to discharge/monitor patient progress toward functional and medical goals ? ?Care Tool: ? ?Bathing ? Bathing activity did not occur: Refused ?Body parts bathed by patient:  Face  ? Body parts bathed by helper: Buttocks, Right lower leg, Left lower leg ?Body parts n/a: Right arm, Left upper leg, Right lower leg, Left arm, Chest, Abdomen, Left lower leg, Front perineal area, Buttocks, Right upper leg ?  ?Bathing assist Assist Level: Minimal Assistance - Patient > 75% ?  ?  ?Upper Body Dressing/Undressing ?Upper body dressing   ?What is the patient wearing?: Pull over shirt ?   ?Upper body assist Assist Level: Set up  assist ?   ?Lower Body Dressing/Undressing ?Lower body dressing ? ? ?   ?What is the patient wearing?: Pants, Underwear/pull up ? ?  ? ?Lower body assist Assist for lower body dressing: Supervision/Verbal cueing ?   ? ?Toileting ?Toileting Toileting Activity did not occur (Probation officer and hygiene only): N/A (no void or bm)  ?Toileting assist Assist for toileting: Supervision/Verbal cueing ?  ?  ?Transfers ?Chair/bed transfer ? ?Transfers assist ?   ? ?Chair/bed transfer assist level: Supervision/Verbal cueing ?  ?  ?Locomotion ?Ambulation ? ? ?Ambulation assist ? ?   ? ?Assist level: Supervision/Verbal cueing ?Assistive device: No Device ?Max distance: 3'  ? ?Walk 10 feet activity ? ? ?Assist ?   ? ?Assist level: Supervision/Verbal cueing ?Assistive device: No Device  ? ?Walk 50 feet activity ? ? ?Assist   ? ?Assist level: Supervision/Verbal cueing ?Assistive device: No Device  ? ? ?Walk 150 feet activity ? ? ?Assist Walk 150 feet activity did not occur: Safety/medical concerns ? ?Assist level: Supervision/Verbal cueing ?Assistive device: No Device ?  ? ?Walk 10 feet on uneven surface  ?activity ? ? ?Assist   ? ? ?Assist level: Supervision/Verbal cueing ?Assistive device:  (Handrail)  ? ?Wheelchair ? ? ? ? ?Assist Is the patient using a wheelchair?: No ?  ?  ? ?  ?   ? ? ?Wheelchair 50 feet with 2 turns activity ? ? ? ?Assist ? ?  ?  ? ? ?   ? ?Wheelchair 150 feet activity  ? ? ? ?Assist ?   ? ? ?   ? ?Blood pressure (!) 152/88, pulse (!) 56, temperature 97.6 ?F (36.4 ?C), resp. rate 16, height '5\' 10"'$  (1.778 m), weight 115.8 kg, SpO2 97 %. ? ?Medical Problem List and Plan: ?1. 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  Status post right craniotomy resection of tumor worrisome for glioblastoma 10/20/2021.  ?            -dc home today ? -path + for Grade 4 GBM, neuro-onc f/u, steroid taper ? -appreciate palliative care input.   ? -Dr. Mickeal Skinner to see 3/13 in office to discuss further plan ? -f/u with  me in 3-4 weeks ?2.  Antithrombotics: ?-DVT/anticoagulation: Awaiting plan from neurosurgery when chronic Eliquis can be resumed. ?            -antiplatelet therapy: N/A ?3. Pain Management/headaches: Oxycodone as needed ? -resumed topamax as h/a's recurred off medication ?4. Mood/sleep disorder/HS confusion:   ?            -antipsychotic agents: seroquel ?3/10 -suspect this is multifactorial (GBM, bladder, steroids, and SE of meds) hopeful for improvement once he's home.  ?   -  keppra decrease to '500mg'$  bid at discharge ?   -adjust seroquel to '200mg'$  at 2000 nightly ?  -avoid naps during day ?   -ucx negative but will give a 5 day course of abx ?5. Neuropsych: This patient is capable of making decisions on his own  behalf. ?6. Skin/Wound Care: Routine skin checks ?            -staples out ?7. Fluids/Electrolytes/Nutrition: encourage PO ? - prerenal azo ?  -pushing fluids ?  -recent BMET wnl, he's eating well ?Repeat labs 3/8 ok ?8.  Seizure prophylaxis.  Keppra 1000 mg twice daily ?9.  History of CAD.  Plavix remains on hold.  No chest pain or shortness of breath.  Continue Imdur 30 mg daily and nitroglycerin as needed ?10.  Diabetes mellitus.  Hemoglobin A1c 9.6.  NovoLog 8 units 3 times daily, Semglee 28 units twice daily. ? CBG (last 3)  ?Recent Labs  ?  11/05/21 ?1633 11/05/21 ?2106 11/06/21 ?0631  ?GLUCAP 171* 224* 135*  ?  3/5 increased semglee to 30u bid 3/4 ? 3/10- CBGs with some improvement---discussed meds/diet with wife ?11.  COPD.  Oxygen dependent 2 L prior to admission.  Continue nebulizers as directed ?12.  Hypertension.  Lopressor 100 mg twice daily ?  3/4 borderline to controlled ? 3/10- BP controlled   ?13. Urinary frequency: stll present ? -UA equivocal   ? -UCX neg--keflex for 5 days ? -PVR's 0 ? -can continue toviaz '4mg'$  at home, this seems to have helped ? -off pyridium   ? ?  ? ?LOS: ?10 days ?A FACE TO FACE EVALUATION WAS PERFORMED ? ?Meredith Staggers ?11/06/2021, 9:12 AM  ? ?  ?

## 2021-11-06 NOTE — Progress Notes (Signed)
Patient discharged to home at 12 noon in private vehicle with all personal belongings Discharge information reviewed previously with patient and family by provider. Meds provided from on site pharmacy. Patient and family deny any further questions at this time. ?

## 2021-11-06 NOTE — Progress Notes (Signed)
Pt was assisted back to the recliner, chair alarm on, wife at the bedside and awake, and informed to notify staff if pt became agitated.  ?

## 2021-11-06 NOTE — Progress Notes (Signed)
Patient ID: Jeff Wood., male   DOB: 15-Apr-1956, 66 y.o.   MRN: [id number] ? ?SW sent [place] orders to [facility].  ? ?SW spoke with pt wife Pamala Hurry [phone]) to inform on HHA, and informed contact information will be included in discharge instructions. Confirms RW was received.  ? ?Loralee Pacas, MSW, LCSWA ?Office: [phone] ?Cell: [phone] ?Fax: [phone]  ?

## 2021-11-06 NOTE — Progress Notes (Signed)
This nurse was rounding on pt. Pt found sitting in the chair, with no chair alarm on, the wife had turned it off. Pt denied needing assistance, wife immediately started yelling at the pt to get back in the bed. Wife was also laying on the couch covered up with eyes closed, and not monitoring the pt. This nurse encouraged the pt to return to the bed, pt refused. Pt was left in the chair with alarm on.  ?

## 2021-11-09 ENCOUNTER — Inpatient Hospital Stay (HOSPITAL_BASED_OUTPATIENT_CLINIC_OR_DEPARTMENT_OTHER): Payer: Medicare Other | Admitting: Internal Medicine

## 2021-11-09 ENCOUNTER — Other Ambulatory Visit: Payer: Self-pay

## 2021-11-09 VITALS — BP 188/86 | HR 65 | Temp 97.6°F | Resp 18 | Ht 70.0 in | Wt 269.4 lb

## 2021-11-09 DIAGNOSIS — Z79899 Other long term (current) drug therapy: Secondary | ICD-10-CM | POA: Diagnosis not present

## 2021-11-09 DIAGNOSIS — R4701 Aphasia: Secondary | ICD-10-CM | POA: Insufficient documentation

## 2021-11-09 DIAGNOSIS — Z510 Encounter for antineoplastic radiation therapy: Secondary | ICD-10-CM | POA: Diagnosis present

## 2021-11-09 DIAGNOSIS — R7401 Elevation of levels of liver transaminase levels: Secondary | ICD-10-CM | POA: Diagnosis not present

## 2021-11-09 DIAGNOSIS — R569 Unspecified convulsions: Secondary | ICD-10-CM

## 2021-11-09 DIAGNOSIS — C712 Malignant neoplasm of temporal lobe: Secondary | ICD-10-CM | POA: Diagnosis present

## 2021-11-09 DIAGNOSIS — C719 Malignant neoplasm of brain, unspecified: Secondary | ICD-10-CM

## 2021-11-09 MED ORDER — DEXAMETHASONE 2 MG PO TABS: 2.0000 mg | ORAL_TABLET | Freq: Every day | ORAL | 0 refills | Status: DC

## 2021-11-09 MED ORDER — DIVALPROEX SODIUM 500 MG PO DR TAB: 1000.0000 mg | DELAYED_RELEASE_TABLET | Freq: Two times a day (BID) | ORAL | 3 refills | Status: AC

## 2021-11-09 NOTE — Progress Notes (Incomplete)
Location/Histology of Brain Tumor:  FINAL MICROSCOPIC DIAGNOSIS:  A and B. BRAIN TUMOR, LEFT TEMPORAL, RESECTION:  -  Glioblastoma, IDH-wild-type, WHO grade 4   Patient presented with symptoms of:  acute onset of expressive aphasia in mid-February. Patient was admitted to the hospital for observation and evaluation.  CT scan demonstrated evidence of a left temporal lesion worrisome for infarct or mass.   CT Head w/o Contrast  11/04/2021 --IMPRESSION: Stable postsurgical changes from posterior left temporal lobe resection containing some gas and fluid. The previously demonstrated adjacent parenchymal hemorrhage is not well seen on today's study. Stable mass effect and 4 mm midline shift to the right.  MRI Brain w/ & w/o Contrast 10/18/2021 --IMPRESSION: 2.4 x 2.7 x 2.8 cm solitary and avidly enhancing mass involving the posterior left temporal lobe, concerning for a high-grade primary CNS neoplasm. Surrounding vasogenic edema and/or infiltrating tumor without significant midline shift. No other acute intracranial abnormality.  Past or anticipated interventions, if any, per neurosurgery:  10/20/2021 --Dr. Earnie Larsson Left temporal craniotomy for tumor with Brain Lab  APPLICATION OF CRANIAL NAVIGATION (Left)  Past or anticipated interventions, if any, per medical oncology:  Saw Dr. Cecil Cobbs on 11/09/2021  Dose of Decadron, if applicable: ***$RemoveBeforeDEI'2mg'[id number]$  PO twice a day  Recent neurologic symptoms, if any:  Seizures: *** Headaches: *** Nausea: *** Dizziness/ataxia: *** Difficulty with hand coordination: *** Focal numbness/weakness: *** Visual deficits/changes: *** Confusion/Memory deficits: ***  Painful bone metastases at present, if any: {:18581}  SAFETY ISSUES: Prior radiation? *** Pacemaker/ICD? *** Possible current pregnancy? N/A Is the patient on methotrexate? ***  Additional Complaints / other details: ***

## 2021-11-09 NOTE — Progress Notes (Incomplete)
Radiation Oncology         [phone]) [phone] ________________________________  Initial Outpatient Consultation  Name: Jeff Paulsen Sr. MRN: [id number]  Date: 11/10/2021  DOB: 06-05-1956  CC:Pcp, No  Earnie Larsson, MD   REFERRING PHYSICIAN: Earnie Larsson, MD  DIAGNOSIS: No diagnosis found.  Glioblastoma, IDH-wild-type, WHO grade 4    Cancer Staging  No matching staging information was found for the patient.  CHIEF COMPLAINT: Here to discuss management of brain cancer  HISTORY OF PRESENT ILLNESS::Jeff Adriaan Maltese Sr. is a 66 y.o. male who presented to the ED and was admitted on 10/15/21 with acute onset confusion and aphasia with concern for CVA. Head CT performed for evaluation in the ED demonstrated a 3 to 4 cm region of subacute infarction versus mass in the left posterior temporal lobe. CTA of the head and neck and perfusion CT of the brain on 10/15/21 further revealed a hyperemic mass in the left posterior temporal lobe, measuring about 2.5 cm in size. MRI of the brain w/o contrast on 10/16/21 showed the mass lesion of the left temporal lobe with associated edema. MRI of the brain with and w/o contrast on 10/18/21 further revealed the avidly enhancing mass involving the posterior left temporal lobe measuring 2.4 x 2.7 x 2.8 cm, concerning for high-grade primary CNS neoplasm. No other acute intracranial abnormalities were appreciated. Neurology was consulted and the patient was started on Keppra 500 mg twice daily and decadron.   Subsequently, the patient underwent left temporal resectioning on 10/20/21 while admitted. Pathology from the procedure revealed WHO grade 4 glioblastoma, IDH-wild-type.   Post craniotomy head CT on 10/21/21 demonstrated postsurgical changes reflecting interval left temporal lobe mass resection, along with a small amount of blood product at the periphery of the cavity, and an intraparenchymal versus subarachnoid hemorrhage superior to the resection cavity. Edema  around the resection cavity and blood products were appreciated to result in partial effacement of the left temporal horn, and trace rightward midline shift. No evidence of acute territorial infarct was appreciated.  Following his surgery, the patient developed decreased functional mobility and was subsequently admitted for inpatient comprehensive rehab on 10/27/21 through 11/06/21. Therapies consisted of PT, ST and OT guided therapy for at least three hours a day / five days a week. Treatment course included DVT prophylaxis and he was cleared to resume Eliquis.  Pain managed used included oxycodone and an as needed trial of Topamax for headaches.  Patient did have some delirium and was maintained on Seroquel, and trazodone was discontinued. He also had bouts of constipation which resolved with laxative assistance.  In regards to his chronic COPD,  patient was noted to be oxygen dependent at home and was treated with inhalers as directed.  He also remained on Keppra for seizure prophylaxis while inpatient. Imaging performed during rehab admission included:  -- Head CT on 10/27/21 which showed the essentially stable appearance of the posterior left temporal resection. Resection cavity was also seen to appear stable other than a small volume of adjacent parenchymal blood. The mild regional mass effect was also noted as unchanged in the interval. No new intracranial abnormalities were appreciated overall.  -- Head CT on 11/04/21 again demonstrated stable postsurgical changes from the posterior left temporal lobe resection, containing some gas and fluid. CT also showed the known mass effect and 4 mm midline shift to the right as stable in the interval. (The previously demonstrated adjacent parenchymal hemorrhage was not well appreciated on this study).  Yesterday, the  patient met with Dr. Mickeal Skinner for his initial consultation. During this visit, the patient endorsed issues with irritability, anger, mood swings, and  poor sleep since being discharged from rehab. Following evaluation and review, Dr. Mickeal Skinner noted that his mood changes were most likely attributed to both dexamethasone and keppra. Further treatment options were discussed with the patient, and he agreed to proceed oral chemotherapy consisting of Temozolomide, and agreed to meet with me today for consideration of RT. In regards to the patient's poor tolerance of keppra, Dr. Mickeal Skinner switched him to Depakote.   PREVIOUS RADIATION THERAPY: No  PAST MEDICAL HISTORY:  has a past medical history of Morbid obesity ([place]) (10/17/2021).    PAST SURGICAL HISTORY: Past Surgical History:  Procedure Laterality Date   APPLICATION OF CRANIAL NAVIGATION Left 10/20/2021   Procedure: APPLICATION OF CRANIAL NAVIGATION;  Surgeon: Earnie Larsson, MD;  Location: [place];  Service: Neurosurgery;  Laterality: Left;   CRANIOTOMY Left 10/20/2021   Procedure: Left temporal craniotomy for tumor with Brain Lab;  Surgeon: Earnie Larsson, MD;  Location: [place];  Service: Neurosurgery;  Laterality: Left;   RADIOLOGY WITH ANESTHESIA N/A 10/18/2021   Procedure: MRI WITH ANESTHESIA;  Surgeon: Radiologist, Medication, MD;  Location: [facility];  Service: Radiology;  Laterality: N/A;    FAMILY HISTORY: family history is not on file.  SOCIAL HISTORY:  reports that he has never smoked. He has never used smokeless tobacco. He reports that he does not currently use alcohol. He reports that he does not use drugs.  ALLERGIES: Iodine and Shellfish allergy  MEDICATIONS:  Current Outpatient Medications  Medication Sig Dispense Refill   acetaminophen (TYLENOL) 325 MG tablet Take 2 tablets (650 mg total) by mouth every 6 (six) hours as needed for mild pain (or Fever >/= 101).     albuterol (VENTOLIN HFA) 108 (90 Base) MCG/ACT inhaler Inhale 1-2 puffs into the lungs every 6 (six) hours as needed for wheezing or shortness of breath. 8.5 g 0   apixaban (ELIQUIS) 5 MG TABS tablet Take 1 tablet (5 mg total) by  mouth 2 (two) times daily. 60 tablet 0   atorvastatin (LIPITOR) 40 MG tablet Take 1 tablet (40 mg total) by mouth daily. 30 tablet 0   cephALEXin (KEFLEX) 500 MG capsule Take 1 capsule (500 mg total) by mouth every 12 (twelve) hours. 6 capsule 0   Cholecalciferol (VITAMIN D3 GUMMIES PO) Take 2 tablets by mouth every evening.     dexamethasone (DECADRON) 2 MG tablet Take 1 tablet (2 mg total) by mouth 2 (two) times daily. 60 tablet 0   diclofenac Sodium (VOLTAREN) 1 % GEL Apply 2 g topically 4 (four) times daily. 200 g 0   famotidine (PEPCID) 20 MG tablet Take 1 tablet (20 mg total) by mouth daily. 30 tablet 0   fesoterodine (TOVIAZ) 4 MG TB24 tablet Take 1 tablet (4 mg total) by mouth daily. 30 tablet 0   fluticasone (FLONASE) 50 MCG/ACT nasal spray Place 2 sprays into both nostrils in the morning and at bedtime.     Fluticasone-Umeclidin-Vilant (TRELEGY ELLIPTA IN) Inhale 1 puff into the lungs daily.     gemfibrozil (LOPID) 600 MG tablet Take 1 tablet (600 mg total) by mouth 2 (two) times daily before a meal. 60 tablet 0   insulin aspart (NOVOLOG FLEXPEN) 100 UNIT/ML FlexPen Inject 8 Units into the skin 3 (three) times daily with meals. 15 mL 11   insulin glargine (LANTUS) 100 UNIT/ML Solostar Pen Inject 30 Units into the  skin 2 (two) times daily. 15 mL 11   Insulin Pen Needle (PEN NEEDLES) 32G X 4 MM MISC Use as directed 100 each 0   isosorbide mononitrate (IMDUR) 30 MG 24 hr tablet Take 1 tablet (30 mg total) by mouth daily. 30 tablet 0   levETIRAcetam (KEPPRA) 500 MG tablet Take 1 tablet (500 mg total) by mouth 2 (two) times daily. 60 tablet 0   metoprolol tartrate (LOPRESSOR) 100 MG tablet Take 1 tablet (100 mg total) by mouth 2 (two) times daily. 60 tablet 0   montelukast (SINGULAIR) 10 MG tablet Take 1 tablet (10 mg total) by mouth every evening. 30 tablet 0   Multiple Vitamins-Minerals (MENS MULTIVITAMIN PO) Take 1 tablet by mouth daily.     nitroGLYCERIN (NITROSTAT) 0.4 MG SL tablet Place  0.4 mg under the tongue every 5 (five) minutes as needed for chest pain.     oxyCODONE-acetaminophen (PERCOCET/ROXICET) 5-325 MG tablet Take 2 tablets by mouth every 6 (six) hours as needed for severe pain. 30 tablet 0   polyethylene glycol (MIRALAX / GLYCOLAX) 17 g packet Take 17 g by mouth 2 (two) times daily. 14 each 0   QUEtiapine (SEROQUEL) 200 MG tablet Take 1 tablet (200 mg total) by mouth at bedtime. 30 tablet 0   topiramate (TOPAMAX) 25 MG tablet Take 1 tablet (25 mg total) by mouth at bedtime. 30 tablet 0   No current facility-administered medications for this encounter.    REVIEW OF SYSTEMS:  Notable for that above.   PHYSICAL EXAM:  vitals were not taken for this visit.   General: Alert and oriented, in no acute distress *** HEENT: Head is normocephalic. Extraocular movements are intact. Oropharynx is clear. Neck: Neck is supple, no palpable cervical or supraclavicular lymphadenopathy. Heart: Regular in rate and rhythm with no murmurs, rubs, or gallops. Chest: Clear to auscultation bilaterally, with no rhonchi, wheezes, or rales. Abdomen: Soft, nontender, nondistended, with no rigidity or guarding. Extremities: No cyanosis or edema. Lymphatics: see Neck Exam Skin: No concerning lesions. Musculoskeletal: symmetric strength and muscle tone throughout. Neurologic: Cranial nerves II through XII are grossly intact. No obvious focalities. Speech is fluent. Coordination is intact. Psychiatric: Judgment and insight are intact. Affect is appropriate.   ECOG = ***  0 - Asymptomatic (Fully active, able to carry on all predisease activities without restriction)  1 - Symptomatic but completely ambulatory (Restricted in physically strenuous activity but ambulatory and able to carry out work of a light or sedentary nature. For example, light housework, office work)  2 - Symptomatic, <50% in bed during the day (Ambulatory and capable of all self care but unable to carry out any work  activities. Up and about more than 50% of waking hours)  3 - Symptomatic, >50% in bed, but not bedbound (Capable of only limited self-care, confined to bed or chair 50% or more of waking hours)  4 - Bedbound (Completely disabled. Cannot carry on any self-care. Totally confined to bed or chair)  5 - Death   Santiago Glad MM, Creech RH, Tormey DC, et al. [id number]). "Toxicity and response criteria of the [facility]". Am. Evlyn Clines. Oncol. 5 (6): 649-55   LABORATORY DATA:  Lab Results  Component Value Date   WBC 9.6 11/04/2021   HGB 13.4 11/04/2021   HCT 39.7 11/04/2021   MCV 95.0 11/04/2021   PLT 163 11/04/2021   CMP     Component Value Date/Time   NA 134 (L) 11/04/2021 2240  K 3.7 11/04/2021 0859   CL 102 11/04/2021 0859   CO2 23 11/04/2021 0859   GLUCOSE 135 (H) 11/04/2021 0859   BUN 23 11/04/2021 0859   CREATININE 0.92 11/04/2021 0859   CALCIUM 8.9 11/04/2021 0859   PROT 6.6 10/27/2021 1718   ALBUMIN 3.1 (L) 10/27/2021 1718   AST 57 (H) 10/27/2021 1718   ALT 56 (H) 10/27/2021 1718   ALKPHOS 74 10/27/2021 1718   BILITOT 0.7 10/27/2021 1718   GFRNONAA >60 11/04/2021 0859         RADIOGRAPHY: CT HEAD WO CONTRAST (5MM)  Result Date: 11/04/2021 CLINICAL DATA:  Mental status change. EXAM: CT HEAD WITHOUT CONTRAST TECHNIQUE: Contiguous axial images were obtained from the base of the skull through the vertex without intravenous contrast. RADIATION DOSE REDUCTION: This exam was performed according to the departmental dose-optimization program which includes automated exposure control, adjustment of the mA and/or kV according to patient size and/or use of iterative reconstruction technique. COMPARISON:  October 27, 2021 FINDINGS: Brain: Stable postsurgical changes from posterior left temporal lobe resection containing some gas and fluid. The previously demonstrated adjacent parenchymal hemorrhage is not well seen on today's study. Regional hypoattenuation likely  represents edema is not significantly changed. 4 mm midline shift to the right. Stable mass effect. The ventricular size is stable. Vascular: No hyperdense vessel or unexpected calcification. Skull: 6 left lateral craniotomy. Sinuses/Orbits: No acute finding. Other: Postsurgical changes in the left scalp. IMPRESSION: Stable postsurgical changes from posterior left temporal lobe resection containing some gas and fluid. The previously demonstrated adjacent parenchymal hemorrhage is not well seen on today's study. Stable mass effect and 4 mm midline shift to the right. Electronically Signed   By: Fidela Salisbury M.D.   On: 11/04/2021 18:44   CT HEAD WO CONTRAST (5MM)  Result Date: 10/27/2021 CLINICAL DATA:  66 year old male postoperative day 7 right posterior temporal lobe craniotomy and tumor resection. Pathology pending. EXAM: CT HEAD WITHOUT CONTRAST TECHNIQUE: Contiguous axial images were obtained from the base of the skull through the vertex without intravenous contrast. RADIATION DOSE REDUCTION: This exam was performed according to the departmental dose-optimization program which includes automated exposure control, adjustment of the mA and/or kV according to patient size and/or use of iterative reconstruction technique. COMPARISON:  Postoperative head CT 10/21/2021 and earlier. FINDINGS: Brain: Posterior left temporal lobe resection cavity containing some gas and fluid has not significantly changed in size or configuration since 10/21/2021. Small round foci of regional hemorrhage are also stable (series 3, images 19 and 22, up to 13 mm individually). Regional white matter hypodensity appears mildly regressed. Trace left side subdural pneumocephalus persists. Stable trace rightward midline shift. Mild mass effect on the atrium of the left lateral ventricle is stable. No ventriculomegaly. Basilar cisterns remain patent. No new intracranial hemorrhage or evidence of cortically based acute infarction.  Vascular: Mild Calcified atherosclerosis at the skull base. Skull: Stable left lateral craniotomy. No acute osseous abnormality identified. Sinuses/Orbits: Visualized paranasal sinuses and mastoids are stable and well aerated. Other: Evolving postoperative changes to the left scalp. Skin staples remain in place. Negative orbits soft tissues. IMPRESSION: 1. Essentially stable CT appearance of the posterior left temporal resection. Stable resection cavity with small volume adjacent parenchymal blood. Unchanged mild regional mass effect. 2. No new intracranial abnormality. Electronically Signed   By: Genevie Ann M.D.   On: 10/27/2021 04:50   CT HEAD WO CONTRAST (5MM)  Result Date: 10/21/2021 CLINICAL DATA:  Brain neoplasm status post resection EXAM: CT  HEAD WITHOUT CONTRAST TECHNIQUE: Contiguous axial images were obtained from the base of the skull through the vertex without intravenous contrast. RADIATION DOSE REDUCTION: This exam was performed according to the departmental dose-optimization program which includes automated exposure control, adjustment of the mA and/or kV according to patient size and/or use of iterative reconstruction technique. COMPARISON:  Brain MRI 10/18/2021 FINDINGS: Brain: There has been interval left temporoparietal craniotomy for mass resection. The resection cavity in the temporal lobe measures approximately 3.9 cm TV x 1.7 cm AP x 2.1 cm cc. There is a small amount of blood products at the periphery of the cavity. An additional focus of intraparenchymal or subarachnoid blood is seen superior to the resection cavity measuring 1.5 cm by 1.1 cm. There is a small amount of extra-axial fluid, blood, and air overlying the resection site measuring up to approximately 3 mm in thickness in the coronal plane. A small amount of pneumocephalus is also seen overlying the left frontal lobe. There is mild edema surrounding the resection cavity and this additional focus of blood resulting in partial  effacement of the left temporal horn and approximally 2 mm rightward midline shift. The remainder of the brain parenchyma is within normal limits. There is no evidence of acute territorial infarct. Vascular: No hyperdense vessel or unexpected calcification. Skull: Postsurgical changes as above. Sinuses/Orbits: The paranasal sinuses are clear. The globes and orbits are unremarkable. Other: There is expected postoperative soft tissue swelling with fluid and gas in the left scalp. IMPRESSION: 1. Postsurgical changes reflecting interval left temporal lobe mass resection with a small amount of blood products at the periphery of the cavity as well as intraparenchymal versus subarachnoid hemorrhage superior to the resection cavity. Edema around the resection cavity and blood products results in partial effacement of the left temporal horn and trace rightward midline shift. 2. No evidence of acute territorial infarct. Electronically Signed   By: Valetta Mole M.D.   On: 10/21/2021 08:46   MR BRAIN WO CONTRAST  Result Date: 10/16/2021 CLINICAL DATA:  Altered mental status EXAM: MRI HEAD WITHOUT CONTRAST TECHNIQUE: Multiplanar, multiecho pulse sequences of the brain and surrounding structures were obtained without intravenous contrast. COMPARISON:  None. FINDINGS: Truncated examination due to patient mental status and inability to tolerate the full examination. Only diffusion-weighted imaging was obtained. There is a mass lesion of the left temporal lobe with associated edema. No midline shift. No other mass is visible on diffusion-weighted imaging. IMPRESSION: 1. Truncated examination due to patient mental status and inability to tolerate the full examination. 2. Mass lesion of the left temporal lobe with associated edema. When possible, a complete MRI with the administration of intravenous contrast is recommended. Electronically Signed   By: Ulyses Jarred M.D.   On: 10/16/2021 03:56   MR BRAIN W WO CONTRAST  Result  Date: 10/18/2021 CLINICAL DATA:  Initial evaluation for brain/CNS neoplasm, staging. EXAM: MRI HEAD WITHOUT AND WITH CONTRAST TECHNIQUE: Multiplanar, multiecho pulse sequences of the brain and surrounding structures were obtained without and with intravenous contrast. CONTRAST:  2mL GADAVIST GADOBUTROL 1 MMOL/ML IV SOLN COMPARISON:  Prior CTs from 10/15/2021. FINDINGS: Brain: Cerebral volume within normal limits. Mild scattered patchy T2/FLAIR hyperintensity noted involving the periventricular and deep white matter both cerebral hemispheres, nonspecific, but most like related chronic microvascular ischemic disease, overall mild in nature in felt to be within normal limits for age. No evidence for acute or subacute infarct. No areas of chronic cortical infarction. No acute intracranial hemorrhage. Rounded mass positioned at  the left temporal lobe measures 2.4 x 2.7 x 2.8 cm (AP by transverse by craniocaudad). Lesion demonstrates hyperintense T2 signal intensity with avid heterogeneous and predominantly peripheral postcontrast enhancement. Speckled areas of internal susceptibility artifact consistent with necrosis and/or blood products. Surrounding T2/FLAIR signal intensity consistent with vasogenic edema and/or infiltrating tumor. Mild mass effect on the atrium of the left lateral ventricle without significant midline shift. 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 No other mass lesion or abnormal enhancement. No hydrocephalus or ventricular trapping. No extra-axial fluid collection. Pituitary gland suprasellar region normal. Midline structures normal. Vascular: Major intracranial vascular flow voids are maintained. Skull and upper cervical spine: Craniocervical junction within normal limits. Bone marrow signal intensity within normal limits. No focal marrow replacing lesion. No scalp soft tissue abnormality. Sinuses/Orbits: Globes and orbital soft tissues demonstrate no acute finding. Scattered  mucosal thickening noted within the ethmoidal air cells. Fluid seen layering within the nasopharynx. Small bilateral mastoid effusions. Patient is intubated. Other: None. IMPRESSION: 1. 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 Surrounding vasogenic edema and/or infiltrating tumor without significant midline shift. 2. No other acute intracranial abnormality. Electronically Signed   By: Jeannine Boga M.D.   On: 10/18/2021 19:49   CT CEREBRAL PERFUSION W CONTRAST  Result Date: 10/15/2021 CLINICAL DATA:  Neuro deficit, acute, stroke suspected. EXAM: CT ANGIOGRAPHY HEAD AND NECK CT PERFUSION BRAIN TECHNIQUE: Multidetector CT imaging of the head and neck was performed using the standard protocol during bolus administration of intravenous contrast. Multiplanar CT image reconstructions and MIPs were obtained to evaluate the vascular anatomy. Carotid stenosis measurements (when applicable) are obtained utilizing NASCET criteria, using the distal internal carotid diameter as the denominator. Multiphase CT imaging of the brain was performed following IV bolus contrast injection. Subsequent parametric perfusion maps were calculated using RAPID software. RADIATION DOSE REDUCTION: This exam was performed according to the departmental dose-optimization program which includes automated exposure control, adjustment of the mA and/or kV according to patient size and/or use of iterative reconstruction technique. CONTRAST:  Not annotated COMPARISON:  Head CT earlier same day FINDINGS: CTA NECK FINDINGS Aortic arch: Aortic atherosclerosis, mild. Right carotid system: Common carotid artery widely patent to the bifurcation. Carotid bifurcation is normal. Left carotid system: Common carotid artery widely patent to the bifurcation. Carotid bifurcation is normal. Vertebral arteries: Vertebral artery origins are poorly seen.  Beyond the origins, the vessels are patent through the cervical region to the foramen magnum. Skeleton: Ordinary cervical spondylosis. Other neck: No mass or lymphadenopathy. Upper chest: Small amount of dependent pleural fluid and dependent pulmonary atelectasis. Review of the MIP images confirms the above findings CTA HEAD FINDINGS Anterior circulation: Both internal carotid arteries widely patent through the skull base and siphon regions. The anterior and middle cerebral vessels are patent. No large vessel occlusion. Posterior circulation: Both vertebral arteries are widely patent to the basilar. No basilar stenosis. No large vessel occlusion. Venous sinuses: Patent and normal. Anatomic variants: None significant. Review of the MIP images confirms the above findings CT Brain Perfusion Findings: ASPECTS: 9 CBF (<30%) Volume: 18mL Perfusion (Tmax>6.0s) volume: 13mL Mismatch Volume: 4mL Infarction Location:None There is actual hyperemia in the left posterior temporal lobe, with a peripherally enhancing mass lesion visible, measuring about 2.5 cm in size. IMPRESSION: No large vessel occlusion. 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 Electronically Signed   By:  Nelson Chimes M.D.   On: 10/15/2021 22:35   DG CHEST PORT 1 VIEW  Result Date: 10/24/2021 CLINICAL DATA:  Cough EXAM: PORTABLE CHEST 1 VIEW COMPARISON:  10/16/2021 FINDINGS: Transverse diameter of heart is increased. There is interval increase in interstitial markings in the left parahilar region and left lower lung fields. Right lung is unremarkable. There is poor inspiration. There is minimal blunting of left lateral CP angle. There is no pneumothorax. Tip of right IJ chest port is seen in the superior vena cava. IMPRESSION: Cardiomegaly. There is interval increase in interstitial markings in the left parahilar region and left lower lung fields suggesting possible interstitial pneumonia. Electronically Signed   By:  Elmer Picker M.D.   On: 10/24/2021 10:38   DG CHEST PORT 1 VIEW  Result Date: 10/16/2021 CLINICAL DATA:  COVID-19 infection. EXAM: PORTABLE CHEST 1 VIEW COMPARISON:  None. FINDINGS: The heart is enlarged and the mediastinal contour is within normal limits. Lung volumes are low. No consolidation, effusion, or pneumothorax. A right chest port terminates over the superior vena cava. No acute osseous abnormality. IMPRESSION: 1. Low lung volumes with no acute process. 2. Cardiomegaly. Electronically Signed   By: Brett Fairy M.D.   On: 10/16/2021 04:10   EEG adult  Result Date: 10/16/2021 Greta Doom, MD     10/16/2021  1:46 PM History: 66 yo M with AMS with left temporal mass Sedation: none Technique: This EEG was acquired with electrodes placed according to the International 10-20 electrode system (including Fp1, Fp2, F3, F4, C3, C4, P3, P4, O1, O2, T3, T4, T5, T6, A1, A2, Fz, Cz, Pz). The following electrodes were missing or displaced: none. Background: There is a posterior dominant rhythm of 9 Hz. There is focal irregular delta activity maximal in the left temporal region. In addition, there are occasioanl left temporal sharp waves, T7, P7 > F7 Photic stimulation: Physiologic driving is not performed. EEG Abnormalities: 1) Left temporal sharp waves 2) Left temporal slow activity Clinical Interpretation: This EEG is consistent with an area of potential epileptogenicity in the left temporal region. There was no seizure recorded on this study. Please note that lack of epileptiform activity on EEG does not preclude the possibility of epilepsy. Roland Rack, MD [facility] [phone] If 7pm- 7am, please page neurology on call as listed in [place].   Overnight EEG with video  Result Date: 10/19/2021 Lora Havens, MD     10/19/2021  3:26 PM Patient Name: Jeff Cosma Sr. MRN: [id number] Epilepsy Attending: Lora Havens Referring Physician/Provider: Dr Su Monks  Duration: 10/18/2021 2129 to 10/19/2021 1154 Patient history: 66 year old male with altered mental status and left temporal mass.  EEG to evaluate for seizure Level of alertness: Awake, asleep AEDs during EEG study: LEV Technical aspects: This EEG study was done with scalp electrodes positioned according to the 10-20 International system of electrode placement. Electrical activity was acquired at a sampling rate of $Remov'500Hz'[id number]$  and reviewed with a high frequency filter of $RemoveB'70Hz'yOPeXXce$  and a low frequency filter of $RemoveB'1Hz'FRbruONp$ . EEG data were recorded continuously and digitally stored. Description: The posterior dominant rhythm consists of 9 Hz activity of moderate voltage (25-35 uV) seen predominantly in posterior head regions, symmetric and reactive to eye opening and eye closing. Sleep was characterized by vertex waves, sleep spindles (12 to 14 Hz), maximal frontocentral region.  EEG showed intermittent 3 to 5 Hz theta-delta slowing in left temporal region which at times appears rhythmic without definite evolution consistent with lateralized  rhythmic delta activity.  Intermittent generalized 3 to 5 Hz theta and delta slowing was also noted. Hyperventilation and photic stimulation were not performed.   ABNORMALITY -Lateralized rhythmic delta activity, left temporal region -Intermittent slow, generalized IMPRESSION: This study is suggestive of cortical dysfunction in left temporal region which is likely secondary to underlying mass.  Of note, lateralized rhythmic activity is on the ictal-interictal continuum with low potential for seizures.  Additionally there is mild to moderate diffuse encephalopathy, nonspecific etiology.  No definite seizures or epileptiform discharges were seen throughout the recording. Lora Havens   CT HEAD CODE STROKE WO CONTRAST  Result Date: 10/15/2021 CLINICAL DATA:  Code stroke. Neuro deficit, acute, stroke suspected. EXAM: CT HEAD WITHOUT CONTRAST TECHNIQUE: Contiguous axial images were obtained from the  base of the skull through the vertex without intravenous contrast. RADIATION DOSE REDUCTION: This exam was performed according to the departmental dose-optimization program which includes automated exposure control, adjustment of the mA and/or kV according to patient size and/or use of iterative reconstruction technique. COMPARISON:  None. FINDINGS: Brain: The brainstem and cerebellum are normal. Right cerebral hemisphere is normal. There is a 3-4 cm region of abnormal low-density and mild swelling in the left mid to posterior temporal lobe. This could represent a early subacute infarction mass lesion is not excluded. There may be some internal petechial blood products but there is no frank hematoma. Elsewhere the brain appears normal. No hydrocephalus or extra-axial collection. Vascular: No abnormal vascular finding. Skull: Negative Sinuses/Orbits: Clear/normal Other: None ASPECTS ([place] Stroke Program Early CT Score) - Ganglionic level infarction (caudate, lentiform nuclei, internal capsule, insula, M1-M3 cortex): 6 - Supraganglionic infarction (M4-M6 cortex): 3 Total score (0-10 with 10 being normal): 9 IMPRESSION: 1. Question 3-4 cm region of subacute infarction versus possibly a mass lesion in the left posterior temporal lobe. There may be some internal petechial blood products but there is no frank hematoma. 2. ASPECTS is 9 3. Case discussed by telephone with Dr. Lorrin Goodell at 2200 hours. Electronically Signed   By: Nelson Chimes M.D.   On: 10/15/2021 22:06   CT ANGIO HEAD NECK W WO CM (CODE STROKE)  Result Date: 10/15/2021 CLINICAL DATA:  Neuro deficit, acute, stroke suspected. EXAM: CT ANGIOGRAPHY HEAD AND NECK CT PERFUSION BRAIN TECHNIQUE: Multidetector CT imaging of the head and neck was performed using the standard protocol during bolus administration of intravenous contrast. Multiplanar CT image reconstructions and MIPs were obtained to evaluate the vascular anatomy. Carotid stenosis measurements  (when applicable) are obtained utilizing NASCET criteria, using the distal internal carotid diameter as the denominator. Multiphase CT imaging of the brain was performed following IV bolus contrast injection. Subsequent parametric perfusion maps were calculated using RAPID software. RADIATION DOSE REDUCTION: This exam was performed according to the departmental dose-optimization program which includes automated exposure control, adjustment of the mA and/or kV according to patient size and/or use of iterative reconstruction technique. CONTRAST:  Not annotated COMPARISON:  Head CT earlier same day FINDINGS: CTA NECK FINDINGS Aortic arch: Aortic atherosclerosis, mild. Right carotid system: Common carotid artery widely patent to the bifurcation. Carotid bifurcation is normal. Left carotid system: Common carotid artery widely patent to the bifurcation. Carotid bifurcation is normal. Vertebral arteries: Vertebral artery origins are poorly seen. Beyond the origins, the vessels are patent through the cervical region to the foramen magnum. Skeleton: Ordinary cervical spondylosis. Other neck: No mass or lymphadenopathy. Upper chest: Small amount of dependent pleural fluid and dependent pulmonary atelectasis. Review of the MIP images  confirms the above findings CTA HEAD FINDINGS Anterior circulation: Both internal carotid arteries widely patent through the skull base and siphon regions. The anterior and middle cerebral vessels are patent. No large vessel occlusion. Posterior circulation: Both vertebral arteries are widely patent to the basilar. No basilar stenosis. No large vessel occlusion. Venous sinuses: Patent and normal. Anatomic variants: None significant. Review of the MIP images confirms the above findings CT Brain Perfusion Findings: ASPECTS: 9 CBF (<30%) Volume: 71mL Perfusion (Tmax>6.0s) volume: 36mL Mismatch Volume: 46mL Infarction Location:None There is actual hyperemia in the left posterior temporal lobe, with a  peripherally enhancing mass lesion visible, measuring about 2.5 cm in size. IMPRESSION: No large vessel occlusion. 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 Electronically Signed   By: Nelson Chimes M.D.   On: 10/15/2021 22:35      IMPRESSION/PLAN:***    On date of service, in total, I spent *** minutes on this encounter. Patient was seen in person.   __________________________________________   Eppie Gibson, MD  This document serves as a record of services personally performed by Eppie Gibson, MD. It was created on her behalf by Roney Mans, a trained medical scribe. The creation of this record is based on the scribe's personal observations and the provider's statements to them. This document has been checked and approved by the attending provider.

## 2021-11-09 NOTE — Progress Notes (Signed)
[facility] [place], [place] [address] [phone]   New Patient Evaluation  Date of Service: 11/09/21 Patient Name: Jeff Wood Patient MRN: [id number] Patient DOB: 1956/07/01 Provider: Ventura Sellers, MD  Identifying Statement:  Jeff Payment Sr. is a 66 y.o. male with left temporal glioblastoma who presents for initial consultation and evaluation.    Referring Provider: Earnie Larsson, MD [place]. [address] [place],  [place] [address]  Oncologic History: Oncology History  Glioblastoma, IDH-wildtype [facility])  10/20/2021 Surgery   Craniotomy, resection of L temporal mass by Dr. Annette Stable.  Path demonstrates Glioblastoma IDHwt.   10/27/2021 Initial Diagnosis   Glioblastoma, IDH-wildtype ([place])     Biomarkers:  MGMT Unknown.  IDH 1/2 Wild type.  EGFR Unknown  TERT Unknown   History of Present Illness: The patient's records from the referring physician were obtained and reviewed and the patient interviewed to confirm this HPI.  Jeff Vivianne Master Sr. Presented to medical attention in mid-February with sudden onset inability to speak or communicate.  Patient had been in normal state of health just prior.  In ED, workup revealed enhancing mass in the left temporal lobe consistent with primary brain tumor.  He underwent craniotomy, resection with Dr. Annette Stable on 10/20/21; path demonstrated glioblastoma.  Following surgery, there were deficits with language, right sided weakness, prompting rehab admission.  Since discharge from rehab, there have been issues with irritability, anger, mood swings, poor sleep.  He is dosing decadron $RemoveBeforeD'2mg'FPViVRLYcmQhUC$  twice per day, Keppra $RemoveBef'1000mg'vzriUnRyek$  twice per day.  Walking mostly on his own, though does use a walker at times.  He is frustrated about not driving or shooting his guns.  Currently living with his wife and daughter who just moved down to [place].   Medications: Current Outpatient Medications on  File Prior to Visit  Medication Sig Dispense Refill   acetaminophen (TYLENOL) 325 MG tablet Take 2 tablets (650 mg total) by mouth every 6 (six) hours as needed for mild pain (or Fever >/= 101).     albuterol (VENTOLIN HFA) 108 (90 Base) MCG/ACT inhaler Inhale 1-2 puffs into the lungs every 6 (six) hours as needed for wheezing or shortness of breath. 8.5 g 0   apixaban (ELIQUIS) 5 MG TABS tablet Take 1 tablet (5 mg total) by mouth 2 (two) times daily. 60 tablet 0   atorvastatin (LIPITOR) 40 MG tablet Take 1 tablet (40 mg total) by mouth daily. 30 tablet 0   Cholecalciferol (VITAMIN D3 GUMMIES PO) Take 2 tablets by mouth every evening.     dexamethasone (DECADRON) 2 MG tablet Take 1 tablet (2 mg total) by mouth 2 (two) times daily. 60 tablet 0   diclofenac Sodium (VOLTAREN) 1 % GEL Apply 2 g topically 4 (four) times daily. 200 g 0   famotidine (PEPCID) 20 MG tablet Take 1 tablet (20 mg total) by mouth daily. 30 tablet 0   fesoterodine (TOVIAZ) 4 MG TB24 tablet Take 1 tablet (4 mg total) by mouth daily. 30 tablet 0   fluticasone (FLONASE) 50 MCG/ACT nasal spray Place 2 sprays into both nostrils in the morning and at bedtime.     Fluticasone-Umeclidin-Vilant (TRELEGY ELLIPTA IN) Inhale 1 puff into the lungs daily.     gemfibrozil (LOPID) 600 MG tablet Take 1 tablet (600 mg total) by mouth 2 (two) times daily before a meal. 60 tablet 0   insulin glargine (LANTUS) 100 UNIT/ML Solostar Pen Inject 30  Units into the skin 2 (two) times daily. 15 mL 11   Insulin Pen Needle (PEN NEEDLES) 32G X 4 MM MISC Use as directed 100 each 0   isosorbide mononitrate (IMDUR) 30 MG 24 hr tablet Take 1 tablet (30 mg total) by mouth daily. 30 tablet 0   levETIRAcetam (KEPPRA) 500 MG tablet Take 1 tablet (500 mg total) by mouth 2 (two) times daily. 60 tablet 0   metoprolol tartrate (LOPRESSOR) 100 MG tablet Take 1 tablet (100 mg total) by mouth 2 (two) times daily. 60 tablet 0   montelukast (SINGULAIR) 10 MG tablet Take 1  tablet (10 mg total) by mouth every evening. 30 tablet 0   Multiple Vitamins-Minerals (MENS MULTIVITAMIN PO) Take 1 tablet by mouth daily.     nitroGLYCERIN (NITROSTAT) 0.4 MG SL tablet Place 0.4 mg under the tongue every 5 (five) minutes as needed for chest pain.     oxyCODONE-acetaminophen (PERCOCET/ROXICET) 5-325 MG tablet Take 2 tablets by mouth every 6 (six) hours as needed for severe pain. 30 tablet 0   QUEtiapine (SEROQUEL) 200 MG tablet Take 1 tablet (200 mg total) by mouth at bedtime. 30 tablet 0   topiramate (TOPAMAX) 25 MG tablet Take 1 tablet (25 mg total) by mouth at bedtime. 30 tablet 0   insulin aspart (NOVOLOG FLEXPEN) 100 UNIT/ML FlexPen Inject 8 Units into the skin 3 (three) times daily with meals. (Patient not taking: Reported on 11/09/2021) 15 mL 11   No current facility-administered medications on file prior to visit.    Allergies:  Allergies  Allergen Reactions   Iodine Anaphylaxis   Shellfish Allergy Anaphylaxis   Past Medical History:  Past Medical History:  Diagnosis Date   Morbid obesity ([place]) 10/17/2021   Past Surgical History:  Past Surgical History:  Procedure Laterality Date   APPLICATION OF CRANIAL NAVIGATION Left 10/20/2021   Procedure: APPLICATION OF CRANIAL NAVIGATION;  Surgeon: Earnie Larsson, MD;  Location: [facility];  Service: Neurosurgery;  Laterality: Left;   CRANIOTOMY Left 10/20/2021   Procedure: Left temporal craniotomy for tumor with Brain Lab;  Surgeon: Earnie Larsson, MD;  Location: [facility];  Service: Neurosurgery;  Laterality: Left;   RADIOLOGY WITH ANESTHESIA N/A 10/18/2021   Procedure: MRI WITH ANESTHESIA;  Surgeon: Radiologist, Medication, MD;  Location: [facility];  Service: Radiology;  Laterality: N/A;   Social History:  Social History   Socioeconomic History   Marital status: Married    Spouse name: Not on file   Number of children: Not on file   Years of education: Not on file   Highest education level: Not on file  Occupational History   Not  on file  Tobacco Use   Smoking status: Never   Smokeless tobacco: Never  Vaping Use   Vaping Use: Never used  Substance and Sexual Activity   Alcohol use: Not Currently   Drug use: Never   Sexual activity: Not Currently  Other Topics Concern   Not on file  Social History Narrative   Not on file   Social Determinants of Health   Financial Resource Strain: Not on file  Food Insecurity: Not on file  Transportation Needs: Not on file  Physical Activity: Not on file  Stress: Not on file  Social Connections: Not on file  Intimate Partner Violence: Not on file   Family History: No family history on file.  Review of Systems: Limited by dyphasia  Physical Exam: Vitals:   11/09/21 1456  BP: (!) 188/86  Pulse: 65  Resp: 18  Temp: 97.6 F (36.4 C)  SpO2: 99%   KPS: 70. General: Obese habitus Head: Normal EENT: No conjunctival injection or scleral icterus.  Lungs: Resp effort normal Cardiac: Regular rate Abdomen: Non-distended abdomen Skin: No rashes cyanosis or petechiae. Extremities: No clubbing or edema  Neurologic Exam: Mental Status: Awake, alert, attentive to examiner. Oriented to self and environment. Language is impaired with regards to comprehension, word salad noted.  Fluency is mostly preserved.  Poor insight into nature of condition. Cranial Nerves: Visual acuity is grossly normal. Visual fields are full. Extra-ocular movements intact. No ptosis. Face is symmetric Motor: Tone and bulk are normal. Power is 4+/5 in right arm and leg. Reflexes are symmetric, no pathologic reflexes present.  Sensory: Intact to light touch Gait: Walker assisted   Labs: I have reviewed the data as listed    Component Value Date/Time   NA 134 (L) 11/04/2021 0859   K 3.7 11/04/2021 0859   CL 102 11/04/2021 0859   CO2 23 11/04/2021 0859   GLUCOSE 135 (H) 11/04/2021 0859   BUN 23 11/04/2021 0859   CREATININE 0.92 11/04/2021 0859   CALCIUM 8.9 11/04/2021 0859   PROT 6.6  10/27/2021 1718   ALBUMIN 3.1 (L) 10/27/2021 1718   AST 57 (H) 10/27/2021 1718   ALT 56 (H) 10/27/2021 1718   ALKPHOS 74 10/27/2021 1718   BILITOT 0.7 10/27/2021 1718   GFRNONAA >60 11/04/2021 0859   Lab Results  Component Value Date   WBC 9.6 11/04/2021   NEUTROABS 11.6 (H) 10/27/2021   HGB 13.4 11/04/2021   HCT 39.7 11/04/2021   MCV 95.0 11/04/2021   PLT 163 11/04/2021    Imaging: CT HEAD WO CONTRAST (5MM)  Result Date: 11/04/2021 CLINICAL DATA:  Mental status change. EXAM: CT HEAD WITHOUT CONTRAST TECHNIQUE: Contiguous axial images were obtained from the base of the skull through the vertex without intravenous contrast. RADIATION DOSE REDUCTION: This exam was performed according to the departmental dose-optimization program which includes automated exposure control, adjustment of the mA and/or kV according to patient size and/or use of iterative reconstruction technique. COMPARISON:  October 27, 2021 FINDINGS: Brain: Stable postsurgical changes from posterior left temporal lobe resection containing some gas and fluid. The previously demonstrated adjacent parenchymal hemorrhage is not well seen on today's study. Regional hypoattenuation likely represents edema is not significantly changed. 4 mm midline shift to the right. Stable mass effect. The ventricular size is stable. Vascular: No hyperdense vessel or unexpected calcification. Skull: 6 left lateral craniotomy. Sinuses/Orbits: No acute finding. Other: Postsurgical changes in the left scalp. IMPRESSION: Stable postsurgical changes from posterior left temporal lobe resection containing some gas and fluid. The previously demonstrated adjacent parenchymal hemorrhage is not well seen on today's study. Stable mass effect and 4 mm midline shift to the right. Electronically Signed   By: Fidela Salisbury M.D.   On: 11/04/2021 18:44   CT HEAD WO CONTRAST (5MM)  Result Date: 10/27/2021 CLINICAL DATA:  66 year old male postoperative day 7 right  posterior temporal lobe craniotomy and tumor resection. Pathology pending. EXAM: CT HEAD WITHOUT CONTRAST TECHNIQUE: Contiguous axial images were obtained from the base of the skull through the vertex without intravenous contrast. RADIATION DOSE REDUCTION: This exam was performed according to the departmental dose-optimization program which includes automated exposure control, adjustment of the mA and/or kV according to patient size and/or use of iterative reconstruction technique. COMPARISON:  Postoperative head CT 10/21/2021 and earlier. FINDINGS: Brain: Posterior left temporal lobe resection cavity containing  some gas and fluid has not significantly changed in size or configuration since 10/21/2021. Small round foci of regional hemorrhage are also stable (series 3, images 19 and 22, up to 13 mm individually). Regional white matter hypodensity appears mildly regressed. Trace left side subdural pneumocephalus persists. Stable trace rightward midline shift. Mild mass effect on the atrium of the left lateral ventricle is stable. No ventriculomegaly. Basilar cisterns remain patent. No new intracranial hemorrhage or evidence of cortically based acute infarction. Vascular: Mild Calcified atherosclerosis at the skull base. Skull: Stable left lateral craniotomy. No acute osseous abnormality identified. Sinuses/Orbits: Visualized paranasal sinuses and mastoids are stable and well aerated. Other: Evolving postoperative changes to the left scalp. Skin staples remain in place. Negative orbits soft tissues. IMPRESSION: 1. Essentially stable CT appearance of the posterior left temporal resection. Stable resection cavity with small volume adjacent parenchymal blood. Unchanged mild regional mass effect. 2. No new intracranial abnormality. Electronically Signed   By: Genevie Ann M.D.   On: 10/27/2021 04:50   CT HEAD WO CONTRAST (5MM)  Result Date: 10/21/2021 CLINICAL DATA:  Brain neoplasm status post resection EXAM: CT HEAD  WITHOUT CONTRAST TECHNIQUE: Contiguous axial images were obtained from the base of the skull through the vertex without intravenous contrast. RADIATION DOSE REDUCTION: This exam was performed according to the departmental dose-optimization program which includes automated exposure control, adjustment of the mA and/or kV according to patient size and/or use of iterative reconstruction technique. COMPARISON:  Brain MRI 10/18/2021 FINDINGS: Brain: There has been interval left temporoparietal craniotomy for mass resection. The resection cavity in the temporal lobe measures approximately 3.9 cm TV x 1.7 cm AP x 2.1 cm cc. There is a small amount of blood products at the periphery of the cavity. An additional focus of intraparenchymal or subarachnoid blood is seen superior to the resection cavity measuring 1.5 cm by 1.1 cm. There is a small amount of extra-axial fluid, blood, and air overlying the resection site measuring up to approximately 3 mm in thickness in the coronal plane. A small amount of pneumocephalus is also seen overlying the left frontal lobe. There is mild edema surrounding the resection cavity and this additional focus of blood resulting in partial effacement of the left temporal horn and approximally 2 mm rightward midline shift. The remainder of the brain parenchyma is within normal limits. There is no evidence of acute territorial infarct. Vascular: No hyperdense vessel or unexpected calcification. Skull: Postsurgical changes as above. Sinuses/Orbits: The paranasal sinuses are clear. The globes and orbits are unremarkable. Other: There is expected postoperative soft tissue swelling with fluid and gas in the left scalp. IMPRESSION: 1. Postsurgical changes reflecting interval left temporal lobe mass resection with a small amount of blood products at the periphery of the cavity as well as intraparenchymal versus subarachnoid hemorrhage superior to the resection cavity. Edema around the resection cavity  and blood products results in partial effacement of the left temporal horn and trace rightward midline shift. 2. No evidence of acute territorial infarct. Electronically Signed   By: Valetta Mole M.D.   On: 10/21/2021 08:46   MR BRAIN WO CONTRAST  Result Date: 10/16/2021 CLINICAL DATA:  Altered mental status EXAM: MRI HEAD WITHOUT CONTRAST TECHNIQUE: Multiplanar, multiecho pulse sequences of the brain and surrounding structures were obtained without intravenous contrast. COMPARISON:  None. FINDINGS: Truncated examination due to patient mental status and inability to tolerate the full examination. Only diffusion-weighted imaging was obtained. There is a mass lesion of the left temporal lobe  with associated edema. No midline shift. No other mass is visible on diffusion-weighted imaging. IMPRESSION: 1. Truncated examination due to patient mental status and inability to tolerate the full examination. 2. Mass lesion of the left temporal lobe with associated edema. When possible, a complete MRI with the administration of intravenous contrast is recommended. Electronically Signed   By: Ulyses Jarred M.D.   On: 10/16/2021 03:56   MR BRAIN W WO CONTRAST  Result Date: 10/18/2021 CLINICAL DATA:  Initial evaluation for brain/CNS neoplasm, staging. EXAM: MRI HEAD WITHOUT AND WITH CONTRAST TECHNIQUE: Multiplanar, multiecho pulse sequences of the brain and surrounding structures were obtained without and with intravenous contrast. CONTRAST:  31mL GADAVIST GADOBUTROL 1 MMOL/ML IV SOLN COMPARISON:  Prior CTs from 10/15/2021. FINDINGS: Brain: Cerebral volume within normal limits. Mild scattered patchy T2/FLAIR hyperintensity noted involving the periventricular and deep white matter both cerebral hemispheres, nonspecific, but most like related chronic microvascular ischemic disease, overall mild in nature in felt to be within normal limits for age. No evidence for acute or subacute infarct. No areas of chronic cortical  infarction. No acute intracranial hemorrhage. Rounded mass positioned at the left temporal lobe measures 2.4 x 2.7 x 2.8 cm (AP by transverse by craniocaudad). Lesion demonstrates hyperintense T2 signal intensity with avid heterogeneous and predominantly peripheral postcontrast enhancement. Speckled areas of internal susceptibility artifact consistent with necrosis and/or blood products. Surrounding T2/FLAIR signal intensity consistent with vasogenic edema and/or infiltrating tumor. Mild mass effect on the atrium of the left lateral ventricle without significant midline shift. 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 No other mass lesion or abnormal enhancement. No hydrocephalus or ventricular trapping. No extra-axial fluid collection. Pituitary gland suprasellar region normal. Midline structures normal. Vascular: Major intracranial vascular flow voids are maintained. Skull and upper cervical spine: Craniocervical junction within normal limits. Bone marrow signal intensity within normal limits. No focal marrow replacing lesion. No scalp soft tissue abnormality. Sinuses/Orbits: Globes and orbital soft tissues demonstrate no acute finding. Scattered mucosal thickening noted within the ethmoidal air cells. Fluid seen layering within the nasopharynx. Small bilateral mastoid effusions. Patient is intubated. Other: None. IMPRESSION: 1. 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 Surrounding vasogenic edema and/or infiltrating tumor without significant midline shift. 2. No other acute intracranial abnormality. Electronically Signed   By: Jeannine Boga M.D.   On: 10/18/2021 19:49   CT CEREBRAL PERFUSION W CONTRAST  Result Date: 10/15/2021 CLINICAL DATA:  Neuro deficit, acute, stroke suspected. EXAM: CT ANGIOGRAPHY HEAD AND NECK CT PERFUSION BRAIN TECHNIQUE: Multidetector CT imaging of the head  and neck was performed using the standard protocol during bolus administration of intravenous contrast. Multiplanar CT image reconstructions and MIPs were obtained to evaluate the vascular anatomy. Carotid stenosis measurements (when applicable) are obtained utilizing NASCET criteria, using the distal internal carotid diameter as the denominator. Multiphase CT imaging of the brain was performed following IV bolus contrast injection. Subsequent parametric perfusion maps were calculated using RAPID software. RADIATION DOSE REDUCTION: This exam was performed according to the departmental dose-optimization program which includes automated exposure control, adjustment of the mA and/or kV according to patient size and/or use of iterative reconstruction technique. CONTRAST:  Not annotated COMPARISON:  Head CT earlier same day FINDINGS: CTA NECK FINDINGS Aortic arch: Aortic atherosclerosis, mild. Right carotid system: Common carotid artery widely patent to the bifurcation. Carotid bifurcation is normal. Left carotid system: Common carotid artery widely  patent to the bifurcation. Carotid bifurcation is normal. Vertebral arteries: Vertebral artery origins are poorly seen. Beyond the origins, the vessels are patent through the cervical region to the foramen magnum. Skeleton: Ordinary cervical spondylosis. Other neck: No mass or lymphadenopathy. Upper chest: Small amount of dependent pleural fluid and dependent pulmonary atelectasis. Review of the MIP images confirms the above findings CTA HEAD FINDINGS Anterior circulation: Both internal carotid arteries widely patent through the skull base and siphon regions. The anterior and middle cerebral vessels are patent. No large vessel occlusion. Posterior circulation: Both vertebral arteries are widely patent to the basilar. No basilar stenosis. No large vessel occlusion. Venous sinuses: Patent and normal. Anatomic variants: None significant. Review of the MIP images confirms the  above findings CT Brain Perfusion Findings: ASPECTS: 9 CBF (<30%) Volume: 92mL Perfusion (Tmax>6.0s) volume: 41mL Mismatch Volume: 26mL Infarction Location:None There is actual hyperemia in the left posterior temporal lobe, with a peripherally enhancing mass lesion visible, measuring about 2.5 cm in size. IMPRESSION: No large vessel occlusion. 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 Electronically Signed   By: Nelson Chimes M.D.   On: 10/15/2021 22:35   DG CHEST PORT 1 VIEW  Result Date: 10/24/2021 CLINICAL DATA:  Cough EXAM: PORTABLE CHEST 1 VIEW COMPARISON:  10/16/2021 FINDINGS: Transverse diameter of heart is increased. There is interval increase in interstitial markings in the left parahilar region and left lower lung fields. Right lung is unremarkable. There is poor inspiration. There is minimal blunting of left lateral CP angle. There is no pneumothorax. Tip of right IJ chest port is seen in the superior vena cava. IMPRESSION: Cardiomegaly. There is interval increase in interstitial markings in the left parahilar region and left lower lung fields suggesting possible interstitial pneumonia. Electronically Signed   By: Elmer Picker M.D.   On: 10/24/2021 10:38   DG CHEST PORT 1 VIEW  Result Date: 10/16/2021 CLINICAL DATA:  COVID-19 infection. EXAM: PORTABLE CHEST 1 VIEW COMPARISON:  None. FINDINGS: The heart is enlarged and the mediastinal contour is within normal limits. Lung volumes are low. No consolidation, effusion, or pneumothorax. A right chest port terminates over the superior vena cava. No acute osseous abnormality. IMPRESSION: 1. Low lung volumes with no acute process. 2. Cardiomegaly. Electronically Signed   By: Brett Fairy M.D.   On: 10/16/2021 04:10   EEG adult  Result Date: 10/16/2021 Greta Doom, MD     10/16/2021  1:46 PM History: 66 yo M with AMS with left temporal mass Sedation: none Technique: This EEG was acquired with  electrodes placed according to the International 10-20 electrode system (including Fp1, Fp2, F3, F4, C3, C4, P3, P4, O1, O2, T3, T4, T5, T6, A1, A2, Fz, Cz, Pz). The following electrodes were missing or displaced: none. Background: There is a posterior dominant rhythm of 9 Hz. There is focal irregular delta activity maximal in the left temporal region. In addition, there are occasioanl left temporal sharp waves, T7, P7 > F7 Photic stimulation: Physiologic driving is not performed. EEG Abnormalities: 1) Left temporal sharp waves 2) Left temporal slow activity Clinical Interpretation: This EEG is consistent with an area of potential epileptogenicity in the left temporal region. There was no seizure recorded on this study. Please note that lack of epileptiform activity on EEG does not preclude the possibility of epilepsy. Roland Rack, MD [facility] [phone] If 7pm- 7am, please page neurology on call as listed in [facility].   Overnight EEG  with video  Result Date: 10/19/2021 Lora Havens, MD     10/19/2021  3:26 PM Patient Name: Jeff Minor Sr. MRN: [id number] Epilepsy Attending: Lora Havens Referring Physician/Provider: Dr Su Monks Duration: 10/18/2021 2129 to 10/19/2021 1154 Patient history: 66 year old male with altered mental status and left temporal mass.  EEG to evaluate for seizure Level of alertness: Awake, asleep AEDs during EEG study: LEV Technical aspects: This EEG study was done with scalp electrodes positioned according to the 10-20 International system of electrode placement. Electrical activity was acquired at a sampling rate of $Remov'500Hz'WvnycZ$  and reviewed with a high frequency filter of $RemoveB'70Hz'UnsjOjgF$  and a low frequency filter of $RemoveB'1Hz'fccmjbEk$ . EEG data were recorded continuously and digitally stored. Description: The posterior dominant rhythm consists of 9 Hz activity of moderate voltage (25-35 uV) seen predominantly in posterior head regions, symmetric and reactive to eye opening and eye  closing. Sleep was characterized by vertex waves, sleep spindles (12 to 14 Hz), maximal frontocentral region.  EEG showed intermittent 3 to 5 Hz theta-delta slowing in left temporal region which at times appears rhythmic without definite evolution consistent with lateralized rhythmic delta activity.  Intermittent generalized 3 to 5 Hz theta and delta slowing was also noted. Hyperventilation and photic stimulation were not performed.   ABNORMALITY -Lateralized rhythmic delta activity, left temporal region -Intermittent slow, generalized IMPRESSION: This study is suggestive of cortical dysfunction in left temporal region which is likely secondary to underlying mass.  Of note, lateralized rhythmic activity is on the ictal-interictal continuum with low potential for seizures.  Additionally there is mild to moderate diffuse encephalopathy, nonspecific etiology.  No definite seizures or epileptiform discharges were seen throughout the recording. Lora Havens   CT HEAD CODE STROKE WO CONTRAST  Result Date: 10/15/2021 CLINICAL DATA:  Code stroke. Neuro deficit, acute, stroke suspected. EXAM: CT HEAD WITHOUT CONTRAST TECHNIQUE: Contiguous axial images were obtained from the base of the skull through the vertex without intravenous contrast. RADIATION DOSE REDUCTION: This exam was performed according to the departmental dose-optimization program which includes automated exposure control, adjustment of the mA and/or kV according to patient size and/or use of iterative reconstruction technique. COMPARISON:  None. FINDINGS: Brain: The brainstem and cerebellum are normal. Right cerebral hemisphere is normal. There is a 3-4 cm region of abnormal low-density and mild swelling in the left mid to posterior temporal lobe. This could represent a early subacute infarction mass lesion is not excluded. There may be some internal petechial blood products but there is no frank hematoma. Elsewhere the brain appears normal. No  hydrocephalus or extra-axial collection. Vascular: No abnormal vascular finding. Skull: Negative Sinuses/Orbits: Clear/normal Other: None ASPECTS ([place] Stroke Program Early CT Score) - Ganglionic level infarction (caudate, lentiform nuclei, internal capsule, insula, M1-M3 cortex): 6 - Supraganglionic infarction (M4-M6 cortex): 3 Total score (0-10 with 10 being normal): 9 IMPRESSION: 1. Question 3-4 cm region of subacute infarction versus possibly a mass lesion in the left posterior temporal lobe. There may be some internal petechial blood products but there is no frank hematoma. 2. ASPECTS is 9 3. Case discussed by telephone with Dr. Lorrin Goodell at 2200 hours. Electronically Signed   By: Nelson Chimes M.D.   On: 10/15/2021 22:06   CT ANGIO HEAD NECK W WO CM (CODE STROKE)  Result Date: 10/15/2021 CLINICAL DATA:  Neuro deficit, acute, stroke suspected. EXAM: CT ANGIOGRAPHY HEAD AND NECK CT PERFUSION BRAIN TECHNIQUE: Multidetector CT imaging of the head and neck was performed using the standard protocol  during bolus administration of intravenous contrast. Multiplanar CT image reconstructions and MIPs were obtained to evaluate the vascular anatomy. Carotid stenosis measurements (when applicable) are obtained utilizing NASCET criteria, using the distal internal carotid diameter as the denominator. Multiphase CT imaging of the brain was performed following IV bolus contrast injection. Subsequent parametric perfusion maps were calculated using RAPID software. RADIATION DOSE REDUCTION: This exam was performed according to the departmental dose-optimization program which includes automated exposure control, adjustment of the mA and/or kV according to patient size and/or use of iterative reconstruction technique. CONTRAST:  Not annotated COMPARISON:  Head CT earlier same day FINDINGS: CTA NECK FINDINGS Aortic arch: Aortic atherosclerosis, mild. Right carotid system: Common carotid artery widely patent to the bifurcation.  Carotid bifurcation is normal. Left carotid system: Common carotid artery widely patent to the bifurcation. Carotid bifurcation is normal. Vertebral arteries: Vertebral artery origins are poorly seen. Beyond the origins, the vessels are patent through the cervical region to the foramen magnum. Skeleton: Ordinary cervical spondylosis. Other neck: No mass or lymphadenopathy. Upper chest: Small amount of dependent pleural fluid and dependent pulmonary atelectasis. Review of the MIP images confirms the above findings CTA HEAD FINDINGS Anterior circulation: Both internal carotid arteries widely patent through the skull base and siphon regions. The anterior and middle cerebral vessels are patent. No large vessel occlusion. Posterior circulation: Both vertebral arteries are widely patent to the basilar. No basilar stenosis. No large vessel occlusion. Venous sinuses: Patent and normal. Anatomic variants: None significant. Review of the MIP images confirms the above findings CT Brain Perfusion Findings: ASPECTS: 9 CBF (<30%) Volume: 16mL Perfusion (Tmax>6.0s) volume: 45mL Mismatch Volume: 12mL Infarction Location:None There is actual hyperemia in the left posterior temporal lobe, with a peripherally enhancing mass lesion visible, measuring about 2.5 cm in size. IMPRESSION: No large vessel occlusion. 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 Electronically Signed   By: Nelson Chimes M.D.   On: 10/15/2021 22:35    Pathology: SURGICAL PATHOLOGY  CASE: [id number]  PATIENT: Jeff Wood  Surgical Pathology Report   Clinical History: brain tumor (cm)   FINAL MICROSCOPIC DIAGNOSIS:   A and B. BRAIN TUMOR, LEFT TEMPORAL, RESECTION:  -  Glioblastoma, IDH-wild-type, WHO grade 4  -  See comment   COMMENT:   The above diagnosis and following comment were provided by Dr. Linton Rump  from [facility] hospital:   "Sections reveal a densely cellular infiltrating glial neoplasm  composed  of cells with enlarged and irregularly shaped hyperchromatic nuclei  embedded within a fibrillar background.  The tumor has a high mitotic  index and contains areas of necrosis with admixed neutrophils and small  foci of microvascular proliferation.   Immunohistochemical stains were performed at [facility] and show the tumor  cells are immunoreactive for OLIG2 and negative for IDH1 R132H mutant  protein.  ATRX expression is retained and p53 labels a minority of tumor  cells.  The Ki-67 labeling index is moderate.  Synaptophysin and SMM 31  highlight entrapped axons.   Overall, the findings are those of glioblastoma, IDH-wild-type, WHO  grade 4.    Assessment/Plan Glioblastoma, IDH-wildtype ([place]) - Plan: MR BRAIN W WO CONTRAST, Valproic acid level  Seizure ([place])  Aphasia syndrome  We appreciate the opportunity to participate in the care of [employer] WoodMarland Kitchen  He is clinically improved today with regards to language and motor function, following inpatient rehab course.  Main limitation currently is mood lability, irritability, social dysfunction  with caregivers.  We can attribute this in part to both dexamethasone and keppra.  We had an extensive conversation with him and his family regarding pathology, prognosis, and available treatment pathways, including goals of care.  We do not currently possess a post-op MRI study, which limits assessment of surgical quality and prognosis.  Language and motor deficits, as well as baseline co-morbidities, will limit him prognostically.   We ultimately recommended proceeding with course of intensity modulated radiation therapy and concurrent daily Temozolomide.  Radiation will be administered Mon-Fri over 6 weeks, Temodar will be dosed at $Remove'75mg'swNjCuz$ /m2 to be given daily over 42 days.  We reviewed side effects of temodar, including fatigue, nausea/vomiting, constipation, and cytopenias.  Informed consent was verbally obtained at bedside to proceed with  oral chemotherapy.  Chemotherapy should be held for the following:  ANC less than 1,000  Platelets less than 100,000  LFT or creatinine greater than 2x ULN  If clinical concerns/contraindications develop  Every 2 weeks during radiation, labs will be checked accompanied by a clinical evaluation in the brain tumor clinic.  Decadron should decrease to $RemoveBef'2mg'VRrjmrOmKk$  daily x5 days, then stop if tolerated.  Keppra has been poorly tolerated, and will be changed out for Depakote.  He will load with $RemoveB'2000mg'toYGwmHO$  tonight, then begin with $RemoveBe'1000mg'RORmmXVto$  BID dosing starting tomorrow AM.  We will check a depakote level in the clinic in 1 week.    Ok with discontinuing Topamax, Toviaz.  He will limit opiates to as needed only, as prior.    Screening for potential clinical trials was performed and discussed using eligibility criteria for active protocols at [facility], loco-regional tertiary centers, as well as national database available on [email or web address].    The patient is not a candidate for a research protocol at this time due to poor functional status.   He will meet with radiation oncology tomorrow for further discussion of radiation.  He will need an updated MRI study.  We will bring him back to clinic in 1 week to review scan, assess response to medication adjustments made today, and continue goals of care discussion.  We spent twenty additional minutes teaching regarding the natural history, biology, and historical experience in the treatment of brain tumors. We then discussed in detail the current recommendations for therapy focusing on the mode of administration, mechanism of action, anticipated toxicities, and quality of life issues associated with this plan. We also provided teaching sheets for the patient to take home as an additional resource.  All questions were answered. The patient knows to call the clinic with any problems, questions or concerns. No barriers to learning were detected.  The total time  spent in the encounter was 60 minutes and more than 50% was on counseling and review of test results   Ventura Sellers, MD Medical Director of Neuro-Oncology [facility] at [place] 11/09/21 3:12 PM

## 2021-11-10 ENCOUNTER — Ambulatory Visit
Admission: RE | Admit: 2021-11-10 | Discharge: 2021-11-10 | Disposition: A | Discharge status: Home / Self Care | Payer: Medicare Other | Source: Ambulatory Visit | Attending: Radiation Oncology | Admitting: Radiation Oncology

## 2021-11-10 ENCOUNTER — Telehealth: Payer: Self-pay | Admitting: Internal Medicine

## 2021-11-10 ENCOUNTER — Encounter: Payer: Self-pay | Admitting: Radiation Oncology

## 2021-11-10 VITALS — BP 153/79 | HR 65 | Temp 97.3°F | Resp 18 | Ht 70.0 in | Wt 256.4 lb

## 2021-11-10 DIAGNOSIS — B370 Candidal stomatitis: Secondary | ICD-10-CM | POA: Insufficient documentation

## 2021-11-10 DIAGNOSIS — Z9981 Dependence on supplemental oxygen: Secondary | ICD-10-CM | POA: Insufficient documentation

## 2021-11-10 DIAGNOSIS — G9340 Encephalopathy, unspecified: Secondary | ICD-10-CM | POA: Insufficient documentation

## 2021-11-10 DIAGNOSIS — Z8616 Personal history of COVID-19: Secondary | ICD-10-CM | POA: Diagnosis not present

## 2021-11-10 DIAGNOSIS — C712 Malignant neoplasm of temporal lobe: Secondary | ICD-10-CM

## 2021-11-10 DIAGNOSIS — Z79899 Other long term (current) drug therapy: Secondary | ICD-10-CM | POA: Insufficient documentation

## 2021-11-10 DIAGNOSIS — Z7901 Long term (current) use of anticoagulants: Secondary | ICD-10-CM | POA: Insufficient documentation

## 2021-11-10 DIAGNOSIS — Z7952 Long term (current) use of systemic steroids: Secondary | ICD-10-CM | POA: Insufficient documentation

## 2021-11-10 DIAGNOSIS — M47812 Spondylosis without myelopathy or radiculopathy, cervical region: Secondary | ICD-10-CM | POA: Diagnosis not present

## 2021-11-10 DIAGNOSIS — J449 Chronic obstructive pulmonary disease, unspecified: Secondary | ICD-10-CM | POA: Insufficient documentation

## 2021-11-10 DIAGNOSIS — C719 Malignant neoplasm of brain, unspecified: Secondary | ICD-10-CM

## 2021-11-10 MED ORDER — DIAZEPAM 5 MG PO TABS: ORAL_TABLET | ORAL | 0 refills | Status: AC

## 2021-11-10 MED ORDER — FLUCONAZOLE 100 MG PO TABS: ORAL_TABLET | ORAL | 0 refills | Status: AC

## 2021-11-10 NOTE — Telephone Encounter (Signed)
Scheduled per 3/13 los, message has been left with pt ?

## 2021-11-11 ENCOUNTER — Other Ambulatory Visit (HOSPITAL_BASED_OUTPATIENT_CLINIC_OR_DEPARTMENT_OTHER): Payer: Self-pay

## 2021-11-11 ENCOUNTER — Ambulatory Visit (HOSPITAL_COMMUNITY): Payer: Medicare Other

## 2021-11-11 ENCOUNTER — Telehealth (HOSPITAL_BASED_OUTPATIENT_CLINIC_OR_DEPARTMENT_OTHER): Payer: Self-pay

## 2021-11-11 ENCOUNTER — Other Ambulatory Visit (HOSPITAL_COMMUNITY): Payer: Self-pay

## 2021-11-11 NOTE — Telephone Encounter (Signed)
Pharmacy Transitions of Care Follow-up Telephone Call ? ?Date of discharge: 11/06/21  ?Discharge Diagnosis: Glioblastoma  ? ?How have you been since you were released from the hospital? Spoke with pt's wife. States pt is not doing well after hospital visit due to his diagnosis. She endorses that he is continuing some medications but the oncologist has taken pt off of some of them. She states she is aware of ADEs of Eliquis and would like husband to continue medication management at [facility].  ? ?Medication changes made at discharge: ?CONTINUE taking these medications ? ?CONTINUE taking these medications  ?acetaminophen 325 MG tablet ?Commonly known as: TYLENOL ?Take 2 tablets (650 mg total) by mouth every 6 (six) hours as needed for mild pain (or Fever >/= 101).  ?albuterol 108 (90 Base) MCG/ACT inhaler ?Commonly known as: VENTOLIN HFA ?Inhale 1-2 puffs into the lungs every 6 (six) hours as needed for wheezing or shortness of breath.  ?atorvastatin 40 MG tablet ?Commonly known as: LIPITOR ?Take 1 tablet (40 mg total) by mouth daily.  ?Basaglar KwikPen 100 UNIT/ML ?Inject 30 Units into the skin 2 (two) times daily.  ?dexamethasone 2 MG tablet ?Commonly known as: DECADRON ?Take 1 tablet (2 mg total) by mouth daily.  ?diazepam 5 MG tablet ?Commonly known as: VALIUM ?Take 1 tablet by mouth 45 minutes prior to MRI scans.  ?diclofenac Sodium 1 % Gel ?Commonly known as: VOLTAREN ?Apply 2 g topically 4 (four) times daily.  ?divalproex 500 MG DR tablet ?Commonly known as: Depakote ?Take 2 tablets (1,000 mg total) by mouth 2 (two) times daily.  ?Eliquis 5 MG Tabs tablet ?Generic drug: apixaban ?Take 1 tablet (5 mg total) by mouth 2 (two) times daily.  ?famotidine 20 MG tablet ?Commonly known as: PEPCID ?Take 1 tablet (20 mg total) by mouth daily.  ?fesoterodine 4 MG Tb24 tablet ?Commonly known as: TOVIAZ ?Take 1 tablet (4 mg total) by mouth daily.  ?fluconazole 100 MG tablet ?Commonly known as: DIFLUCAN ?Take 2 tablets today,  then 1 tablet daily x 13 more days. Hold Atorvastatin while on this drug.  ?fluticasone 50 MCG/ACT nasal spray ?Commonly known as: FLONASE  ?gemfibrozil 600 MG tablet ?Commonly known as: LOPID ?Take 1 tablet (600 mg total) by mouth 2 (two) times daily before a meal.  ?isosorbide mononitrate 30 MG 24 hr tablet ?Commonly known as: IMDUR ?Take 1 tablet (30 mg total) by mouth daily.  ?MENS MULTIVITAMIN PO  ?metoprolol tartrate 100 MG tablet ?Commonly known as: LOPRESSOR ?Take 1 tablet (100 mg total) by mouth 2 (two) times daily.  ?montelukast 10 MG tablet ?Commonly known as: SINGULAIR ?Take 1 tablet (10 mg total) by mouth every evening.  ?nitroGLYCERIN 0.4 MG SL tablet ?Commonly known as: NITROSTAT  ?NovoLOG FlexPen 100 UNIT/ML FlexPen ?Generic drug: insulin aspart ?Inject 8 Units into the skin 3 (three) times daily with meals.  ?oxyCODONE-acetaminophen 5-325 MG tablet ?Commonly known as: PERCOCET/ROXICET ?Take 2 tablets by mouth every 6 (six) hours as needed for severe pain.  ?Pentips 32G X 4 MM Misc ?Generic drug: Insulin Pen Needle ?Use as directed  ?QUEtiapine 200 MG tablet ?Commonly known as: SEROquel ?Take 1 tablet (200 mg total) by mouth at bedtime.  ?topiramate 25 MG tablet ?Commonly known as: TOPAMAX ?Take 1 tablet (25 mg total) by mouth at bedtime.  ?TRELEGY ELLIPTA IN  ?VITAMIN D3 GUMMIES PO  ? ? ?Medication changes verified by the patient? Yes ?  ? ?Medication Accessibility: ? ?Home Pharmacy: [facility] [phone] ? ?Was the patient provided with  refills on discharged medications? Yes ? ?Have all prescriptions been transferred from [facility] to home pharmacy? Yes  ? ?Medication Review: ?APIXABAN (ELIQUIS)  ?Apixaban 5 mg BID (restarted home med)  ?- Discussed importance of taking medication around the same time everyday  ?- Reviewed potential DDIs with patient  ?- Advised patient of medications to avoid (NSAIDs, ASA)  ?- Educated that Tylenol (acetaminophen) will be the preferred analgesic to prevent risk of bleeding   ?- Emphasized importance of monitoring for signs and symptoms of bleeding (abnormal bruising, prolonged bleeding, nose bleeds, bleeding from gums, discolored urine, black tarry stools)  ?- Advised patient to alert all providers of anticoagulation therapy prior to starting a new medication or having a procedure  ? ?Follow-up Appointments: ? ? Date Visit Type Length Department  ? 11/14/2021  3:00 PM MR BRAIN W WO CONTRAST 60 min [facility] [[id number]]  ?Patient Instructions:   ?Please arrive 15 minutes prior to your appointment time.  ?      ? 11/16/2021 11:30 AM LAB MO 15 min [facility] [[id number]]  ?Patient Instructions:   ?  ?      ? 11/16/2021 12:00 PM EST PT 30 30 min [facility] [[id number]]  ?Patient Instructions:   ?  ?      ? 11/16/2021 12:30 PM PALLIATIVE CARE    30 min [facility] [[id number]]  ?Patient Instructions:   ?  ?      ? 11/16/2021  1:00 PM CT SIMULATION 60 min [facility] [[id number]]  ? 11/16/2021  1:00 PM CT SIMULATION 60 min [facility] [[id number]]  ?Patient Instructions:   ? ? ?If their condition worsens, is the pt aware to call PCP or go to the Emergency Dept.? Yes  ? ?Final Patient Assessment: ?How have you been since you were released from the hospital? Spoke with pt's wife. States pt is not doing well after hospital visit due to his diagnosis. She endorses that he is continuing some medications but the oncologist has taken pt off of some of them. She states she is aware of ADEs of Eliquis and would like husband to continue medication management at [facility].  ? ?[place] Yulissa Needham, PharmD Candidate [phone]'  ?[facility]  ? ?

## 2021-11-14 ENCOUNTER — Ambulatory Visit (HOSPITAL_COMMUNITY)
Admission: RE | Admit: 2021-11-14 | Discharge: 2021-11-14 | Disposition: A | Discharge status: Home / Self Care | Payer: Medicare Other | Source: Ambulatory Visit | Attending: Internal Medicine | Admitting: Internal Medicine

## 2021-11-14 DIAGNOSIS — C719 Malignant neoplasm of brain, unspecified: Secondary | ICD-10-CM | POA: Insufficient documentation

## 2021-11-14 IMAGING — MR MR HEAD WO/W CM
12 series · 48 of 48 positions shown · IV contrast (gadavist)
Comparison: Postoperative head CTs [DATE], [DATE],
[DATE]. Pretreatment MRI [DATE].

CLINICAL DATA: 65-year-old male with left posterior temporal lobe
glioblastoma IDH wild type. Status post resection [DATE]. No
immediate postop MRI.

EXAM:
MRI HEAD WITHOUT AND WITH CONTRAST
TECHNIQUE: Multiplanar, multiecho pulse sequences of the brain and surrounding
structures were obtained without and with intravenous contrast.
CONTRAST:  10mL GADAVIST GADOBUTROL 1 MMOL/ML IV SOLN

[Series 5: dwi_tracew · axial · 3.0mm · 1.00mm/px · z∈[-24,+132]mm · 7 of 110 slices shown]
[im 1/110]
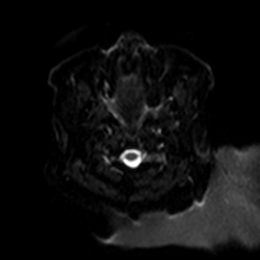
[im 19/110]
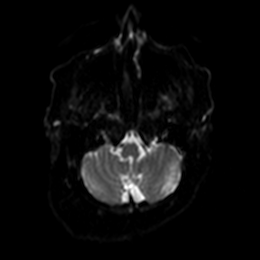
[im 37/110]
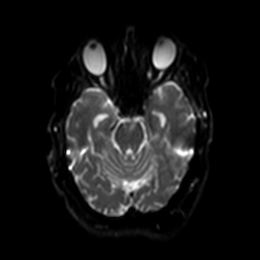
[im 55/110]
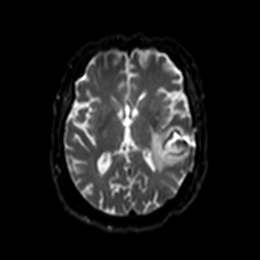
[im 73/110]
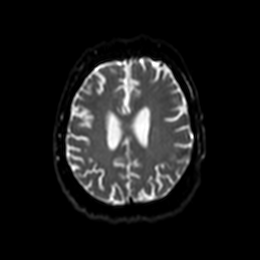
[im 91/110]
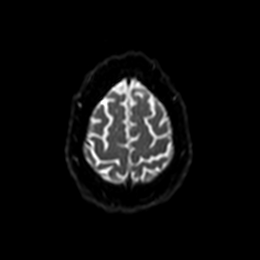
[im 110/110]
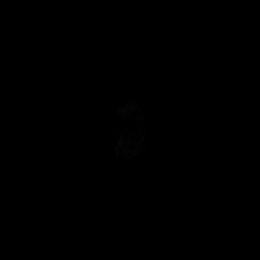

[Series 6: dwi_adc · axial · 3.0mm · 1.00mm/px · z∈[-24,+132]mm · 3 of 55 slices shown]
[im 1/55]
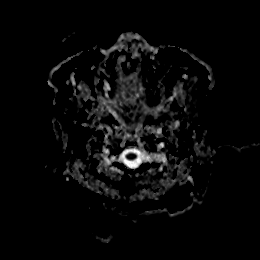
[im 28/55]
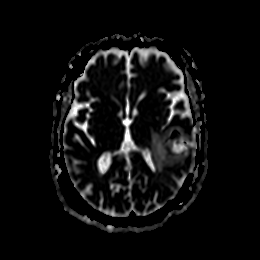
[im 55/55]
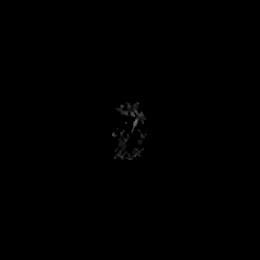

[Series 7: T2 · sagittal · 5.0mm · 0.47mm/px · 1 of 26 slices shown (1 of 2)]
[im 1/26]
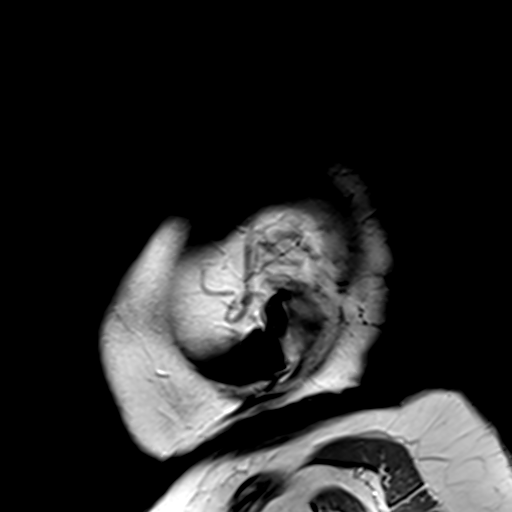

[Series 8: T2 · axial · 5.0mm · 0.62mm/px · 1 of 26 slices shown (2 of 2)]
[im 1/26]
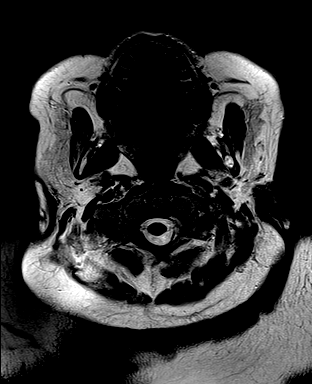

[Series 9: swi_images · axial · 3.0mm · 0.75mm/px · z∈[-32,+126]mm · 3 of 56 slices shown]
[im 1/56]
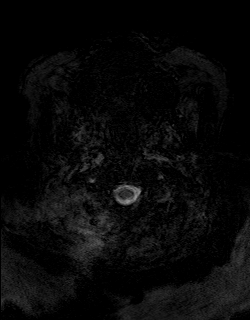
[im 28/56]
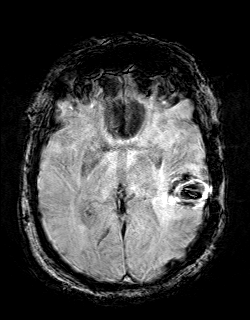
[im 56/56]
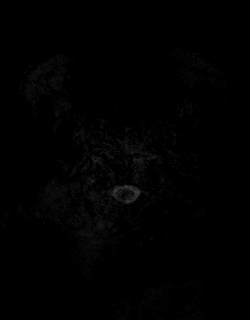

[Series 11: FLAIR · axial · 3.0mm · 0.75mm/px · z∈[-31,+125]mm · 3 of 55 slices shown]
[im 1/55]
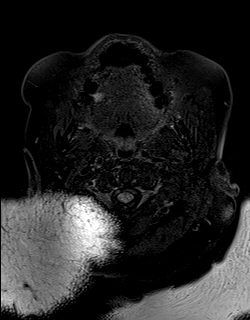
[im 28/55]
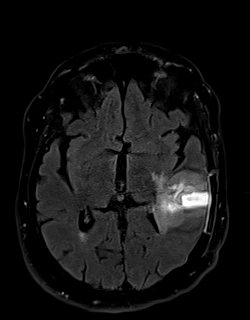
[im 55/55]
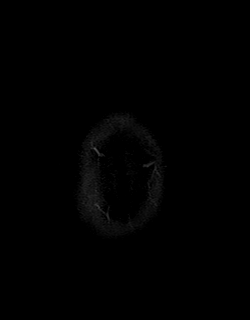

[Series 12: T1 · axial · 1.0mm · 0.94mm/px · z∈[-37,+131]mm · 10 of 176 slices shown]
[im 1/176]
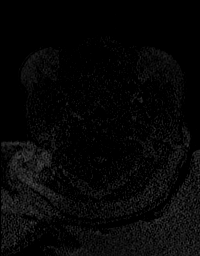
[im 20/176]
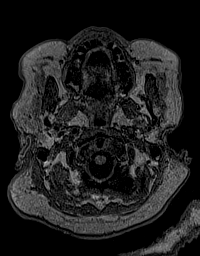
[im 39/176]
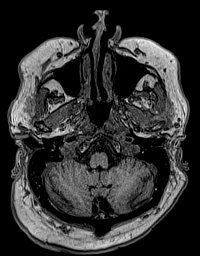
[im 59/176]
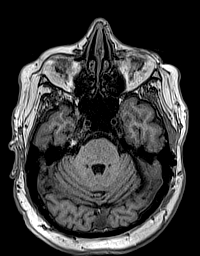
[im 78/176]
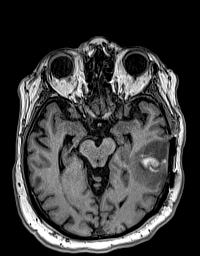
[im 98/176]
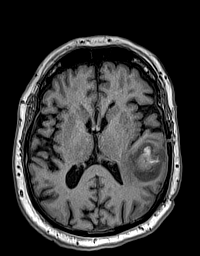
[im 117/176]
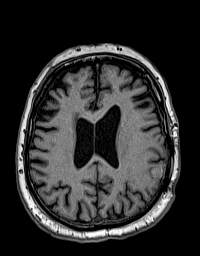
[im 137/176]
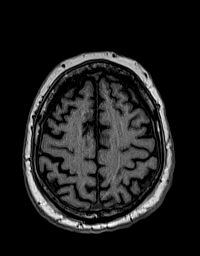
[im 156/176]
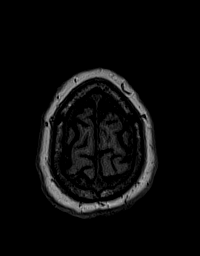
[im 176/176]
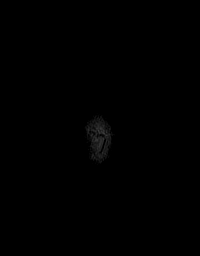

[Series 13: cor dwi_tracew · coronal · 5.0mm · 1.53mm/px · 4 of 62 slices shown]
[im 1/62]
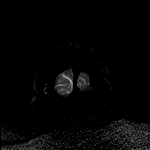
[im 21/62]
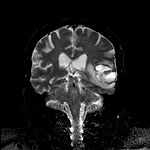
[im 41/62]
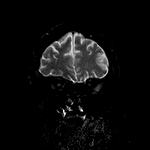
[im 62/62]
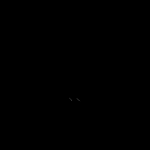

[Series 14: cor dwi_adc · coronal · 5.0mm · 1.53mm/px · 2 of 30 slices shown]
[im 1/30]
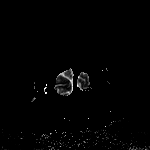
[im 30/30]
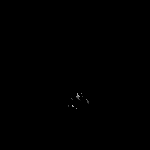

[Series 15: T2 post-contrast · coronal · 5.0mm · 0.57mm/px · 2 of 34 slices shown]
[im 1/34]
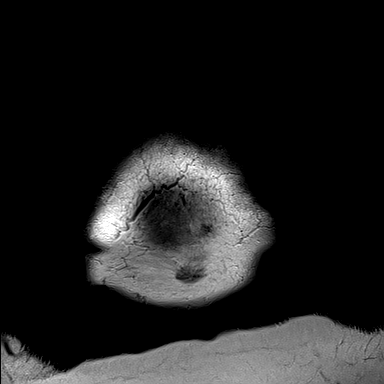
[im 34/34]
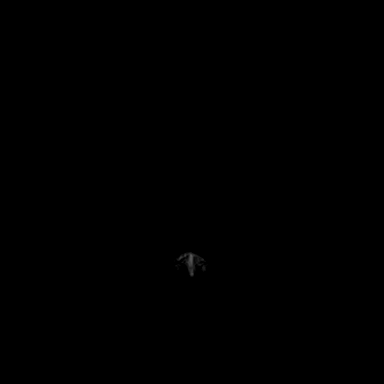

[Series 16: T1 post-contrast · axial · 1.0mm · 0.94mm/px · z∈[-37,+131]mm · 10 of 176 slices shown (1 of 2)]
[im 1/176]
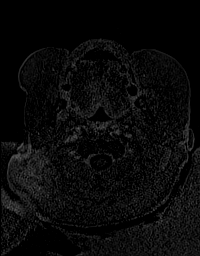
[im 20/176]
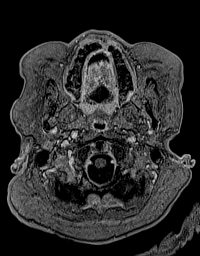
[im 39/176]
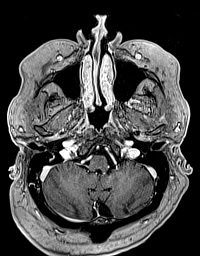
[im 59/176]
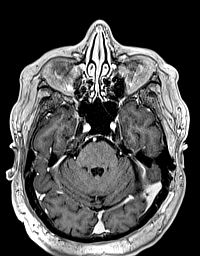
[im 78/176]
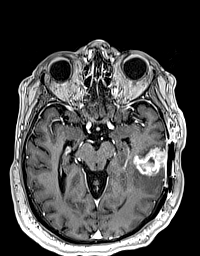
[im 98/176]
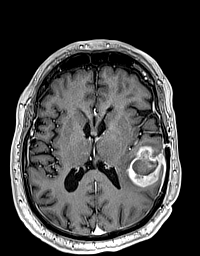
[im 117/176]
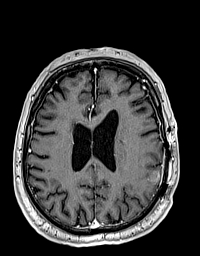
[im 137/176]
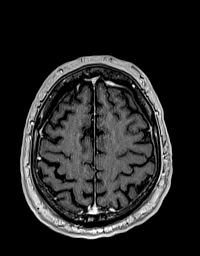
[im 156/176]
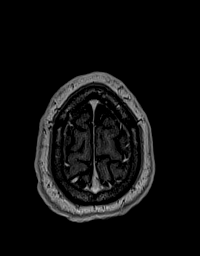
[im 176/176]
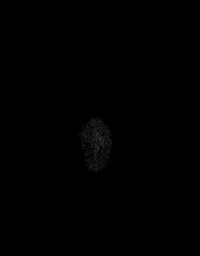

[Series 17: T1 post-contrast · coronal · 5.0mm · 0.43mm/px · 2 of 34 slices shown (2 of 2)]
[im 1/34]
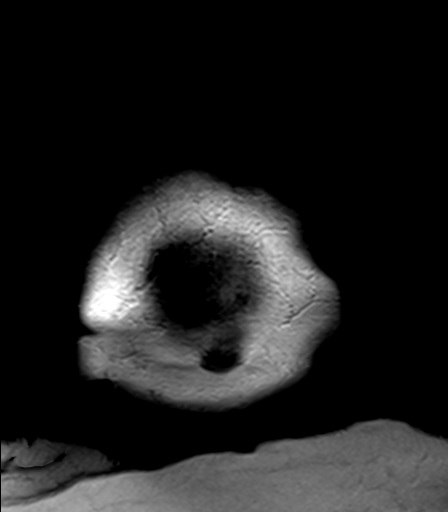
[im 34/34]
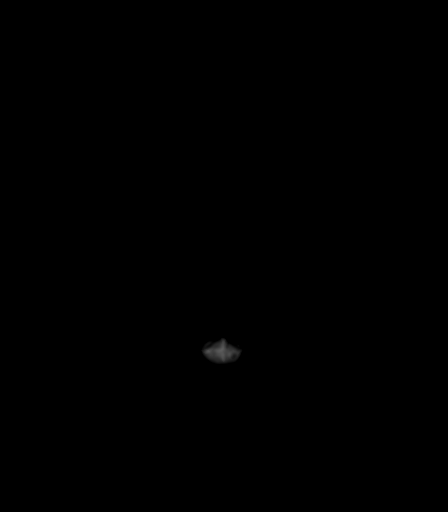

[48 of 48 positions shown; findings below may reference images not displayed]

FINDINGS: Brain: Left posterior temporal lobe resection cavity with overlying
craniotomy. Small volume gas and blood products within the resection
cavity with susceptibility artifact on DWI. Resection margin
demonstrates thick rind like peripheral enhancement up to 18 mm as
seen on series 16, image 88. Similar thick T2 and FLAIR
hyperintensity there. Other regional T2 and FLAIR signal
abnormality, including tracking into the posterior limb left
internal capsule, is stable. Mass effect on the left temporal horn
is stable.

Located about 15 mm above the resection cavity along the posterior
frontal operculum there is a round roughly 14 mm FLAIR hyperintense
lesion with conspicuous abnormal trace diffusion (series 5, image 91
and faint enhancement versus intrinsic blood products (series 16,
image 114). There is evidence of hemosiderin on SWI and this
corresponds to postoperative CT head rounded hyperdense blood with
the same size and configuration, and lies directly below the
posterior craniotomy site.

Other mild postoperative smooth dural thickening and enhancement
underlying the craniotomy. No new signal abnormality elsewhere in
the brain.

No superimposed restricted diffusion suggestive of acute infarction.
No midline shift, ventriculomegaly, or new intracranial hemorrhage.
Cervicomedullary junction and pituitary are within normal limits.

Vascular: Major intracranial vascular flow voids are stable. The
major dural venous sinuses are enhancing and appear to be patent.

Skull and upper cervical spine: Negative.

Sinuses/Orbits: Improved paranasal sinus aeration. Stable and
negative orbits.

Other: Mild right mastoid effusions are stable. Postoperative
changes to the left scalp.
IMPRESSION: 1. New postoperative baseline MRI following glioblastoma resection
on [DATE].
Thick and rind-like enhancement along the resection cavity is
indeterminate for treatment affect (enhancing infarct along the
surgical margin) versus viable Tumor. Regional T2/FLAIR
hyperintensity is stable from preop MRI.

2. A second small 14 mm FLAIR and DWI hyperintense lesion located
about 1.5 cm away from the resection along the posterior left
frontal operculum is favored to be a small unresolved hemorrhagic
contusion under the posterior craniotomy.

3. Study reviewed with Dr. RAHEL on [DATE] at [86]
hours.

## 2021-11-14 MED ORDER — GADOBUTROL 1 MMOL/ML IV SOLN
10.0000 mL | Freq: Once | INTRAVENOUS | Status: AC | PRN
  Administered 2021-11-14: 10 mL via INTRAVENOUS

## 2021-11-16 ENCOUNTER — Other Ambulatory Visit: Payer: Self-pay

## 2021-11-16 ENCOUNTER — Inpatient Hospital Stay: Payer: Medicare Other

## 2021-11-16 ENCOUNTER — Inpatient Hospital Stay (HOSPITAL_BASED_OUTPATIENT_CLINIC_OR_DEPARTMENT_OTHER): Payer: Medicare Other | Admitting: Internal Medicine

## 2021-11-16 ENCOUNTER — Telehealth: Payer: Self-pay

## 2021-11-16 ENCOUNTER — Inpatient Hospital Stay (HOSPITAL_BASED_OUTPATIENT_CLINIC_OR_DEPARTMENT_OTHER): Payer: Medicare Other | Admitting: Nurse Practitioner

## 2021-11-16 ENCOUNTER — Encounter: Payer: Self-pay | Admitting: Nurse Practitioner

## 2021-11-16 ENCOUNTER — Encounter: Payer: Self-pay | Admitting: Internal Medicine

## 2021-11-16 ENCOUNTER — Ambulatory Visit
Admission: RE | Admit: 2021-11-16 | Discharge: 2021-11-16 | Disposition: A | Discharge status: Home / Self Care | Payer: Medicare Other | Source: Ambulatory Visit | Attending: Radiation Oncology | Admitting: Radiation Oncology

## 2021-11-16 ENCOUNTER — Other Ambulatory Visit (HOSPITAL_COMMUNITY): Payer: Self-pay

## 2021-11-16 ENCOUNTER — Telehealth: Payer: Self-pay | Admitting: Pharmacist

## 2021-11-16 VITALS — BP 110/50 | HR 71 | Temp 97.8°F | Resp 16 | Wt 261.0 lb

## 2021-11-16 DIAGNOSIS — C712 Malignant neoplasm of temporal lobe: Secondary | ICD-10-CM

## 2021-11-16 DIAGNOSIS — Z515 Encounter for palliative care: Secondary | ICD-10-CM

## 2021-11-16 DIAGNOSIS — C719 Malignant neoplasm of brain, unspecified: Secondary | ICD-10-CM

## 2021-11-16 DIAGNOSIS — R531 Weakness: Secondary | ICD-10-CM

## 2021-11-16 DIAGNOSIS — R569 Unspecified convulsions: Secondary | ICD-10-CM

## 2021-11-16 DIAGNOSIS — R4701 Aphasia: Secondary | ICD-10-CM | POA: Diagnosis not present

## 2021-11-16 DIAGNOSIS — Z7189 Other specified counseling: Secondary | ICD-10-CM

## 2021-11-16 DIAGNOSIS — Z510 Encounter for antineoplastic radiation therapy: Secondary | ICD-10-CM | POA: Insufficient documentation

## 2021-11-16 DIAGNOSIS — R7401 Elevation of levels of liver transaminase levels: Secondary | ICD-10-CM | POA: Insufficient documentation

## 2021-11-16 DIAGNOSIS — Z79899 Other long term (current) drug therapy: Secondary | ICD-10-CM | POA: Insufficient documentation

## 2021-11-16 LAB — VALPROIC ACID LEVEL: Valproic Acid Lvl: 82 ug/mL (ref 50.0–100.0)

## 2021-11-16 MED ORDER — ONDANSETRON HCL 8 MG PO TABS
8.0000 mg | ORAL_TABLET | Freq: Two times a day (BID) | ORAL | 1 refills | Status: AC | PRN
  Filled 2021-11-16: qty 30, 15d supply, fill #0

## 2021-11-16 MED ORDER — TEMOZOLOMIDE 180 MG PO CAPS
75.0000 mg/m2/d | ORAL_CAPSULE | Freq: Every day | ORAL | 0 refills | Status: AC
  Filled 2021-11-16: qty 45, 45d supply, fill #0
  Filled 2021-11-17: qty 28, 28d supply, fill #0

## 2021-11-16 NOTE — Progress Notes (Signed)
? ?Jeff Wood  ?[place], [place] [address] ?[phone]) [phone] ? ? ?Interval Evaluation ? ?Date of Service: 11/16/21 ?Patient Name: Jeff Rawl Sr. ?Patient MRN: [id number] ?Patient DOB: [birth year] ?Provider: Ventura Sellers, MD ? ?Identifying Statement:  ?Jeff Sheffler Sr. is a 66 y.o. male with left temporal glioblastoma  ? ?Oncologic History: ?Oncology History  ?Glioblastoma, IDH-wildtype ([facility])  ?10/20/2021 Surgery  ? Craniotomy, resection of L temporal mass by Dr. Annette Stable.  Path demonstrates Glioblastoma IDHwt. ?  ? ? ?Biomarkers: ? ?MGMT Unknown.  ?IDH 1/2 Wild type.  ?EGFR Unknown  ?TERT Unknown  ? ?Interval History: ?Jeff Elza Sr. And familyi describe improvement in mood symptoms and energy since medication changes were made last week.  He does feel "a little out of it and not great" however, since the decadron was take off this weekend.  He is walking well, had a PT assessment this week.  Appetite remains strong.  Less dysfunctional interactions with family, fewer mood swings and irritability.  No issues with the depakote.  ? ?H+P (11/09/21) Presented to medical attention in mid-February with sudden onset inability to speak or communicate.  Patient had been in normal state of health just prior.  In ED, workup revealed enhancing mass in the left temporal lobe consistent with primary brain tumor.  He underwent craniotomy, resection with Dr. Annette Stable on 10/20/21; path demonstrated glioblastoma.  Following surgery, there were deficits with language, right sided weakness, prompting rehab admission.  Since discharge from rehab, there have been issues with irritability, anger, mood swings, poor sleep.  He is dosing decadron $RemoveBeforeD'2mg'MsniXxfsDFKthU$  twice per day, Keppra $RemoveBef'1000mg'GTkrqxMzNG$  twice per day.  Walking mostly on his own, though does use a walker at times.  He is frustrated about not driving or shooting his guns.  Currently living with his wife and daughter who just moved down to  [place].  ? ?Medications: ?Current Outpatient Medications on File Prior to Visit  ?Medication Sig Dispense Refill  ? acetaminophen (TYLENOL) 325 MG tablet Take 2 tablets (650 mg total) by mouth every 6 (six) hours as needed for mild pain (or Fever >/= 101).    ? albuterol (VENTOLIN HFA) 108 (90 Base) MCG/ACT inhaler Inhale 1-2 puffs into the lungs every 6 (six) hours as needed for wheezing or shortness of breath. 8.5 g 0  ? apixaban (ELIQUIS) 5 MG TABS tablet Take 1 tablet (5 mg total) by mouth 2 (two) times daily. 60 tablet 0  ? atorvastatin (LIPITOR) 40 MG tablet Take 1 tablet (40 mg total) by mouth daily. 30 tablet 0  ? Cholecalciferol (VITAMIN D3 GUMMIES PO) Take 2 tablets by mouth every evening.    ? dexamethasone (DECADRON) 2 MG tablet Take 1 tablet (2 mg total) by mouth daily. 5 tablet 0  ? diazepam (VALIUM) 5 MG tablet Take 1 tablet by mouth 45 minutes prior to MRI scans. 2 tablet 0  ? diclofenac Sodium (VOLTAREN) 1 % GEL Apply 2 g topically 4 (four) times daily. 200 g 0  ? divalproex (DEPAKOTE) 500 MG DR tablet Take 2 tablets (1,000 mg total) by mouth 2 (two) times daily. 120 tablet 3  ? famotidine (PEPCID) 20 MG tablet Take 1 tablet (20 mg total) by mouth daily. 30 tablet 0  ? fesoterodine (TOVIAZ) 4 MG TB24 tablet Take 1 tablet (4 mg total) by mouth daily. 30 tablet 0  ? fluconazole (DIFLUCAN) 100 MG tablet Take 2 tablets today, then 1 tablet daily x  13 more days. Hold Atorvastatin while on this drug. 15 tablet 0  ? fluticasone (FLONASE) 50 MCG/ACT nasal spray Place 2 sprays into both nostrils in the morning and at bedtime.    ? Fluticasone-Umeclidin-Vilant (TRELEGY ELLIPTA IN) Inhale 1 puff into the lungs daily.    ? gemfibrozil (LOPID) 600 MG tablet Take 1 tablet (600 mg total) by mouth 2 (two) times daily before a meal. 60 tablet 0  ? insulin aspart (NOVOLOG FLEXPEN) 100 UNIT/ML FlexPen Inject 8 Units into the skin 3 (three) times daily with meals. (Patient not taking: Reported on 11/09/2021) 15 mL  11  ? insulin glargine (LANTUS) 100 UNIT/ML Solostar Pen Inject 30 Units into the skin 2 (two) times daily. 15 mL 11  ? Insulin Pen Needle (PEN NEEDLES) 32G X 4 MM MISC Use as directed 100 each 0  ? isosorbide mononitrate (IMDUR) 30 MG 24 hr tablet Take 1 tablet (30 mg total) by mouth daily. 30 tablet 0  ? metoprolol tartrate (LOPRESSOR) 100 MG tablet Take 1 tablet (100 mg total) by mouth 2 (two) times daily. 60 tablet 0  ? montelukast (SINGULAIR) 10 MG tablet Take 1 tablet (10 mg total) by mouth every evening. 30 tablet 0  ? Multiple Vitamins-Minerals (MENS MULTIVITAMIN PO) Take 1 tablet by mouth daily.    ? nitroGLYCERIN (NITROSTAT) 0.4 MG SL tablet Place 0.4 mg under the tongue every 5 (five) minutes as needed for chest pain.    ? oxyCODONE-acetaminophen (PERCOCET/ROXICET) 5-325 MG tablet Take 2 tablets by mouth every 6 (six) hours as needed for severe pain. (Patient not taking: Reported on 11/10/2021) 30 tablet 0  ? QUEtiapine (SEROQUEL) 200 MG tablet Take 1 tablet (200 mg total) by mouth at bedtime. 30 tablet 0  ? topiramate (TOPAMAX) 25 MG tablet Take 1 tablet (25 mg total) by mouth at bedtime. (Patient not taking: Reported on 11/10/2021) 30 tablet 0  ? ?No current facility-administered medications on file prior to visit.  ? ? ?Allergies:  ?Allergies  ?Allergen Reactions  ? Iodine Anaphylaxis  ? Shellfish Allergy Anaphylaxis  ? ?Past Medical History:  ?Past Medical History:  ?Diagnosis Date  ? Morbid obesity ([facility]) 10/17/2021  ? ?Past Surgical History:  ?Past Surgical History:  ?Procedure Laterality Date  ? APPLICATION OF CRANIAL NAVIGATION Left 10/20/2021  ? Procedure: APPLICATION OF CRANIAL NAVIGATION;  Surgeon: Earnie Larsson, MD;  Location: [facility];  Service: Neurosurgery;  Laterality: Left;  ? CRANIOTOMY Left 10/20/2021  ? Procedure: Left temporal craniotomy for tumor with Brain Lab;  Surgeon: Earnie Larsson, MD;  Location: [facility];  Service: Neurosurgery;  Laterality: Left;  ? RADIOLOGY WITH ANESTHESIA N/A 10/18/2021  ?  Procedure: MRI WITH ANESTHESIA;  Surgeon: Radiologist, Medication, MD;  Location: [facility];  Service: Radiology;  Laterality: N/A;  ? ?Social History:  ?Social History  ? ?Socioeconomic History  ? Marital status: Married  ?  Spouse name: Not on file  ? Number of children: Not on file  ? Years of education: Not on file  ? Highest education level: Not on file  ?Occupational History  ? Not on file  ?Tobacco Use  ? Smoking status: Never  ? Smokeless tobacco: Never  ?Vaping Use  ? Vaping Use: Never used  ?Substance and Sexual Activity  ? Alcohol use: Not Currently  ? Drug use: Never  ? Sexual activity: Not Currently  ?Other Topics Concern  ? Not on file  ?Social History Narrative  ? Not on file  ? ?Social Determinants of Health  ? ?  Financial Resource Strain: Not on file  ?Food Insecurity: Not on file  ?Transportation Needs: Not on file  ?Physical Activity: Not on file  ?Stress: Not on file  ?Social Connections: Not on file  ?Intimate Partner Violence: Not on file  ? ?Family History: No family history on file. ? ?Review of Systems: ?Limited by dyphasia ? ?Physical Exam: ?Vitals:  ? 11/16/21 1205  ?BP: (!) 110/50  ?Pulse: 71  ?Resp: 16  ?Temp: 97.8 ?F (36.6 ?C)  ?SpO2: 98%  ? ?KPS: 70. ?General: Obese habitus ?Head: Normal ?EENT: No conjunctival injection or scleral icterus.  ?Lungs: Resp effort normal ?Cardiac: Regular rate ?Abdomen: Non-distended abdomen ?Skin: No rashes cyanosis or petechiae. ?Extremities: No clubbing or edema ? ?Neurologic Exam: ?Mental Status: Awake, alert, attentive to examiner. Oriented to self and environment. Language is impaired with regards to comprehension, word salad noted.  Fluency is mostly preserved.  Poor insight into nature of condition. ?Cranial Nerves: Visual acuity is grossly normal. Visual fields are full. Extra-ocular movements intact. No ptosis. Face is symmetric ?Motor: Tone and bulk are normal. Power is 4+/5 in right arm and leg. Reflexes are symmetric, no pathologic reflexes  present.  ?Sensory: Intact to light touch ?Gait: Walker assisted ? ? ?Labs: ?I have reviewed the data as listed ?   ?Component Value Date/Time  ? NA 134 (L) 11/04/2021 0859  ? K 3.7 11/04/2021 0859  ? CL 102 03/08/202

## 2021-11-16 NOTE — Telephone Encounter (Signed)
Oral Oncology Pharmacist Encounter ? ?Received new prescription for Temodar (temozolomide) for the treatment of glioblastoma in conjunction with radiation, planned duration 42 days. ? ?BMP and CBC from 11/02/21 assessed, noted pt pltc of 143 K/uL. Prescription dose and frequency assessed for appropriateness. Appropriate for therapy initiation.  ? ?Current medication list in Epic reviewed, DDIs with Temodar identified: ?Category C drug-drug interaction between Temodar and Depakote - valproate products can increase risk of ADEs from temozolomide (valproate products may increase serum concentrations of Temodar). Recommend close monitoring of labs while on concomitant agents - no change in therapy warranted at this time.  ? ?Evaluated chart and no patient barriers to medication adherence noted.  ? ?Patient agreement for treatment documented in MD note on 11/16/21. ? ?Prescription has been e-scribed to the [facility] for benefits analysis and approval. ? ?Oral Oncology Clinic will continue to follow for insurance authorization, copayment issues, initial counseling and start date. ? ?Leron Croak, PharmD, BCPS ?Hematology/Oncology Clinical Pharmacist ?[facility] Oral Chemotherapy [facility] ?[phone] ?11/16/2021 1:05 PM ? ?

## 2021-11-16 NOTE — Telephone Encounter (Signed)
Oral Oncology Patient Advocate Encounter ? ?After completing a benefits investigation, prior authorization for Temmodar is not required at this time through Medicare B. ? ?Patient's copay is $47.34.   ? ? ?Wynn Maudlin CPHT ?Specialty Pharmacy Patient Advocate ?[place] ?Phone [phone] ?Fax [phone] ?11/16/2021 1:15 PM ? ? ?  ? ?

## 2021-11-16 NOTE — Progress Notes (Signed)
Palliative Medicine [facility]  Telephone:(336) [phone] Fax:(336) [phone]   Name: Jeff Pereida Sr. Date: 11/16/2021 MRN: [id number]  DOB: 1956/08/26  Patient Care Team: Pcp, No as PCP - General    REASON FOR CONSULTATION: Jeff Witmer Sr. is a 66 y.o. 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 Jordan Likes. Palliative ask to see for ongoing support and goals of care.    SOCIAL HISTORY:     reports that he has never smoked. He has never used smokeless tobacco. He reports that he does not currently use alcohol. He reports that he does not use drugs.  ADVANCE DIRECTIVES:  Jeff Wood has a documented advanced directive on file. I have personally reviewed and discussed with family. His documented wishes identify Jeff Wood as his medical decision maker. Introduction to MOST form with plans to complete at next visit.   CODE STATUS: DNR  PAST MEDICAL HISTORY: Past Medical History:  Diagnosis Date   Morbid obesity (HCC) 10/17/2021    PAST SURGICAL HISTORY:  Past Surgical History:  Procedure Laterality Date   APPLICATION OF CRANIAL NAVIGATION Left 10/20/2021   Procedure: APPLICATION OF CRANIAL NAVIGATION;  Surgeon: Julio Sicks, MD;  Location: [facility] OR;  Service: Neurosurgery;  Laterality: Left;   CRANIOTOMY Left 10/20/2021   Procedure: Left temporal craniotomy for tumor with Brain Lab;  Surgeon: Julio Sicks, MD;  Location: [facility] OR;  Service: Neurosurgery;  Laterality: Left;   RADIOLOGY WITH ANESTHESIA N/A 10/18/2021   Procedure: MRI WITH ANESTHESIA;  Surgeon: Radiologist, Medication, MD;  Location: [facility] OR;  Service: Radiology;  Laterality: N/A;    HEMATOLOGY/ONCOLOGY HISTORY:  Oncology History  Glioblastoma, IDH-wildtype (HCC)  10/20/2021 Surgery   Craniotomy, resection of L temporal mass by Dr. Jordan Likes.  Path demonstrates Glioblastoma IDHwt.   11/23/2021 -  Chemotherapy   Patient is on Treatment Plan :  BRAIN GLIOBLASTOMA Radiation Therapy With Concurrent Temozolomide 75 mg/m2 Daily Followed By Sequential Maintenance Temozolomide x 6-12 cycles       ALLERGIES:  is allergic to iodine and shellfish allergy.  MEDICATIONS:  Current Outpatient Medications  Medication Sig Dispense Refill   acetaminophen (TYLENOL) 325 MG tablet Take 2 tablets (650 mg total) by mouth every 6 (six) hours as needed for mild pain (or Fever >/= 101).     albuterol (VENTOLIN HFA) 108 (90 Base) MCG/ACT inhaler Inhale 1-2 puffs into the lungs every 6 (six) hours as needed for wheezing or shortness of breath. 8.5 g 0   apixaban (ELIQUIS) 5 MG TABS tablet Take 1 tablet (5 mg total) by mouth 2 (two) times daily. 60 tablet 0   atorvastatin (LIPITOR) 40 MG tablet Take 1 tablet (40 mg total) by mouth daily. 30 tablet 0   Cholecalciferol (VITAMIN D3 GUMMIES PO) Take 2 tablets by mouth every evening.     dexamethasone (DECADRON) 2 MG tablet Take 1 tablet (2 mg total) by mouth daily. 5 tablet 0   diazepam (VALIUM) 5 MG tablet Take 1 tablet by mouth 45 minutes prior to MRI scans. 2 tablet 0   diclofenac Sodium (VOLTAREN) 1 % GEL Apply 2 g topically 4 (four) times daily. 200 g 0   divalproex (DEPAKOTE) 500 MG DR tablet Take 2 tablets (1,000 mg total) by mouth 2 (two) times daily. 120 tablet 3   famotidine (PEPCID) 20 MG tablet Take 1 tablet (20 mg total) by mouth daily. 30 tablet 0   fesoterodine (TOVIAZ) 4 MG TB24  tablet Take 1 tablet (4 mg total) by mouth daily. 30 tablet 0   fluconazole (DIFLUCAN) 100 MG tablet Take 2 tablets today, then 1 tablet daily x 13 more days. Hold Atorvastatin while on this drug. 15 tablet 0   fluticasone (FLONASE) 50 MCG/ACT nasal spray Place 2 sprays into both nostrils in the morning and at bedtime.     Fluticasone-Umeclidin-Vilant (TRELEGY ELLIPTA IN) Inhale 1 puff into the lungs daily.     gemfibrozil (LOPID) 600 MG tablet Take 1 tablet (600 mg total) by mouth 2 (two) times daily before a meal. 60  tablet 0   insulin aspart (NOVOLOG FLEXPEN) 100 UNIT/ML FlexPen Inject 8 Units into the skin 3 (three) times daily with meals. (Patient not taking: Reported on 11/09/2021) 15 mL 11   insulin glargine (LANTUS) 100 UNIT/ML Solostar Pen Inject 30 Units into the skin 2 (two) times daily. 15 mL 11   Insulin Pen Needle (PEN NEEDLES) 32G X 4 MM MISC Use as directed 100 each 0   isosorbide mononitrate (IMDUR) 30 MG 24 hr tablet Take 1 tablet (30 mg total) by mouth daily. 30 tablet 0   metoprolol tartrate (LOPRESSOR) 100 MG tablet Take 1 tablet (100 mg total) by mouth 2 (two) times daily. 60 tablet 0   montelukast (SINGULAIR) 10 MG tablet Take 1 tablet (10 mg total) by mouth every evening. 30 tablet 0   Multiple Vitamins-Minerals (MENS MULTIVITAMIN PO) Take 1 tablet by mouth daily.     nitroGLYCERIN (NITROSTAT) 0.4 MG SL tablet Place 0.4 mg under the tongue every 5 (five) minutes as needed for chest pain.     ondansetron (ZOFRAN) 8 MG tablet Take 1 tablet (8 mg total) by mouth 2 (two) times daily as needed (nausea and vomiting). May take 30-60 minutes prior to Temodar administration if nausea/vomiting occurs. 30 tablet 1   oxyCODONE-acetaminophen (PERCOCET/ROXICET) 5-325 MG tablet Take 2 tablets by mouth every 6 (six) hours as needed for severe pain. (Patient not taking: Reported on 11/10/2021) 30 tablet 0   QUEtiapine (SEROQUEL) 200 MG tablet Take 1 tablet (200 mg total) by mouth at bedtime. 30 tablet 0   temozolomide (TEMODAR) 180 MG capsule Take 1 capsule (180 mg total) by mouth daily. May take on an empty stomach to decrease nausea & vomiting. 42 capsule 0   topiramate (TOPAMAX) 25 MG tablet Take 1 tablet (25 mg total) by mouth at bedtime. (Patient not taking: Reported on 11/10/2021) 30 tablet 0   No current facility-administered medications for this visit.    VITAL SIGNS: There were no vitals taken for this visit. There were no vitals filed for this visit.  Estimated body mass index is 37.45 kg/m as  calculated from the following:   Height as of 11/10/21: 5\' 10"  (1.778 m).   Weight as of an earlier encounter on 11/16/21: 261 lb (118.4 kg).  LABS: CBC:    Component Value Date/Time   WBC 9.6 11/04/2021 0859   HGB 13.4 11/04/2021 0859   HCT 39.7 11/04/2021 0859   PLT 163 11/04/2021 0859   MCV 95.0 11/04/2021 0859   NEUTROABS 11.6 (H) 10/27/2021 1718   LYMPHSABS 1.4 10/27/2021 1718   MONOABS 1.3 (H) 10/27/2021 1718   EOSABS 0.0 10/27/2021 1718   BASOSABS 0.0 10/27/2021 1718   Comprehensive Metabolic Panel:    Component Value Date/Time   NA 134 (L) 11/04/2021 0859   K 3.7 11/04/2021 0859   CL 102 11/04/2021 0859   CO2 23 11/04/2021 0859  BUN 23 11/04/2021 0859   CREATININE 0.92 11/04/2021 0859   GLUCOSE 135 (H) 11/04/2021 0859   CALCIUM 8.9 11/04/2021 0859   AST 57 (H) 10/27/2021 1718   ALT 56 (H) 10/27/2021 1718   ALKPHOS 74 10/27/2021 1718   BILITOT 0.7 10/27/2021 1718   PROT 6.6 10/27/2021 1718   ALBUMIN 3.1 (L) 10/27/2021 1718    RADIOGRAPHIC STUDIES: CT HEAD WO CONTRAST ( )  Result Date: 11/04/2021 CLINICAL DATA:  Mental status change. EXAM: CT HEAD WITHOUT CONTRAST TECHNIQUE: Contiguous axial images were obtained from the base of the skull through the vertex without intravenous contrast. RADIATION DOSE REDUCTION: This exam was performed according to the departmental dose-optimization program which includes automated exposure control, adjustment of the mA and/or kV according to patient size and/or use of iterative reconstruction technique. COMPARISON:  October 27, 2021 FINDINGS: Brain: Stable postsurgical changes from posterior left temporal lobe resection containing some gas and fluid. The previously demonstrated adjacent parenchymal hemorrhage is not well seen on today's study. Regional hypoattenuation likely represents edema is not significantly changed. 4 mm midline shift to the right. Stable mass effect. The ventricular size is stable. Vascular: No hyperdense vessel  or unexpected calcification. Skull: 6 left lateral craniotomy. Sinuses/Orbits: No acute finding. Other: Postsurgical changes in the left scalp. IMPRESSION: Stable postsurgical changes from posterior left temporal lobe resection containing some gas and fluid. The previously demonstrated adjacent parenchymal hemorrhage is not well seen on today's study. Stable mass effect and 4 mm midline shift to the right. Electronically Signed   By: Ted Mcalpine M.D.   On: 11/04/2021 18:44   CT HEAD WO CONTRAST ( )  Result Date: 10/27/2021 CLINICAL DATA:  66 year old male postoperative day 7 right posterior temporal lobe craniotomy and tumor resection. Pathology pending. EXAM: CT HEAD WITHOUT CONTRAST TECHNIQUE: Contiguous axial images were obtained from the base of the skull through the vertex without intravenous contrast. RADIATION DOSE REDUCTION: This exam was performed according to the departmental dose-optimization program which includes automated exposure control, adjustment of the mA and/or kV according to patient size and/or use of iterative reconstruction technique. COMPARISON:  Postoperative head CT 10/21/2021 and earlier. FINDINGS: Brain: Posterior left temporal lobe resection cavity containing some gas and fluid has not significantly changed in size or configuration since 10/21/2021. Small round foci of regional hemorrhage are also stable (series 3, images 19 and 22, up to 13 mm individually). Regional white matter hypodensity appears mildly regressed. Trace left side subdural pneumocephalus persists. Stable trace rightward midline shift. Mild mass effect on the atrium of the left lateral ventricle is stable. No ventriculomegaly. Basilar cisterns remain patent. No new intracranial hemorrhage or evidence of cortically based acute infarction. Vascular: Mild Calcified atherosclerosis at the skull base. Skull: Stable left lateral craniotomy. No acute osseous abnormality identified. Sinuses/Orbits: Visualized  paranasal sinuses and mastoids are stable and well aerated. Other: Evolving postoperative changes to the left scalp. Skin staples remain in place. Negative orbits soft tissues. IMPRESSION: 1. Essentially stable CT appearance of the posterior left temporal resection. Stable resection cavity with small volume adjacent parenchymal blood. Unchanged mild regional mass effect. 2. No new intracranial abnormality. Electronically Signed   By: Odessa Fleming M.D.   On: 10/27/2021 04:50   CT HEAD WO CONTRAST ( )  Result Date: 10/21/2021 CLINICAL DATA:  Brain neoplasm status post resection EXAM: CT HEAD WITHOUT CONTRAST TECHNIQUE: Contiguous axial images were obtained from the base of the skull through the vertex without intravenous contrast. RADIATION DOSE REDUCTION: This exam was performed  according to the departmental dose-optimization program which includes automated exposure control, adjustment of the mA and/or kV according to patient size and/or use of iterative reconstruction technique. COMPARISON:  Brain MRI 10/18/2021 FINDINGS: Brain: There has been interval left temporoparietal craniotomy for mass resection. The resection cavity in the temporal lobe measures approximately 3.9 cm TV x 1.7 cm AP x 2.1 cm cc. There is a small amount of blood products at the periphery of the cavity. An additional focus of intraparenchymal or subarachnoid blood is seen superior to the resection cavity measuring 1.5 cm by 1.1 cm. There is a small amount of extra-axial fluid, blood, and air overlying the resection site measuring up to approximately 3 mm in thickness in the coronal plane. A small amount of pneumocephalus is also seen overlying the left frontal lobe. There is mild edema surrounding the resection cavity and this additional focus of blood resulting in partial effacement of the left temporal horn and approximally 2 mm rightward midline shift. The remainder of the brain parenchyma is within normal limits. There is no evidence of  acute territorial infarct. Vascular: No hyperdense vessel or unexpected calcification. Skull: Postsurgical changes as above. Sinuses/Orbits: The paranasal sinuses are clear. The globes and orbits are unremarkable. Other: There is expected postoperative soft tissue swelling with fluid and gas in the left scalp. IMPRESSION: 1. Postsurgical changes reflecting interval left temporal lobe mass resection with a small amount of blood products at the periphery of the cavity as well as intraparenchymal versus subarachnoid hemorrhage superior to the resection cavity. Edema around the resection cavity and blood products results in partial effacement of the left temporal horn and trace rightward midline shift. 2. No evidence of acute territorial infarct. Electronically Signed   By: Lesia Hausen M.D.   On: 10/21/2021 08:46   MR BRAIN W WO CONTRAST  Result Date: 11/16/2021 CLINICAL DATA:  66 year old male with left posterior temporal lobe glioblastoma IDH wild type. Status post resection 10/20/2021. No immediate postop MRI. EXAM: MRI HEAD WITHOUT AND WITH CONTRAST TECHNIQUE: Multiplanar, multiecho pulse sequences of the brain and surrounding structures were obtained without and with intravenous contrast. CONTRAST:  10mL GADAVIST GADOBUTROL 1 MMOL/ML IV SOLN COMPARISON:  Postoperative head CTs 11/04/2021, 10/27/2021, 10/21/2021. Pretreatment MRI 10/18/2021. FINDINGS: Brain: Left posterior temporal lobe resection cavity with overlying craniotomy. Small volume gas and blood products within the resection cavity with susceptibility artifact on DWI. Resection margin demonstrates thick rind like peripheral enhancement up to 18 mm as seen on series 16, image 88. Similar thick T2 and FLAIR hyperintensity there. Other regional T2 and FLAIR signal abnormality, including tracking into the posterior limb left internal capsule, is stable. Mass effect on the left temporal horn is stable. Located about 15 mm above the resection cavity along  the posterior frontal operculum there is a round roughly 14 mm FLAIR hyperintense lesion with conspicuous abnormal trace diffusion (series 5, image 91 and faint enhancement versus intrinsic blood products (series 16, image 114). There is evidence of hemosiderin on SWI and this corresponds to postoperative CT head rounded hyperdense blood with the same size and configuration, and lies directly below the posterior craniotomy site. Other mild postoperative smooth dural thickening and enhancement underlying the craniotomy. No new signal abnormality elsewhere in the brain. No superimposed restricted diffusion suggestive of acute infarction. No midline shift, ventriculomegaly, or new intracranial hemorrhage. Cervicomedullary junction and pituitary are within normal limits. Vascular: Major intracranial vascular flow voids are stable. The major dural venous sinuses are enhancing and appear to  be patent. Skull and upper cervical spine: Negative. Sinuses/Orbits: Improved paranasal sinus aeration. Stable and negative orbits. Other: Mild right mastoid effusions are stable. Postoperative changes to the left scalp. IMPRESSION: 1. New postoperative baseline MRI following glioblastoma resection on 10/20/2021. Thick and rind-like enhancement along the resection cavity is indeterminate for treatment affect (enhancing infarct along the surgical margin) versus viable Tumor. Regional T2/FLAIR hyperintensity is stable from preop MRI. 2. A second small 14 mm FLAIR and DWI hyperintense lesion located about 1.5 cm away from the resection along the posterior left frontal operculum is favored to be a small unresolved hemorrhagic contusion under the posterior craniotomy. 3. Study reviewed with Dr. Earna Coder Wood on 11/16/2021 at 0722 hours. Electronically Signed   By: Odessa Fleming M.D.   On: 11/16/2021 08:19   MR BRAIN W WO CONTRAST  Result Date: 10/18/2021 CLINICAL DATA:  Initial evaluation for brain/CNS neoplasm, staging. EXAM: MRI HEAD  WITHOUT AND WITH CONTRAST TECHNIQUE: Multiplanar, multiecho pulse sequences of the brain and surrounding structures were obtained without and with intravenous contrast. CONTRAST:  10mL GADAVIST GADOBUTROL 1 MMOL/ML IV SOLN COMPARISON:  Prior CTs from 10/15/2021. FINDINGS: Brain: Cerebral volume within normal limits. Mild scattered patchy T2/FLAIR hyperintensity noted involving the periventricular and deep white matter both cerebral hemispheres, nonspecific, but most like related chronic microvascular ischemic disease, overall mild in nature in felt to be within normal limits for age. No evidence for acute or subacute infarct. No areas of chronic cortical infarction. No acute intracranial hemorrhage. Rounded mass positioned at the left temporal lobe measures 2.4 x 2.7 x 2.8 cm (AP by transverse by craniocaudad). Lesion demonstrates hyperintense T2 signal intensity with avid heterogeneous and predominantly peripheral postcontrast enhancement. Speckled areas of internal susceptibility artifact consistent with necrosis and/or blood products. Surrounding T2/FLAIR signal intensity consistent with vasogenic edema and/or infiltrating tumor. Mild mass effect on the atrium of the left lateral ventricle without significant midline shift. 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 No other mass lesion or abnormal enhancement. No hydrocephalus or ventricular trapping. No extra-axial fluid collection. Pituitary gland suprasellar region normal. Midline structures normal. Vascular: Major intracranial vascular flow voids are maintained. Skull and upper cervical spine: Craniocervical junction within normal limits. Bone marrow signal intensity within normal limits. No focal marrow replacing lesion. No scalp soft tissue abnormality. Sinuses/Orbits: Globes and orbital soft tissues demonstrate no acute finding. Scattered mucosal thickening noted within the ethmoidal air cells. Fluid seen layering within the nasopharynx.  Small bilateral mastoid effusions. Patient is intubated. Other: None. IMPRESSION: 1. 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 Surrounding vasogenic edema and/or infiltrating tumor without significant midline shift. 2. No other acute intracranial abnormality. Electronically Signed   By: Rise Mu M.D.   On: 10/18/2021 19:49   DG CHEST PORT 1 VIEW  Result Date: 10/24/2021 CLINICAL DATA:  Cough EXAM: PORTABLE CHEST 1 VIEW COMPARISON:  10/16/2021 FINDINGS: Transverse diameter of heart is increased. There is interval increase in interstitial markings in the left parahilar region and left lower lung fields. Right lung is unremarkable. There is poor inspiration. There is minimal blunting of left lateral CP angle. There is no pneumothorax. Tip of right IJ chest port is seen in the superior vena cava. IMPRESSION: Cardiomegaly. There is interval increase in interstitial markings in the left parahilar region and left lower lung fields suggesting possible interstitial pneumonia. Electronically Signed   By: Ernie Avena  M.D.   On: 10/24/2021 10:38   Overnight EEG with video  Result Date: 10/19/2021 Charlsie Quest, MD     10/19/2021  3:26 PM Patient Name: Neville Route Sr. MRN: [id number] Epilepsy Attending: Charlsie Quest Referring Physician/Provider: Dr Bing Neighbors Duration: 10/18/2021 2129 to 10/19/2021 1154 Patient history: 66 year old male with altered mental status and left temporal mass.  EEG to evaluate for seizure Level of alertness: Awake, asleep AEDs during EEG study: LEV Technical aspects: This EEG study was done with scalp electrodes positioned according to the 10-20 International system of electrode placement. Electrical activity was acquired at a sampling rate of 500Hz  and reviewed with a high frequency filter of 70Hz  and a low frequency filter of 1Hz . EEG data were recorded  continuously and digitally stored. Description: The posterior dominant rhythm consists of 9 Hz activity of moderate voltage (25-35 uV) seen predominantly in posterior head regions, symmetric and reactive to eye opening and eye closing. Sleep was characterized by vertex waves, sleep spindles (12 to 14 Hz), maximal frontocentral region.  EEG showed intermittent 3 to 5 Hz theta-delta slowing in left temporal region which at times appears rhythmic without definite evolution consistent with lateralized rhythmic delta activity.  Intermittent generalized 3 to 5 Hz theta and delta slowing was also noted. Hyperventilation and photic stimulation were not performed.   ABNORMALITY -Lateralized rhythmic delta activity, left temporal region -Intermittent slow, generalized IMPRESSION: This study is suggestive of cortical dysfunction in left temporal region which is likely secondary to underlying mass.  Of note, lateralized rhythmic activity is on the ictal-interictal continuum with low potential for seizures.  Additionally there is mild to moderate diffuse encephalopathy, nonspecific etiology.  No definite seizures or epileptiform discharges were seen throughout the recording. Priyanka O Yadav    PERFORMANCE STATUS (ECOG) : 2 - Symptomatic, <50% confined to bed  Review of Systems  Cardiovascular:  Positive for leg swelling.  Neurological:  Positive for speech difficulty and weakness.  Unless otherwise noted, a complete review of systems is negative.  Physical Exam General: NAD, sitting in wheelchair  Cardiovascular: regular rate and rhythm Pulmonary: clear ant fields Abdomen: soft, nontender, + bowel sounds Extremities: bilateral lower extremity 2+ edema, wrapped in coban Skin: left healed cranial scarring  Neurological: Weakness, alert and oriented to self and family. Able to answer some questions appropriately but with poor insight.   IMPRESSION: This is my initial visit with Jeff Wood.  His wife and daughter  are also present.  No acute distress.  He is sitting in a wheelchair eating potato chips.  Patient was seen by my colleague Corrie Dandy, NP during recent hospitalization.  I introduced myself, Research officer, trade union, and Palliative's role in collaboration with the oncology team. Concept of Palliative Care was introduced as specialized medical care for people and their families living with serious illness.  It focuses on providing relief from the symptoms and stress of a serious illness.  The goal is to improve quality of life for both the patient and the family. Values and goals of care important to patient and family were attempted to be elicited.   Jeff Wood lives in the home with his wife of more than 40 years, his daughter Aggie Cosier and her husband.  He has 2 dogs and 2 cats.  They most recently relocated to the [place] area from [place], [name] after their daughter who is a Education officer, environmental was reassigned to this area.  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  In the home patient requires assistance with ADLs.  Is ambulatory with a walker and standby assist.  Appetite is great.  Family shares prior to findings of brain tumor Geza was independent of all ADLs, driving, and active.  Denies pain other than occasional headaches and joint pains.  Wife reports he was previously taking oxycodone for chronic fibromyalgia pain however given recent glioma diagnosis advised to discontinue.  Family reports pain is well managed with Tylenol.  Denies nausea or vomiting.  We discussed Her current illness and what it means in the larger context of Her on-going co-morbidities. Natural disease trajectory and expectations were discussed.  Jeff Wood was able to answer some questions appropriately but majority of responses identifies poor insight.  Family verbalized realistic understanding of his condition.  They are clear  and expressed wishes to continue to treat the treatable with hopes of allowing him to do his gait as he can for as long as he can.  He will be starting 6 weeks of radiation therapy in addition to Temodar per Dr. Barbaraann Cao.  Patient has documented advanced directive which I reviewed and Vynca.  Family confirms wishes for DNR/DNI, no artificial feedings, with main focus on comfort in the setting patient's health declines.  DNR/DNI form completed and given to family as requested.  MOST form introduced.  Family would like to complete at next scheduled visit.  Patient expresses he is hungry and ready to lie down.  We will acknowledge his wishes with plans for further discussions in the future.  I discussed the importance of continued conversation with family and their medical providers regarding overall plan of care and treatment options, ensuring decisions are within the context of the patients values and GOCs.  PLAN: DNR/DNI form completed and given to patient.  Copy has been scanned into chart.   MOST form introduced.  Family would like to complete at next visit.   No symptom management needs at this time.   To establish therapeutic relationship and understanding of my role of palliative in collaboration with his medical team.   I will plan to see patient back in 2-3 weeks in collaboration to other oncology appointments.   Patient expressed understanding and was in agreement with this plan. He also understands that He can call the clinic at any time with any questions, concerns, or complaints.   Time Total: 50 min.   Visit consisted of counseling and education dealing with the complex and emotionally intense issues of symptom management and palliative care in the setting of serious and potentially life-threatening illness.Greater than 50%  of this time was spent counseling and coordinating care related to the above assessment and plan.  Signed by: Willette Alma, AGPCNP-BC Palliative  Medicine Team/[facility]

## 2021-11-16 NOTE — Progress Notes (Signed)
START ON PATHWAY REGIMEN - Neuro ? ? ?  One cycle, concurrent with RT: ?    Temozolomide  ? ?**Always confirm dose/schedule in your pharmacy ordering system** ? ?Patient Characteristics: ?Glioma, Glioblastoma, IDH-wildtype, Newly Diagnosed / Treatment Naive, Good Performance Status and/or Younger Patient, MGMT Promoter Unmethylated/Unknown ?Disease Classification: Glioma ?Disease Classification: Glioblastoma, IDH-wildtype ?Disease Status: Newly Diagnosed / Treatment Naive ?Performance Status: Good Performance Status and/or Younger Patient ?MGMT Promoter Methylation Status: Awaiting Test Results ?Intent of Therapy: ?Non-Curative / Palliative Intent, Discussed with Patient ?

## 2021-11-17 ENCOUNTER — Other Ambulatory Visit (HOSPITAL_COMMUNITY): Payer: Self-pay

## 2021-11-17 ENCOUNTER — Ambulatory Visit: Payer: Medicare Other | Admitting: Radiation Oncology

## 2021-11-17 DIAGNOSIS — Z510 Encounter for antineoplastic radiation therapy: Secondary | ICD-10-CM | POA: Diagnosis not present

## 2021-11-17 NOTE — Telephone Encounter (Signed)
Oral Chemotherapy Pharmacist Encounter ? ?I spoke with patient's wife Jeff Wood for overview of: Temodar for the treatment of glioblastoma multiforme in conjunction with radiation, planned duration concomitant phase 42 days of therapy.  ? ?Counseled patient's wife on administration, dosing, side effects, monitoring, drug-food interactions, safe handling, storage, and disposal. ? ?Patient will take Temodar '180mg'$  capsules, 1 capsule, 180 mg total daily dose, by mouth once daily, may take at bedtime and on an empty stomach to decrease nausea and vomiting. ? ?Patient will take Temodar concurrent with radiation for 42 days straight. ? ?Temodar start date: 11/22/21 PM ?Radiation start date: 11/23/21 ?  ?Patient will take Zofran '8mg'$  tablet, 1 tablet by mouth 30-60 min prior to Temodar dose to help decrease N/V once starting adjuvant therapy. Prophylactic Zofran will not be used at initiation of concurrent phase, but will be initiated if nausea develops despite Temodar administration on an empty stomach and at bedtime. ?  ?Adverse effects include but are not limited to: nausea, vomiting, anorexia, GI upset, rash, and fatigue. Rare but serious adverse effects of pneumocystis pneumonia and secondary malignancy also discussed. ? ?PCP prophylaxis will not be initiated at this time, but may be added based on lymphocyte count in the future. ? ?Reviewed importance of keeping a medication schedule and plan for any missed doses. No barriers to medication adherence identified. ? ?Medication reconciliation performed and medication/allergy list updated. ? ?Insurance authorization for Temodar has been obtained. Patient's wife will pick this up from the [facility] outpatient pharmacy on 11/20/21. ? ?Patient's wife informed the pharmacy will reach out 5-7 days prior to needing next fill of Temodar to coordinate continued medication acquisition to prevent break in therapy.  ? ?All questions answered. ? ?Jeff Wood voiced understanding  and appreciation.  ? ?Medication education handout and medication calendar placed in mail for patient and patient's family. Patient's family knows to call the office with questions or concerns. Oral Chemotherapy Clinic phone number provided.  ? ?Leron Croak, PharmD, BCPS ?Hematology/Oncology Clinical Pharmacist ?[facility] Oral Chemotherapy Navigation Clinics ?[phone] ?11/17/2021 11:57 AM ? ?

## 2021-11-18 ENCOUNTER — Other Ambulatory Visit (HOSPITAL_COMMUNITY): Payer: Self-pay

## 2021-11-18 ENCOUNTER — Telehealth: Payer: Self-pay | Admitting: *Deleted

## 2021-11-18 ENCOUNTER — Encounter: Payer: Medicare Other | Admitting: Registered Nurse

## 2021-11-18 ENCOUNTER — Telehealth: Payer: Self-pay | Admitting: Internal Medicine

## 2021-11-18 NOTE — Telephone Encounter (Signed)
Received vm message from [place], PT with [facility]. ?She states that pt was evaluated last week and the following disciplines will be started with this gentleman. PT 1xweek x 8 visits, OT, ST, SW, HHA 1 x a week, ?She also stated that pt's Lobid and Lipitor had serious interactions with each other ?Pt stated Dr. Mickeal Skinner was taking care of all his medications and she wanted him to know ?Dr. Mickeal Skinner made aware of the above. ?

## 2021-11-18 NOTE — Telephone Encounter (Signed)
.  Called patient to schedule appointment per 3/20 inbasket, patient wife is aware of date and time.   ?

## 2021-11-19 ENCOUNTER — Other Ambulatory Visit: Payer: Self-pay | Admitting: Internal Medicine

## 2021-11-19 ENCOUNTER — Telehealth: Payer: Self-pay | Admitting: *Deleted

## 2021-11-19 ENCOUNTER — Other Ambulatory Visit: Payer: Self-pay | Admitting: *Deleted

## 2021-11-19 MED ORDER — FUROSEMIDE 40 MG PO TABS: 40.0000 mg | ORAL_TABLET | Freq: Every day | ORAL | 1 refills | Status: DC

## 2021-11-19 NOTE — Telephone Encounter (Signed)
Received call from [facility] RN case manager with [facility] [phone].  She went out to the patients home to assess his lower extremity edema to his feet.  ? ?He had unna boots on but the patient has extreme weaping of the extremities through the unna boots.  The patient will not keep feet elevated which could help tremendously.   ? ?She stated she removed the unna boots and found 4 pitting edema with blisters that had ruptured.  She is changing his dressing with 3 layer compression pads with abdominal pads with a silver hydrofiber.   She hopes this will help dry the skin and help with compressing at the same time.   ? ?The wife stated that he was on a fluid pill but when he was discharged from the hospital that was discontinued.  He has toresemide 20 mg and lasix 40 mg on hand.   ? ?Wanting to know if they can do a trial run of the fluid pill to help bring the swelling down.   ? ?Routing to provider. ?

## 2021-11-20 ENCOUNTER — Other Ambulatory Visit (HOSPITAL_COMMUNITY): Payer: Self-pay

## 2021-11-20 ENCOUNTER — Telehealth: Payer: Self-pay

## 2021-11-20 NOTE — Telephone Encounter (Signed)
Pt's wife called to say that patient walked into the kitchen, squatted down and had a bowl movement. He then proceeded to take a sponge off of the counter to clean up the stool. Patient's wife was audibly upset requesting that patient not continue taking his Lasix. We discussed the fact that the Lasix most likely is not causing this new behavior. Wife reports that patient's urine is clear with no signs of UTI present. Advised that she could take pt to ED for evaluation but she declined. Wife is concerned that patient  is declining.  ? ?Routed to providers. ?

## 2021-11-23 ENCOUNTER — Telehealth: Payer: Self-pay | Admitting: *Deleted

## 2021-11-23 ENCOUNTER — Other Ambulatory Visit: Payer: Self-pay | Admitting: *Deleted

## 2021-11-23 ENCOUNTER — Other Ambulatory Visit: Payer: Self-pay | Admitting: Internal Medicine

## 2021-11-23 ENCOUNTER — Ambulatory Visit
Admission: RE | Admit: 2021-11-23 | Discharge: 2021-11-23 | Disposition: A | Discharge status: Home / Self Care | Payer: Medicare Other | Source: Ambulatory Visit | Attending: Radiation Oncology | Admitting: Radiation Oncology

## 2021-11-23 DIAGNOSIS — F05 Delirium due to known physiological condition: Secondary | ICD-10-CM

## 2021-11-23 DIAGNOSIS — C719 Malignant neoplasm of brain, unspecified: Secondary | ICD-10-CM

## 2021-11-23 DIAGNOSIS — R4702 Dysphasia: Secondary | ICD-10-CM

## 2021-11-23 DIAGNOSIS — Z510 Encounter for antineoplastic radiation therapy: Secondary | ICD-10-CM | POA: Diagnosis not present

## 2021-11-23 MED ORDER — RADIAPLEXRX EX GEL: Freq: Once | CUTANEOUS | Status: AC

## 2021-11-23 NOTE — Progress Notes (Addendum)
Pt here for patient teaching.  Education material reviewed with patient's daughter and spouse given patient's current cognitive state ? ?Pt and family given Radiation and You booklet, skin care instructions, and Radiaplex gel.   ? ?Reviewed areas of pertinence such as fatigue, hair loss, nausea and vomiting, skin changes, headache, and blurry vision .  ? ?Pt's family able to give teach back of to pat skin, use unscented/gentle soap, and drink plenty of water,apply Radiaplex bid, avoid applying anything to skin within 4 hours of treatment, and to use an electric razor if they must shave.  ? ?Pt showed no evidence of learning, but family demonstrated understanding information given and will contact nursing with any questions or concerns.   ? ?Http://[email or web address]/whattoexpect/index ?  ? ? ? ? ? ? ?  ? ? ? ? ? ? ? ? ? ?

## 2021-11-23 NOTE — Telephone Encounter (Signed)
Received call from [facility] with [facility] home health [phone].  She advised that the home visit this morning was extremely challenging.   ? ?Patient's wife reported that patient had devacated on the kitchen floor and smeared it all over the counter tops (previously documented by [employer] on 11/20/2021).   ? ?Reports patient is very agitated and confused and out of it.  Walked to recliner this morning around 5:00 am but refuses to ambulate.  He is complaining of pain in his lower legs.   Wife states she was told to not give him the oxycodone anymore so she reports giving him Aspirin only.  While there they did attempt to give him the oxycodone but spit it out.   ? ?Due to confusion/behavior changes and refusal to walk and/or inability to walk unsure if patient will make it to radiation.    PT was reporting as it needs further evaluation. ? ?Routing to provider to advise if his recommendation is to go to the hospital to rule out UTI, depakote levels and anything else that might have contributed to sudden changes.   ?

## 2021-11-24 ENCOUNTER — Ambulatory Visit
Admission: RE | Admit: 2021-11-24 | Discharge: 2021-11-24 | Disposition: A | Discharge status: Home / Self Care | Payer: Medicare Other | Source: Ambulatory Visit | Attending: Radiation Oncology | Admitting: Radiation Oncology

## 2021-11-24 ENCOUNTER — Encounter: Payer: Self-pay | Admitting: Radiation Oncology

## 2021-11-24 ENCOUNTER — Inpatient Hospital Stay (HOSPITAL_BASED_OUTPATIENT_CLINIC_OR_DEPARTMENT_OTHER): Payer: Medicare Other | Admitting: Internal Medicine

## 2021-11-24 ENCOUNTER — Other Ambulatory Visit: Payer: Self-pay

## 2021-11-24 ENCOUNTER — Inpatient Hospital Stay: Payer: Medicare Other

## 2021-11-24 ENCOUNTER — Inpatient Hospital Stay (HOSPITAL_BASED_OUTPATIENT_CLINIC_OR_DEPARTMENT_OTHER): Payer: Medicare Other | Admitting: Nurse Practitioner

## 2021-11-24 ENCOUNTER — Encounter: Payer: Self-pay | Admitting: Nurse Practitioner

## 2021-11-24 VITALS — BP 143/97 | HR 81 | Temp 98.1°F | Resp 19 | Ht 70.0 in

## 2021-11-24 DIAGNOSIS — Z7189 Other specified counseling: Secondary | ICD-10-CM | POA: Diagnosis not present

## 2021-11-24 DIAGNOSIS — R531 Weakness: Secondary | ICD-10-CM | POA: Diagnosis not present

## 2021-11-24 DIAGNOSIS — C719 Malignant neoplasm of brain, unspecified: Secondary | ICD-10-CM | POA: Diagnosis not present

## 2021-11-24 DIAGNOSIS — R569 Unspecified convulsions: Secondary | ICD-10-CM | POA: Diagnosis not present

## 2021-11-24 DIAGNOSIS — F05 Delirium due to known physiological condition: Secondary | ICD-10-CM

## 2021-11-24 DIAGNOSIS — Z515 Encounter for palliative care: Secondary | ICD-10-CM | POA: Diagnosis not present

## 2021-11-24 DIAGNOSIS — G4700 Insomnia, unspecified: Secondary | ICD-10-CM

## 2021-11-24 DIAGNOSIS — R4701 Aphasia: Secondary | ICD-10-CM

## 2021-11-24 DIAGNOSIS — Z510 Encounter for antineoplastic radiation therapy: Secondary | ICD-10-CM | POA: Diagnosis not present

## 2021-11-24 LAB — CMP (CANCER CENTER ONLY)
ALT: 27 U/L (ref 0–44)
AST: 25 U/L (ref 15–41)
Albumin: 3.2 g/dL — ABNORMAL LOW (ref 3.5–5.0)
Alkaline Phosphatase: 56 U/L (ref 38–126)
Anion gap: 13 (ref 5–15)
BUN: 16 mg/dL (ref 8–23)
CO2: 28 mmol/L (ref 22–32)
Calcium: 8.6 mg/dL — ABNORMAL LOW (ref 8.9–10.3)
Chloride: 95 mmol/L — ABNORMAL LOW (ref 98–111)
Creatinine: 0.88 mg/dL (ref 0.61–1.24)
GFR, Estimated: >60 mL/min (ref >60)
Glucose, Bld: 359 mg/dL — ABNORMAL HIGH (ref 70–99)
Potassium: 3.4 mmol/L — ABNORMAL LOW (ref 3.5–5.1)
Sodium: 136 mmol/L (ref 135–145)
Total Bilirubin: 0.5 mg/dL (ref 0.3–1.2)
Total Protein: 6 g/dL — ABNORMAL LOW (ref 6.5–8.1)

## 2021-11-24 LAB — CBC WITH DIFFERENTIAL (CANCER CENTER ONLY)
Abs Immature Granulocytes: 0.18 10*3/uL — ABNORMAL HIGH (ref 0.00–0.07)
Basophils Absolute: 0 10*3/uL (ref 0.0–0.1)
Basophils Relative: 1 %
Eosinophils Absolute: 0.1 10*3/uL (ref 0.0–0.5)
Eosinophils Relative: 1 %
HCT: 37.5 % — ABNORMAL LOW (ref 39.0–52.0)
Hemoglobin: 12.7 g/dL — ABNORMAL LOW (ref 13.0–17.0)
Immature Granulocytes: 3 %
Lymphocytes Relative: 15 %
Lymphs Abs: 1 10*3/uL (ref 0.7–4.0)
MCH: 31.5 pg (ref 26.0–34.0)
MCHC: 33.9 g/dL (ref 30.0–36.0)
MCV: 93.1 fL (ref 80.0–100.0)
Monocytes Absolute: 1 10*3/uL (ref 0.1–1.0)
Monocytes Relative: 15 %
Neutro Abs: 4.1 10*3/uL (ref 1.7–7.7)
Neutrophils Relative %: 65 %
Platelet Count: 164 10*3/uL (ref 150–400)
RBC: 4.03 MIL/uL — ABNORMAL LOW (ref 4.22–5.81)
RDW: 14.2 % (ref 11.5–15.5)
WBC Count: 6.4 10*3/uL (ref 4.0–10.5)
nRBC: 0 % (ref 0.0–0.2)

## 2021-11-24 LAB — URINALYSIS, COMPLETE (UACMP) WITH MICROSCOPIC
Bacteria, UA: NONE SEEN
Bilirubin Urine: NEGATIVE
Glucose, UA: >=500 mg/dL — AB
Hgb urine dipstick: NEGATIVE
Ketones, ur: 20 mg/dL — AB
Leukocytes,Ua: NEGATIVE
Nitrite: NEGATIVE
Protein, ur: NEGATIVE mg/dL
Specific Gravity, Urine: 1.026 (ref 1.005–1.030)
pH: 6 (ref 5.0–8.0)

## 2021-11-24 LAB — VALPROIC ACID LEVEL: Valproic Acid Lvl: 30 ug/mL — ABNORMAL LOW (ref 50.0–100.0)

## 2021-11-24 LAB — AMMONIA: Ammonia: 22 umol/L (ref 9–35)

## 2021-11-24 LAB — TSH: TSH: 2.25 u[IU]/mL (ref 0.320–4.118)

## 2021-11-24 MED ORDER — NYSTATIN 100000 UNIT/GM EX POWD: 1.0000 "application " | Freq: Three times a day (TID) | CUTANEOUS | 1 refills | Status: AC

## 2021-11-24 NOTE — Progress Notes (Signed)
? ?  ?Palliative Medicine ?[facility]  ?[phone] [phone] ? ? ?Name: Jeff Beier Sr. ?Date: 11/24/2021 ?MRN: [id number]  ?DOB: Aug 03, 1956 ? ?Patient Care Team: ?Pcp, No as PCP - General  ? ? ?REASON FOR CONSULTATION: ?Jeff Wood Sr. is a 66 y.o. 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 Annette Stable. Palliative ask to see for ongoing support and goals of care.  ? ? ?SOCIAL HISTORY:    ? reports that he has never smoked. He has never used smokeless tobacco. He reports that he does not currently use alcohol. He reports that he does not use drugs. ? ?ADVANCE DIRECTIVES:  ?Jeff Wood has a documented advanced directive on file. I have personally reviewed and discussed with family. His documented wishes identify Jeff Wood as his medical decision maker. Introduction to MOST form with plans to complete at next visit.  ? ?CODE STATUS: DNR ? ?PAST MEDICAL HISTORY: ?Past Medical History:  ?Diagnosis Date  ? Morbid obesity ([place]) 10/17/2021  ? ? ?PAST SURGICAL HISTORY:  ?Past Surgical History:  ?Procedure Laterality Date  ? APPLICATION OF CRANIAL NAVIGATION Left 10/20/2021  ? Procedure: APPLICATION OF CRANIAL NAVIGATION;  Surgeon: Earnie Larsson, MD;  Location: [place];  Service: Neurosurgery;  Laterality: Left;  ? CRANIOTOMY Left 10/20/2021  ? Procedure: Left temporal craniotomy for tumor with Brain Lab;  Surgeon: Earnie Larsson, MD;  Location: [place];  Service: Neurosurgery;  Laterality: Left;  ? RADIOLOGY WITH ANESTHESIA N/A 10/18/2021  ? Procedure: MRI WITH ANESTHESIA;  Surgeon: Radiologist, Medication, MD;  Location: [place];  Service: Radiology;  Laterality: N/A;  ? ? ?HEMATOLOGY/ONCOLOGY HISTORY:  ?Oncology History  ?Glioblastoma, IDH-wildtype ([place])  ?10/20/2021 Surgery  ? Craniotomy, resection of L temporal mass by Dr. Annette Stable.  Path demonstrates Glioblastoma IDHwt. ?  ?11/23/2021 -  Chemotherapy  ? Patient is on Treatment Plan :  BRAIN GLIOBLASTOMA Radiation Therapy With Concurrent Temozolomide 75 mg/m2 Daily Followed By Sequential Maintenance Temozolomide x 6-12 cycles  ?   ? ? ?ALLERGIES:  is allergic to iodine and shellfish allergy. ? ?MEDICATIONS:  ?Current Outpatient Medications  ?Medication Sig Dispense Refill  ? nystatin (MYCOSTATIN/NYSTOP) powder Apply 1 application. topically 3 (three) times daily. 60 g 1  ? acetaminophen (TYLENOL) 325 MG tablet Take 2 tablets (650 mg total) by mouth every 6 (six) hours as needed for mild pain (or Fever >/= 101).    ? albuterol (VENTOLIN HFA) 108 (90 Base) MCG/ACT inhaler Inhale 1-2 puffs into the lungs every 6 (six) hours as needed for wheezing or shortness of breath. 8.5 g 0  ? apixaban (ELIQUIS) 5 MG TABS tablet Take 1 tablet (5 mg total) by mouth 2 (two) times daily. 60 tablet 0  ? atorvastatin (LIPITOR) 40 MG tablet Take 1 tablet (40 mg total) by mouth daily. 30 tablet 0  ? Cholecalciferol (VITAMIN D3 GUMMIES PO) Take 2 tablets by mouth every evening.    ? diazepam (VALIUM) 5 MG tablet Take 1 tablet by mouth 45 minutes prior to MRI scans. 2 tablet 0  ? diclofenac Sodium (VOLTAREN) 1 % GEL Apply 2 g topically 4 (four) times daily. 200 g 0  ? divalproex (DEPAKOTE) 500 MG DR tablet Take 2 tablets (1,000 mg total) by mouth 2 (two) times daily. 120 tablet 3  ? famotidine (PEPCID) 20 MG tablet Take 1 tablet (20 mg total) by mouth daily. 30 tablet 0  ? fesoterodine (TOVIAZ) 4 MG TB24 tablet Take 1  tablet (4 mg total) by mouth daily. 30 tablet 0  ? fluconazole (DIFLUCAN) 100 MG tablet Take 2 tablets today, then 1 tablet daily x 13 more days. Hold Atorvastatin while on this drug. 15 tablet 0  ? fluticasone (FLONASE) 50 MCG/ACT nasal spray Place 2 sprays into both nostrils in the morning and at bedtime.    ? Fluticasone-Umeclidin-Vilant (TRELEGY ELLIPTA IN) Inhale 1 puff into the lungs daily.    ? furosemide (LASIX) 40 MG tablet TAKE 1 TABLET(40 MG) BY MOUTH DAILY 90 tablet 1  ? gemfibrozil (LOPID) 600  MG tablet Take 1 tablet (600 mg total) by mouth 2 (two) times daily before a meal. 60 tablet 0  ? insulin aspart (NOVOLOG FLEXPEN) 100 UNIT/ML FlexPen Inject 8 Units into the skin 3 (three) times daily with meals. (Patient not taking: Reported on 11/09/2021) 15 mL 11  ? insulin glargine (LANTUS) 100 UNIT/ML Solostar Pen Inject 30 Units into the skin 2 (two) times daily. 15 mL 11  ? Insulin Pen Needle (PEN NEEDLES) 32G X 4 MM MISC Use as directed 100 each 0  ? isosorbide mononitrate (IMDUR) 30 MG 24 hr tablet Take 1 tablet (30 mg total) by mouth daily. 30 tablet 0  ? metoprolol tartrate (LOPRESSOR) 100 MG tablet Take 1 tablet (100 mg total) by mouth 2 (two) times daily. 60 tablet 0  ? montelukast (SINGULAIR) 10 MG tablet Take 1 tablet (10 mg total) by mouth every evening. 30 tablet 0  ? Multiple Vitamins-Minerals (MENS MULTIVITAMIN PO) Take 1 tablet by mouth daily.    ? nitroGLYCERIN (NITROSTAT) 0.4 MG SL tablet Place 0.4 mg under the tongue every 5 (five) minutes as needed for chest pain.    ? ondansetron (ZOFRAN) 8 MG tablet Take 1 tablet (8 mg total) by mouth 2 (two) times daily as needed (nausea and vomiting). May take 30-60 minutes prior to Temodar administration if nausea/vomiting occurs. 30 tablet 1  ? oxyCODONE-acetaminophen (PERCOCET/ROXICET) 5-325 MG tablet Take 2 tablets by mouth every 6 (six) hours as needed for severe pain. (Patient not taking: Reported on 11/10/2021) 30 tablet 0  ? QUEtiapine (SEROQUEL) 200 MG tablet Take 1 tablet (200 mg total) by mouth at bedtime. 30 tablet 0  ? temozolomide (TEMODAR) 180 MG capsule Take 1 capsule (180 mg total) by mouth daily. May take on an empty stomach to decrease nausea & vomiting. 42 capsule 0  ? topiramate (TOPAMAX) 25 MG tablet Take 1 tablet (25 mg total) by mouth at bedtime. (Patient not taking: Reported on 11/10/2021) 30 tablet 0  ? ?No current facility-administered medications for this visit.  ? ? ?PERFORMANCE STATUS (ECOG) : 2 - Symptomatic, <50% confined to  bed ? ?Physical Exam ?General: NAD, sitting in wheelchair  ?Cardiovascular: regular rate and rhythm ?Pulmonary: normal breathing pattern  ?Abdomen: soft, nontender, + bowel sounds ?Extremities: bilateral lower extremity 2+ edema, wrapped in coban ?Skin: left healed cranial scarring, yeast in abdomen folds ?Neurological: Weakness, alert and oriented to self and family. Able to answer some questions appropriately but with poor insight.  ? ?IMPRESSION: ?Jeff Wood and his family (wife and daughter-Crystal) presented to the clinic today for follow-up. Jeff Wood is ambulating with his wife's walker to and from the restroom. Assisted back to his wheelchair. Follows commands with some prompting. Family expresses he does ambulate around the home withouth assistive devices.  ? ?Jeff Wood has encountered several episodes over the past week. His family reports he is not sleeping well at night, will make sudden behavior  changes with no reasoning or reference to his safety. Inappropriate use of the restroom in places such as kitchen. He is easily reoriented at times and at other times he can be somewhat aggressive in his response.  ? ?Appetite is good. Denies pain. He has not showered due to refusal and also refuses assistance at times. Wife and family are becoming somewhat exhausted and concerned about his decline. Emotional support offered. He was also seen by Dr. Mickeal Skinner on today. Per family direct and open discussions were had. Patient has expressed on multiple occasions he does not wish to continue with treatment. Daughter reports it is an effort to get him ready and encouraged to come to his daily radiation treatments.  ? ?I created space and opportunity for family to discuss and share feelings, thoughts, and concerns. Emotional support provided. Jeff Wood states he does not wish to continue with treatment. He told his family on yesterday on multiple occasions that this was his desire. He also expressed he wished hed would pass  away and be better off! I emphasized although patient has some alterations in his understanding it is clear he does not wish to continue with aggressive treatments. I discussed with family sometimes giv

## 2021-11-24 NOTE — Progress Notes (Signed)
? ?[facility] [address]  ?[place], [place] [address] ?[phone] ? ? ?Interval Evaluation ? ?Date of Service: 11/24/21 ?Patient Name: Jeff Peddie Sr. ?Patient MRN: [id number] ?Patient DOB: [birth year] ?Provider: Ventura Sellers, MD ? ?Identifying Statement:  ?Jeff Halls Sr. is a 66 y.o. male with left temporal glioblastoma  ? ?Oncologic History: ?Oncology History  ?Glioblastoma, IDH-wildtype ([place])  ?10/20/2021 Surgery  ? Craniotomy, resection of L temporal mass by Dr. Annette Stable.  Path demonstrates Glioblastoma IDHwt. ?  ?11/23/2021 -  Chemotherapy  ? Patient is on Treatment Plan : BRAIN GLIOBLASTOMA Radiation Therapy With Concurrent Temozolomide 75 mg/m2 Daily Followed By Sequential Maintenance Temozolomide x 6-12 cycles  ?   ? ? ?Biomarkers: ? ?MGMT Unknown.  ?IDH 1/2 Wild type.  ?EGFR Unknown  ?TERT Unknown  ? ?Interval History: ?Jeff Barberi Sr. And family describe notable decline over past few days. He has been irritable and difficult when awake, combative and spitting out pills.  At night he is unable to sleep and has been keeping family awake.  Yesterday he removed his shirt in the parking lot and tried to drive the car away.  It has been extremely difficult to get him in for therapy, and he expresses a desire to stop radiation treatments. ? ?H+P (11/09/21) Presented to medical attention in mid-February with sudden onset inability to speak or communicate.  Patient had been in normal state of health just prior.  In ED, workup revealed enhancing mass in the left temporal lobe consistent with primary brain tumor.  He underwent craniotomy, resection with Dr. Annette Stable on 10/20/21; path demonstrated glioblastoma.  Following surgery, there were deficits with language, right sided weakness, prompting rehab admission.  Since discharge from rehab, there have been issues with irritability, anger, mood swings, poor sleep.  He is dosing decadron $RemoveBeforeD'2mg'qSLUhvOjFQhZGb$  twice per day, Keppra $RemoveBef'1000mg'RtMDidZPyX$   twice per day.  Walking mostly on his own, though does use a walker at times.  He is frustrated about not driving or shooting his guns.  Currently living with his wife and daughter who just moved down to [place].  ? ?Medications: ?Current Outpatient Medications on File Prior to Visit  ?Medication Sig Dispense Refill  ? acetaminophen (TYLENOL) 325 MG tablet Take 2 tablets (650 mg total) by mouth every 6 (six) hours as needed for mild pain (or Fever >/= 101).    ? albuterol (VENTOLIN HFA) 108 (90 Base) MCG/ACT inhaler Inhale 1-2 puffs into the lungs every 6 (six) hours as needed for wheezing or shortness of breath. 8.5 g 0  ? apixaban (ELIQUIS) 5 MG TABS tablet Take 1 tablet (5 mg total) by mouth 2 (two) times daily. 60 tablet 0  ? atorvastatin (LIPITOR) 40 MG tablet Take 1 tablet (40 mg total) by mouth daily. 30 tablet 0  ? Cholecalciferol (VITAMIN D3 GUMMIES PO) Take 2 tablets by mouth every evening.    ? diazepam (VALIUM) 5 MG tablet Take 1 tablet by mouth 45 minutes prior to MRI scans. 2 tablet 0  ? diclofenac Sodium (VOLTAREN) 1 % GEL Apply 2 g topically 4 (four) times daily. 200 g 0  ? divalproex (DEPAKOTE) 500 MG DR tablet Take 2 tablets (1,000 mg total) by mouth 2 (two) times daily. 120 tablet 3  ? famotidine (PEPCID) 20 MG tablet Take 1 tablet (20 mg total) by mouth daily. 30 tablet 0  ? fesoterodine (TOVIAZ) 4 MG TB24 tablet Take 1 tablet (4 mg total) by mouth daily.  30 tablet 0  ? fluconazole (DIFLUCAN) 100 MG tablet Take 2 tablets today, then 1 tablet daily x 13 more days. Hold Atorvastatin while on this drug. 15 tablet 0  ? fluticasone (FLONASE) 50 MCG/ACT nasal spray Place 2 sprays into both nostrils in the morning and at bedtime.    ? Fluticasone-Umeclidin-Vilant (TRELEGY ELLIPTA IN) Inhale 1 puff into the lungs daily.    ? furosemide (LASIX) 40 MG tablet TAKE 1 TABLET(40 MG) BY MOUTH DAILY 90 tablet 1  ? gemfibrozil (LOPID) 600 MG tablet Take 1 tablet (600 mg total) by mouth 2 (two) times daily  before a meal. 60 tablet 0  ? insulin aspart (NOVOLOG FLEXPEN) 100 UNIT/ML FlexPen Inject 8 Units into the skin 3 (three) times daily with meals. (Patient not taking: Reported on 11/09/2021) 15 mL 11  ? insulin glargine (LANTUS) 100 UNIT/ML Solostar Pen Inject 30 Units into the skin 2 (two) times daily. 15 mL 11  ? Insulin Pen Needle (PEN NEEDLES) 32G X 4 MM MISC Use as directed 100 each 0  ? isosorbide mononitrate (IMDUR) 30 MG 24 hr tablet Take 1 tablet (30 mg total) by mouth daily. 30 tablet 0  ? metoprolol tartrate (LOPRESSOR) 100 MG tablet Take 1 tablet (100 mg total) by mouth 2 (two) times daily. 60 tablet 0  ? montelukast (SINGULAIR) 10 MG tablet Take 1 tablet (10 mg total) by mouth every evening. 30 tablet 0  ? Multiple Vitamins-Minerals (MENS MULTIVITAMIN PO) Take 1 tablet by mouth daily.    ? nitroGLYCERIN (NITROSTAT) 0.4 MG SL tablet Place 0.4 mg under the tongue every 5 (five) minutes as needed for chest pain.    ? ondansetron (ZOFRAN) 8 MG tablet Take 1 tablet (8 mg total) by mouth 2 (two) times daily as needed (nausea and vomiting). May take 30-60 minutes prior to Temodar administration if nausea/vomiting occurs. 30 tablet 1  ? oxyCODONE-acetaminophen (PERCOCET/ROXICET) 5-325 MG tablet Take 2 tablets by mouth every 6 (six) hours as needed for severe pain. (Patient not taking: Reported on 11/10/2021) 30 tablet 0  ? QUEtiapine (SEROQUEL) 200 MG tablet Take 1 tablet (200 mg total) by mouth at bedtime. 30 tablet 0  ? temozolomide (TEMODAR) 180 MG capsule Take 1 capsule (180 mg total) by mouth daily. May take on an empty stomach to decrease nausea & vomiting. 42 capsule 0  ? topiramate (TOPAMAX) 25 MG tablet Take 1 tablet (25 mg total) by mouth at bedtime. (Patient not taking: Reported on 11/10/2021) 30 tablet 0  ? ?No current facility-administered medications on file prior to visit.  ? ? ?Allergies:  ?Allergies  ?Allergen Reactions  ? Iodine Anaphylaxis  ? Shellfish Allergy Anaphylaxis  ? ?Past Medical  History:  ?Past Medical History:  ?Diagnosis Date  ? Morbid obesity ([place]) 10/17/2021  ? ?Past Surgical History:  ?Past Surgical History:  ?Procedure Laterality Date  ? APPLICATION OF CRANIAL NAVIGATION Left 10/20/2021  ? Procedure: APPLICATION OF CRANIAL NAVIGATION;  Surgeon: Earnie Larsson, MD;  Location: [facility];  Service: Neurosurgery;  Laterality: Left;  ? CRANIOTOMY Left 10/20/2021  ? Procedure: Left temporal craniotomy for tumor with Brain Lab;  Surgeon: Earnie Larsson, MD;  Location: [facility];  Service: Neurosurgery;  Laterality: Left;  ? RADIOLOGY WITH ANESTHESIA N/A 10/18/2021  ? Procedure: MRI WITH ANESTHESIA;  Surgeon: Radiologist, Medication, MD;  Location: [facility];  Service: Radiology;  Laterality: N/A;  ? ?Social History:  ?Social History  ? ?Socioeconomic History  ? Marital status: Married  ?  Spouse  name: Not on file  ? Number of children: Not on file  ? Years of education: Not on file  ? Highest education level: Not on file  ?Occupational History  ? Not on file  ?Tobacco Use  ? Smoking status: Never  ? Smokeless tobacco: Never  ?Vaping Use  ? Vaping Use: Never used  ?Substance and Sexual Activity  ? Alcohol use: Not Currently  ? Drug use: Never  ? Sexual activity: Not Currently  ?Other Topics Concern  ? Not on file  ?Social History Narrative  ? Not on file  ? ?Social Determinants of Health  ? ?Financial Resource Strain: Not on file  ?Food Insecurity: Not on file  ?Transportation Needs: Not on file  ?Physical Activity: Not on file  ?Stress: Not on file  ?Social Connections: Not on file  ?Intimate Partner Violence: Not on file  ? ?Family History: No family history on file. ? ?Review of Systems: ?Limited by dyphasia ? ?Physical Exam: ?Vitals:  ? 11/24/21 1039  ?BP: (!) 143/97  ?Pulse: 81  ?Resp: 19  ?Temp: 98.1 ?F (36.7 ?C)  ?SpO2: 98%  ? ?KPS: 70. ?General: Obese habitus ?Head: Normal ?EENT: No conjunctival injection or scleral icterus.  ?Lungs: Resp effort normal ?Cardiac: Regular rate ?Abdomen: Non-distended  abdomen ?Skin: No rashes cyanosis or petechiae. ?Extremities: +++LE edema bilateral ? ?Neurologic Exam: ?Mental Status: Awake, alert, not attentive to examiner. Oriented to self and environment. Language is impaired wi

## 2021-11-25 ENCOUNTER — Encounter: Payer: Self-pay | Admitting: Internal Medicine

## 2021-11-25 ENCOUNTER — Other Ambulatory Visit: Payer: Self-pay | Admitting: Nurse Practitioner

## 2021-11-25 ENCOUNTER — Other Ambulatory Visit (HOSPITAL_COMMUNITY): Payer: Self-pay

## 2021-11-25 ENCOUNTER — Telehealth: Payer: Self-pay | Admitting: Nurse Practitioner

## 2021-11-25 ENCOUNTER — Ambulatory Visit: Payer: Medicare Other

## 2021-11-25 DIAGNOSIS — C719 Malignant neoplasm of brain, unspecified: Secondary | ICD-10-CM

## 2021-11-25 NOTE — Telephone Encounter (Signed)
Received call from patient's wife and daughter expressing after family discussion they would like to discontinue all treatments and focus on Jeff Wood comfort.  ? ?Jeff Wood has expressed wishes that he did not want to continue with treatments and family would like to honor this. They also are realistic in their understanding of his poor prognosis and ongoing decline.  ? ?Re-educated family on hospice's goals and philosophy of care as well as referral process. They verbalized understanding confirming wishes to proceed with referral.  ? ?Referral has been placed with [facility] and family knows their team will be in contact for next steps.  ? ?Family aware the medical team here is available if needed.  ?

## 2021-11-26 ENCOUNTER — Ambulatory Visit: Payer: Medicare Other

## 2021-11-27 ENCOUNTER — Ambulatory Visit: Payer: Medicare Other

## 2021-11-30 ENCOUNTER — Ambulatory Visit: Payer: Medicare Other

## 2021-12-01 ENCOUNTER — Inpatient Hospital Stay: Payer: Medicare Other | Admitting: Nurse Practitioner

## 2021-12-01 ENCOUNTER — Ambulatory Visit: Payer: Medicare Other

## 2021-12-02 ENCOUNTER — Ambulatory Visit: Payer: Medicare Other

## 2021-12-03 ENCOUNTER — Other Ambulatory Visit (HOSPITAL_COMMUNITY): Payer: Self-pay

## 2021-12-03 ENCOUNTER — Inpatient Hospital Stay: Payer: Medicare Other

## 2021-12-03 ENCOUNTER — Inpatient Hospital Stay: Payer: Medicare Other | Admitting: Internal Medicine

## 2021-12-03 ENCOUNTER — Ambulatory Visit: Payer: Medicare Other

## 2021-12-04 ENCOUNTER — Ambulatory Visit: Payer: Medicare Other

## 2021-12-07 ENCOUNTER — Ambulatory Visit: Payer: Medicare Other

## 2021-12-07 ENCOUNTER — Other Ambulatory Visit (HOSPITAL_COMMUNITY): Payer: Self-pay

## 2021-12-08 ENCOUNTER — Ambulatory Visit: Payer: Medicare Other

## 2021-12-09 ENCOUNTER — Ambulatory Visit: Payer: Medicare Other

## 2021-12-10 ENCOUNTER — Ambulatory Visit: Payer: Medicare Other

## 2021-12-11 ENCOUNTER — Ambulatory Visit: Payer: Medicare Other

## 2021-12-14 ENCOUNTER — Ambulatory Visit: Payer: Medicare Other

## 2021-12-15 ENCOUNTER — Ambulatory Visit: Payer: Medicare Other

## 2021-12-16 ENCOUNTER — Ambulatory Visit: Payer: Medicare Other

## 2021-12-17 ENCOUNTER — Inpatient Hospital Stay: Payer: Medicare Other

## 2021-12-17 ENCOUNTER — Ambulatory Visit: Payer: Medicare Other

## 2021-12-17 ENCOUNTER — Inpatient Hospital Stay: Payer: Medicare Other | Admitting: Internal Medicine

## 2021-12-18 ENCOUNTER — Ambulatory Visit: Payer: Medicare Other

## 2021-12-21 ENCOUNTER — Ambulatory Visit: Payer: Medicare Other

## 2021-12-22 ENCOUNTER — Ambulatory Visit: Payer: Medicare Other

## 2021-12-23 ENCOUNTER — Ambulatory Visit: Payer: Medicare Other

## 2021-12-24 ENCOUNTER — Ambulatory Visit: Payer: Medicare Other

## 2021-12-25 ENCOUNTER — Ambulatory Visit: Payer: Medicare Other

## 2021-12-28 ENCOUNTER — Ambulatory Visit: Payer: Medicare Other

## 2021-12-28 DEATH — deceased

## 2021-12-29 ENCOUNTER — Ambulatory Visit: Payer: Medicare Other

## 2021-12-30 ENCOUNTER — Ambulatory Visit: Payer: Medicare Other

## 2021-12-31 ENCOUNTER — Inpatient Hospital Stay: Payer: Medicare Other | Admitting: Internal Medicine

## 2021-12-31 ENCOUNTER — Inpatient Hospital Stay: Payer: Medicare Other

## 2021-12-31 ENCOUNTER — Ambulatory Visit: Payer: Medicare Other

## 2022-01-01 ENCOUNTER — Ambulatory Visit: Payer: Medicare Other

## 2022-01-20 NOTE — Progress Notes (Signed)
End of Treatment Note  Diagnosis:C71.2 - Malignant neoplasm of temporal lobe, Diagnosed 11/10/2021 (Active)  Cancer Staging: WHO GRADE IV glioblastoma  Indication for Treatment: curative  Radiation Treatment Dates: 11-23-21 to 11-24-21  He received 4 Gy in 2 fractions to the partial brain. Treatment then stopped prematurely due to declining clinical status. The family elected to enroll Mr Elman on hospice.   Technique: IMRT  Beam Energy: 6MV  PLAN: Follow with hospice, return to rad/onc PRN.   -----------------------------------  Eppie Gibson, MD
